# Patient Record
Sex: Female | Born: 1975 | Race: Black or African American | Hispanic: No | Marital: Married | Smoking: Former smoker
Health system: Southern US, Community
[De-identification: ages and names within clinical notes are randomized; demographics above are authoritative.]

## PROBLEM LIST (undated history)

## (undated) DIAGNOSIS — K219 Gastro-esophageal reflux disease without esophagitis: Secondary | ICD-10-CM

## (undated) DIAGNOSIS — F419 Anxiety disorder, unspecified: Secondary | ICD-10-CM

## (undated) DIAGNOSIS — I10 Essential (primary) hypertension: Secondary | ICD-10-CM

## (undated) HISTORY — DX: Anxiety disorder, unspecified: F41.9

## (undated) HISTORY — DX: Gastro-esophageal reflux disease without esophagitis: K21.9

---

## 2005-11-11 LAB — HM PAP SMEAR: HM PAP: NORMAL

## 2007-06-22 ENCOUNTER — Emergency Department: Payer: Self-pay | Admitting: Emergency Medicine

## 2012-03-22 ENCOUNTER — Emergency Department: Payer: Self-pay | Admitting: Emergency Medicine

## 2012-03-22 LAB — BASIC METABOLIC PANEL
Anion Gap: 6 — ABNORMAL LOW (ref 7–16)
BUN: 16 mg/dL (ref 7–18)
Co2: 28 mmol/L (ref 21–32)
EGFR (African American): >60
Osmolality: 285 (ref 275–301)

## 2012-03-22 LAB — URINALYSIS, COMPLETE
Bacteria: NONE SEEN
Blood: NEGATIVE
Glucose,UR: NEGATIVE mg/dL (ref 0–75)
Hyaline Cast: 4
Ketone: NEGATIVE
Leukocyte Esterase: NEGATIVE
Ph: 5 (ref 4.5–8.0)
Protein: 30
RBC,UR: 1 /HPF (ref 0–5)
Specific Gravity: 1.036 (ref 1.003–1.030)
Squamous Epithelial: 3
WBC UR: 2 /HPF (ref 0–5)

## 2012-03-22 LAB — CBC WITH DIFFERENTIAL/PLATELET
Basophil %: 0.7 %
Eosinophil #: 0.8 10*3/uL — ABNORMAL HIGH (ref 0.0–0.7)
HGB: 10.7 g/dL — ABNORMAL LOW (ref 12.0–16.0)
Lymphocyte %: 39.3 %
MCH: 28.9 pg (ref 26.0–34.0)
MCHC: 32.7 g/dL (ref 32.0–36.0)
MCV: 88 fL (ref 80–100)
Monocyte %: 4.6 %
Neutrophil #: 4.1 10*3/uL (ref 1.4–6.5)
Neutrophil %: 46.6 %
Platelet: 326 10*3/uL (ref 150–440)
RBC: 3.71 10*6/uL — ABNORMAL LOW (ref 3.80–5.20)
RDW: 14.1 % (ref 11.5–14.5)
WBC: 8.7 10*3/uL (ref 3.6–11.0)

## 2012-03-22 LAB — PREGNANCY, URINE: Pregnancy Test, Urine: NEGATIVE m[IU]/mL

## 2012-03-23 LAB — DRUG SCREEN, URINE
Barbiturates, Ur Screen: NEGATIVE (ref ?–200)
Benzodiazepine, Ur Scrn: POSITIVE (ref ?–200)
Cocaine Metabolite,Ur ~~LOC~~: NEGATIVE (ref ?–300)
MDMA (Ecstasy)Ur Screen: NEGATIVE (ref ?–500)
Methadone, Ur Screen: NEGATIVE (ref ?–300)
Opiate, Ur Screen: POSITIVE (ref ?–300)
Phencyclidine (PCP) Ur S: NEGATIVE (ref ?–25)
Tricyclic, Ur Screen: NEGATIVE (ref ?–1000)

## 2012-03-23 LAB — HEPATIC FUNCTION PANEL A (ARMC)
Alkaline Phosphatase: 103 U/L (ref 50–136)
Bilirubin, Direct: <0.1 mg/dL (ref 0.00–0.20)
SGOT(AST): 14 U/L — ABNORMAL LOW (ref 15–37)
Total Protein: 7 g/dL (ref 6.4–8.2)

## 2012-03-23 LAB — SEDIMENTATION RATE: Erythrocyte Sed Rate: 6 mm/hr (ref 0–20)

## 2012-04-23 ENCOUNTER — Emergency Department: Payer: Self-pay | Admitting: Emergency Medicine

## 2012-04-23 LAB — CBC
HCT: 30.8 % — ABNORMAL LOW (ref 35.0–47.0)
HGB: 10.4 g/dL — ABNORMAL LOW (ref 12.0–16.0)
MCV: 86 fL (ref 80–100)
RBC: 3.59 10*6/uL — ABNORMAL LOW (ref 3.80–5.20)
WBC: 8.5 10*3/uL (ref 3.6–11.0)

## 2012-04-23 LAB — COMPREHENSIVE METABOLIC PANEL
Albumin: 3.3 g/dL — ABNORMAL LOW (ref 3.4–5.0)
BUN: 12 mg/dL (ref 7–18)
Calcium, Total: 8.8 mg/dL (ref 8.5–10.1)
Creatinine: 0.92 mg/dL (ref 0.60–1.30)
Potassium: 3.6 mmol/L (ref 3.5–5.1)
Sodium: 138 mmol/L (ref 136–145)

## 2012-04-23 LAB — TROPONIN I: Troponin-I: <0.02 ng/mL

## 2012-05-11 ENCOUNTER — Emergency Department: Payer: Self-pay | Admitting: Emergency Medicine

## 2012-05-11 LAB — CBC WITH DIFFERENTIAL/PLATELET
Eosinophil #: 0.6 10*3/uL (ref 0.0–0.7)
Eosinophil %: 8 %
HGB: 11.2 g/dL — ABNORMAL LOW (ref 12.0–16.0)
Lymphocyte #: 2.8 10*3/uL (ref 1.0–3.6)
Lymphocyte %: 37 %
MCH: 28.4 pg (ref 26.0–34.0)
MCHC: 32.5 g/dL (ref 32.0–36.0)
MCV: 88 fL (ref 80–100)
Monocyte %: 5 %
Neutrophil #: 3.7 10*3/uL (ref 1.4–6.5)
RBC: 3.94 10*6/uL (ref 3.80–5.20)
WBC: 7.4 10*3/uL (ref 3.6–11.0)

## 2012-05-11 LAB — DRUG SCREEN, URINE
Amphetamines, Ur Screen: NEGATIVE (ref ?–1000)
Barbiturates, Ur Screen: NEGATIVE (ref ?–200)
Benzodiazepine, Ur Scrn: NEGATIVE (ref ?–200)
Cannabinoid 50 Ng, Ur ~~LOC~~: NEGATIVE (ref ?–50)
Cocaine Metabolite,Ur ~~LOC~~: NEGATIVE (ref ?–300)
MDMA (Ecstasy)Ur Screen: NEGATIVE (ref ?–500)
Methadone, Ur Screen: NEGATIVE (ref ?–300)
Tricyclic, Ur Screen: NEGATIVE (ref ?–1000)

## 2012-05-11 LAB — URINALYSIS, COMPLETE
Bilirubin,UR: NEGATIVE
Glucose,UR: NEGATIVE mg/dL (ref 0–75)
Hyaline Cast: 28
Ketone: NEGATIVE
Ph: 8 (ref 4.5–8.0)
Squamous Epithelial: 31

## 2012-05-11 LAB — COMPREHENSIVE METABOLIC PANEL
BUN: 11 mg/dL (ref 7–18)
Bilirubin,Total: 0.3 mg/dL (ref 0.2–1.0)
Calcium, Total: 9.2 mg/dL (ref 8.5–10.1)
Chloride: 105 mmol/L (ref 98–107)
Co2: 23 mmol/L (ref 21–32)
Creatinine: 1.16 mg/dL (ref 0.60–1.30)
EGFR (African American): >60
EGFR (Non-African Amer.): 60 — ABNORMAL LOW
Osmolality: 278 (ref 275–301)

## 2012-05-11 LAB — PREGNANCY, URINE: Pregnancy Test, Urine: NEGATIVE m[IU]/mL

## 2015-09-23 ENCOUNTER — Other Ambulatory Visit: Payer: Self-pay | Admitting: Family Medicine

## 2015-09-29 ENCOUNTER — Other Ambulatory Visit: Payer: Self-pay | Admitting: Family Medicine

## 2015-09-30 ENCOUNTER — Other Ambulatory Visit: Payer: Self-pay | Admitting: Family Medicine

## 2016-11-14 DIAGNOSIS — I639 Cerebral infarction, unspecified: Secondary | ICD-10-CM

## 2016-11-14 HISTORY — DX: Cerebral infarction, unspecified: I63.9

## 2017-05-09 ENCOUNTER — Inpatient Hospital Stay (HOSPITAL_COMMUNITY)
Admission: AD | Admit: 2017-05-09 | Discharge: 2017-05-12 | DRG: 065 | Disposition: A | Discharge status: Home / Self Care | Payer: Medicaid Other | Source: Other Acute Inpatient Hospital | Attending: Neurology | Admitting: Neurology

## 2017-05-09 ENCOUNTER — Emergency Department: Payer: Medicaid Other

## 2017-05-09 ENCOUNTER — Encounter (HOSPITAL_COMMUNITY): Payer: Self-pay

## 2017-05-09 ENCOUNTER — Encounter: Payer: Self-pay | Admitting: Emergency Medicine

## 2017-05-09 ENCOUNTER — Emergency Department
Admission: EM | Admit: 2017-05-09 | Discharge: 2017-05-09 | Disposition: A | Discharge status: Other Acute Inpatient Hospital | Payer: Medicaid Other | Attending: Emergency Medicine | Admitting: Emergency Medicine

## 2017-05-09 DIAGNOSIS — I161 Hypertensive emergency: Secondary | ICD-10-CM | POA: Diagnosis not present

## 2017-05-09 DIAGNOSIS — F17210 Nicotine dependence, cigarettes, uncomplicated: Secondary | ICD-10-CM | POA: Diagnosis present

## 2017-05-09 DIAGNOSIS — I610 Nontraumatic intracerebral hemorrhage in hemisphere, subcortical: Secondary | ICD-10-CM

## 2017-05-09 DIAGNOSIS — I619 Nontraumatic intracerebral hemorrhage, unspecified: Secondary | ICD-10-CM | POA: Insufficient documentation

## 2017-05-09 DIAGNOSIS — E785 Hyperlipidemia, unspecified: Secondary | ICD-10-CM | POA: Diagnosis not present

## 2017-05-09 DIAGNOSIS — Z8249 Family history of ischemic heart disease and other diseases of the circulatory system: Secondary | ICD-10-CM | POA: Diagnosis not present

## 2017-05-09 DIAGNOSIS — I10 Essential (primary) hypertension: Secondary | ICD-10-CM | POA: Diagnosis not present

## 2017-05-09 DIAGNOSIS — R4781 Slurred speech: Secondary | ICD-10-CM | POA: Diagnosis not present

## 2017-05-09 DIAGNOSIS — T448X6A Underdosing of centrally-acting and adrenergic-neuron-blocking agents, initial encounter: Secondary | ICD-10-CM | POA: Diagnosis present

## 2017-05-09 DIAGNOSIS — Y92009 Unspecified place in unspecified non-institutional (private) residence as the place of occurrence of the external cause: Secondary | ICD-10-CM | POA: Diagnosis not present

## 2017-05-09 DIAGNOSIS — E119 Type 2 diabetes mellitus without complications: Secondary | ICD-10-CM | POA: Diagnosis present

## 2017-05-09 DIAGNOSIS — G8194 Hemiplegia, unspecified affecting left nondominant side: Secondary | ICD-10-CM | POA: Diagnosis present

## 2017-05-09 DIAGNOSIS — I639 Cerebral infarction, unspecified: Secondary | ICD-10-CM

## 2017-05-09 DIAGNOSIS — R29704 NIHSS score 4: Secondary | ICD-10-CM | POA: Diagnosis not present

## 2017-05-09 DIAGNOSIS — Z6841 Body Mass Index (BMI) 40.0 and over, adult: Secondary | ICD-10-CM | POA: Diagnosis not present

## 2017-05-09 DIAGNOSIS — R29810 Facial weakness: Secondary | ICD-10-CM | POA: Diagnosis present

## 2017-05-09 HISTORY — DX: Essential (primary) hypertension: I10

## 2017-05-09 LAB — COMPREHENSIVE METABOLIC PANEL
ALT: 9 U/L — ABNORMAL LOW (ref 14–54)
AST: 18 U/L (ref 15–41)
Albumin: 4 g/dL (ref 3.5–5.0)
Alkaline Phosphatase: 90 U/L (ref 38–126)
Anion gap: 8 (ref 5–15)
BILIRUBIN TOTAL: 0.4 mg/dL (ref 0.3–1.2)
BUN: 11 mg/dL (ref 6–20)
CO2: 24 mmol/L (ref 22–32)
Calcium: 9.1 mg/dL (ref 8.9–10.3)
Chloride: 105 mmol/L (ref 101–111)
Creatinine, Ser: 0.87 mg/dL (ref 0.44–1.00)
GFR calc Af Amer: >60 mL/min (ref >60)
Glucose, Bld: 102 mg/dL — ABNORMAL HIGH (ref 65–99)
POTASSIUM: 2.8 mmol/L — AB (ref 3.5–5.1)
Sodium: 137 mmol/L (ref 135–145)
TOTAL PROTEIN: 8 g/dL (ref 6.5–8.1)

## 2017-05-09 LAB — CBC
HEMATOCRIT: 31.7 % — AB (ref 35.0–47.0)
HEMOGLOBIN: 10.9 g/dL — AB (ref 12.0–16.0)
MCH: 29.6 pg (ref 26.0–34.0)
MCHC: 34.5 g/dL (ref 32.0–36.0)
MCV: 86 fL (ref 80.0–100.0)
Platelets: 302 10*3/uL (ref 150–440)
RBC: 3.69 MIL/uL — ABNORMAL LOW (ref 3.80–5.20)
RDW: 15.3 % — ABNORMAL HIGH (ref 11.5–14.5)
WBC: 6.5 10*3/uL (ref 3.6–11.0)

## 2017-05-09 LAB — DIFFERENTIAL
BASOS ABS: 0 10*3/uL (ref 0–0.1)
Basophils Relative: 1 %
EOS PCT: 10 %
Eosinophils Absolute: 0.6 10*3/uL (ref 0–0.7)
LYMPHS ABS: 1.6 10*3/uL (ref 1.0–3.6)
Lymphocytes Relative: 24 %
MONOS PCT: 7 %
Monocytes Absolute: 0.4 10*3/uL (ref 0.2–0.9)
Neutro Abs: 3.8 10*3/uL (ref 1.4–6.5)
Neutrophils Relative %: 58 %

## 2017-05-09 LAB — TROPONIN I

## 2017-05-09 LAB — GLUCOSE, CAPILLARY: Glucose-Capillary: 110 mg/dL — ABNORMAL HIGH (ref 65–99)

## 2017-05-09 LAB — APTT: aPTT: 25 seconds (ref 24–36)

## 2017-05-09 LAB — PROTIME-INR
INR: 1.01
Prothrombin Time: 13.3 seconds (ref 11.4–15.2)

## 2017-05-09 LAB — MRSA PCR SCREENING: MRSA by PCR: NEGATIVE

## 2017-05-09 MED ORDER — NICARDIPINE HCL IN NACL 20-0.86 MG/200ML-% IV SOLN: 3.0000 mg/h | Freq: Once | INTRAVENOUS | Status: DC

## 2017-05-09 MED ORDER — STROKE: EARLY STAGES OF RECOVERY BOOK
Freq: Once | Status: AC
  Administered 2017-05-09: 17:00:00
  Filled 2017-05-09: qty 1

## 2017-05-09 MED ORDER — NICARDIPINE HCL IN NACL 40-0.83 MG/200ML-% IV SOLN
3.0000 mg/h | INTRAVENOUS | Status: DC
Start: 2017-05-09 — End: 2017-05-11
  Administered 2017-05-09 – 2017-05-10 (×4): 15 mg/h via INTRAVENOUS
  Administered 2017-05-10: 9 mg/h via INTRAVENOUS
  Filled 2017-05-09 (×6): qty 200

## 2017-05-09 MED ORDER — LABETALOL HCL 5 MG/ML IV SOLN
INTRAVENOUS | Status: AC
  Filled 2017-05-09: qty 4

## 2017-05-09 MED ORDER — SODIUM CHLORIDE 0.9 % IV SOLN: INTRAVENOUS | Status: DC

## 2017-05-09 MED ORDER — LABETALOL HCL 5 MG/ML IV SOLN
10.0000 mg | INTRAVENOUS | Status: DC | PRN
  Administered 2017-05-09 – 2017-05-12 (×7): 10 mg via INTRAVENOUS
  Filled 2017-05-09 (×6): qty 4

## 2017-05-09 MED ORDER — SENNOSIDES-DOCUSATE SODIUM 8.6-50 MG PO TABS
1.0000 | ORAL_TABLET | Freq: Two times a day (BID) | ORAL | Status: DC
  Administered 2017-05-09 – 2017-05-12 (×6): 1 via ORAL
  Filled 2017-05-09 (×7): qty 1

## 2017-05-09 MED ORDER — CLEVIDIPINE BUTYRATE 0.5 MG/ML IV EMUL: 0.0000 mg/h | INTRAVENOUS | Status: DC

## 2017-05-09 MED ORDER — ACETAMINOPHEN 650 MG RE SUPP: 650.0000 mg | RECTAL | Status: DC | PRN

## 2017-05-09 MED ORDER — CLONIDINE HCL 0.1 MG PO TABS: 0.1000 mg | ORAL_TABLET | Freq: Two times a day (BID) | ORAL | Status: DC

## 2017-05-09 MED ORDER — LABETALOL HCL 5 MG/ML IV SOLN
20.0000 mg | Freq: Once | INTRAVENOUS | Status: AC
  Administered 2017-05-09: 20 mg via INTRAVENOUS

## 2017-05-09 MED ORDER — ACETAMINOPHEN 160 MG/5ML PO SOLN: 650.0000 mg | ORAL | Status: DC | PRN

## 2017-05-09 MED ORDER — ACETAMINOPHEN 325 MG PO TABS: 650.0000 mg | ORAL_TABLET | ORAL | Status: DC | PRN

## 2017-05-09 MED ORDER — NICARDIPINE HCL IN NACL 20-0.86 MG/200ML-% IV SOLN
INTRAVENOUS | Status: AC
  Filled 2017-05-09: qty 200

## 2017-05-09 MED ORDER — PANTOPRAZOLE SODIUM 40 MG IV SOLR: 40.0000 mg | Freq: Every day | INTRAVENOUS | Status: DC

## 2017-05-09 MED ORDER — NICARDIPINE HCL IN NACL 20-0.86 MG/200ML-% IV SOLN
3.0000 mg/h | INTRAVENOUS | Status: DC
  Administered 2017-05-09: 5 mg/h via INTRAVENOUS
  Administered 2017-05-09 (×2): 15 mg/h via INTRAVENOUS
  Filled 2017-05-09: qty 200

## 2017-05-09 MED ORDER — NICARDIPINE HCL IN NACL 20-0.86 MG/200ML-% IV SOLN
3.0000 mg/h | INTRAVENOUS | Status: DC
Start: 2017-05-09 — End: 2017-05-09
  Administered 2017-05-09: 15 mg/h via INTRAVENOUS

## 2017-05-09 MED ORDER — CLONIDINE HCL 0.1 MG PO TABS
0.1000 mg | ORAL_TABLET | Freq: Two times a day (BID) | ORAL | Status: DC
  Administered 2017-05-09: 0.1 mg via ORAL
  Filled 2017-05-09: qty 1

## 2017-05-09 MED ORDER — NICARDIPINE HCL IN NACL 20-0.86 MG/200ML-% IV SOLN
INTRAVENOUS | Status: AC
  Administered 2017-05-09: 5 mg/h via INTRAVENOUS
  Filled 2017-05-09: qty 200

## 2017-05-09 MED ORDER — PANTOPRAZOLE SODIUM 40 MG PO TBEC
40.0000 mg | DELAYED_RELEASE_TABLET | Freq: Every day | ORAL | Status: DC
  Administered 2017-05-09 – 2017-05-12 (×4): 40 mg via ORAL
  Filled 2017-05-09 (×4): qty 1

## 2017-05-09 MED ORDER — LABETALOL HCL 5 MG/ML IV SOLN
10.0000 mg | Freq: Once | INTRAVENOUS | Status: AC
  Administered 2017-05-09: 10 mg via INTRAVENOUS
  Filled 2017-05-09: qty 4

## 2017-05-09 MED ORDER — NICARDIPINE HCL IN NACL 20-0.86 MG/200ML-% IV SOLN
INTRAVENOUS | Status: AC
  Administered 2017-05-09: 20 mg
  Filled 2017-05-09: qty 200

## 2017-05-09 MED ORDER — LORAZEPAM 2 MG/ML IJ SOLN
1.0000 mg | Freq: Once | INTRAMUSCULAR | Status: AC
  Administered 2017-05-09: 1 mg via INTRAVENOUS
  Filled 2017-05-09: qty 1
  Filled 2017-05-09 (×2): qty 0.5

## 2017-05-09 NOTE — ED Notes (Signed)
Dr Thad Rangerreynolds notified of bp 193/99 on 15 ml/hr of cardene for 15 min. Orders for 10 mg labetalol given

## 2017-05-09 NOTE — Progress Notes (Signed)
Dr. Cherylynn RidgesShikhman called back and said it is ok not to treat the blood pressure in the 140's.

## 2017-05-09 NOTE — Progress Notes (Signed)
Patient admitted from [facility]. Patient alert and oriented x 4. Patient oriented to room and made comfortable. MD page to notify that patient is on the unit.

## 2017-05-09 NOTE — ED Notes (Signed)
From CT to room. Dr Doy Mince met pt at [place]

## 2017-05-09 NOTE — ED Notes (Signed)
Report given to [facility]. 

## 2017-05-09 NOTE — Progress Notes (Signed)
[facility] Physician-Brief Progress Note Patient Name: Diane Keller DOB: 08/20/1976 MRN: [id number]   Date of Service  05/09/2017  HPI/Events of Note  41 yo woma with poorly controlled HTN, admitted with ICU. Currently stable on camera, BP 136/88. No new interventions for now  eICU Interventions       Intervention Category Evaluation Type: New Patient Evaluation  [name] S. 05/09/2017, 3:51 PM

## 2017-05-09 NOTE — Consult Note (Signed)
Neurosurgery-New Consultation Evaluation 05/09/2017 Diane Keller [id number]  Identifying Statement: Diane Keller is a 41 y.o. female from [place] [address] with intracranial hemorrhage  Physician Requesting Consultation: Daryel November, MD - Emergency Department  History of Present Illness: Diane Keller presented to ED today after having slurred speech this morning. She states she noticed this only this morning and then noted she had some facial weakness. She has no history of similar symptoms. She also noted some weakness on the left side of her body but denies numbness. She denies any headache  She has no history of anticoagulant or antiplatelet use. She has no history of liver disease or bleeding disorder. She was found to be hypertensive on arrival to the ED. She does smoke occasionally but denies alcohol or other drug use.  CT imaging of the head revealed a small hemorrhage on the right in the subcortical/basal ganglia area.   Past Medical History:  Past Medical History:  Diagnosis Date  . Hypertension     Social History: Social History   Social History  . Marital status: Single    Spouse name: N/A  . Number of children: N/A  . Years of education: N/A   Occupational History  . Not on file.   Social History Main Topics  . Smoking status: Current Every Day Smoker  . Smokeless tobacco: Never Used  . Alcohol use Yes  . Drug use: No  . Sexual activity: Not on file   Other Topics Concern  . Not on file   Social History Narrative  . No narrative on file   Living arrangements (living alone, with partner): Lives in [place]  Family History: History reviewed. No pertinent family history.  Review of Systems:  Review of Systems - General ROS: Negative Psychological ROS: Negative Ophthalmic ROS: Negative ENT ROS: Negative Hematological and Lymphatic ROS: Negative  Endocrine ROS: Negative Respiratory ROS: Negative Cardiovascular ROS:  Negative Gastrointestinal ROS: Negative Genito-Urinary ROS: Negative Musculoskeletal ROS: Negative Neurological ROS: Negative for headache, Positive for weakness. Negative for numbness Dermatological ROS: Negative  Physical Exam: BP (!) 147/84   Pulse 90   Temp 98.4 F (36.9 C) (Oral)   Resp (!) 28   Ht 5\' 8"  (1.727 m)   Wt 113.4 kg (250 lb)   LMP 05/04/2017   SpO2 94%   BMI 38.01 kg/m  Body mass index is 38.01 kg/m. Body surface area is 2.33 meters squared. General appearance: Alert, cooperative, lying supine, appears anxious Head: Normocephalic, atraumatic Eyes: Normal, EOM intact Oropharynx: Moist without lesions Ext: No edema in LE bilaterally  Neurologic exam:  Mental status: alertness: alert, orientation: person, place, time, affect: normal Speech: fluent but mild dysarthria, naming intact Cranial nerves:  II: Visual fields are full by confrontation III/IV/VI: extra-ocular motions intact bilaterally V/VII: left lower face droop and facial sensation intact VIII: hearing normal XII: tongue deviation to the left  Motor: Strength 4-4+/6 in left upper extremity, 4+ in lower extremity. Right side is full strength Sensory: intact to light touch in all extremities Gait: not tested  Laboratory: Results for orders placed or performed during the hospital encounter of 05/09/17  Protime-INR  Result Value Ref Range   Prothrombin Time 13.3 11.4 - 15.2 seconds   INR 1.01   APTT  Result Value Ref Range   aPTT 25 24 - 36 seconds  CBC  Result Value Ref Range   WBC 6.5 3.6 - 11.0 K/uL   RBC 3.69 (L) 3.80 - 5.20 MIL/uL   Hemoglobin  10.9 (L) 12.0 - 16.0 g/dL   HCT 82.931.7 (L) 56.235.0 - 13.047.0 %   MCV 86.0 80.0 - 100.0 fL   MCH 29.6 26.0 - 34.0 pg   MCHC 34.5 32.0 - 36.0 g/dL   RDW 86.515.3 (H) 78.411.5 - 69.614.5 %   Platelets 302 150 - 440 K/uL  Differential  Result Value Ref Range   Neutrophils Relative % 58 %   Neutro Abs 3.8 1.4 - 6.5 K/uL   Lymphocytes Relative 24 %   Lymphs Abs 1.6  1.0 - 3.6 K/uL   Monocytes Relative 7 %   Monocytes Absolute 0.4 0.2 - 0.9 K/uL   Eosinophils Relative 10 %   Eosinophils Absolute 0.6 0 - 0.7 K/uL   Basophils Relative 1 %   Basophils Absolute 0.0 0 - 0.1 K/uL  Comprehensive metabolic panel  Result Value Ref Range   Sodium 137 135 - 145 mmol/L   Potassium 2.8 (L) 3.5 - 5.1 mmol/L   Chloride 105 101 - 111 mmol/L   CO2 24 22 - 32 mmol/L   Glucose, Bld 102 (H) 65 - 99 mg/dL   BUN 11 6 - 20 mg/dL   Creatinine, Ser 2.950.87 0.44 - 1.00 mg/dL   Calcium 9.1 8.9 - 28.410.3 mg/dL   Total Protein 8.0 6.5 - 8.1 g/dL   Albumin 4.0 3.5 - 5.0 g/dL   AST 18 15 - 41 U/L   ALT 9 (L) 14 - 54 U/L   Alkaline Phosphatase 90 38 - 126 U/L   Total Bilirubin 0.4 0.3 - 1.2 mg/dL   GFR calc non Af Amer >60 >60 mL/min   GFR calc Af Amer >60 >60 mL/min   Anion gap 8 5 - 15  Troponin I  Result Value Ref Range   Troponin I <0.03 <0.03 ng/mL  Glucose, capillary  Result Value Ref Range   Glucose-Capillary 110 (H) 65 - 99 mg/dL   I personally reviewed labs  Imaging:  CT Head: Small hyperdensity in right subcortical area with some mild edema consistent with a cute to subacute hemorrhage. There is no mass effect and no hydrocephalus.    Impression/Plan:  Diane Keller is here with a likely hemorrhagic stroke secondary to hypertensive crisis. The ED is currently lowering her blood pressure with medications and labs show no concern for coagulopathy. Her weakness is explained by the hemorrhage location. There is no emergent neurosurgical intervention needed but do recommend observation in ICU setting for blood pressure control and neurological checks. She will need a repeat CT scan in 6-8 hours to look for progression and transfer to higher level facility for neurological stroke care is reasonable. .    1.  Diagnosis: Hemorrhagic stroke  2.  Plan - No emergent neurosurgical need - Recommend BP management and repeat scan in 6 hours.

## 2017-05-09 NOTE — ED Notes (Signed)
Pt  Went  To  [facility]  ALL  PAPERWORK GIVEN  TO  [facility]  STAFF

## 2017-05-09 NOTE — Consult Note (Addendum)
Referring Physician: Mayford KnifeWilliams    Chief Complaint: Left facial droop, slurred speech, ataxia  HPI: Diane Keller is an 41 y.o. female with a history of HTN, noncompliant with medications, who reports awakening this morning and feeling off balance.  Dropped many things from her left hand.  Went to the kitchen where she was having difficulty as well and called her husband.  While on the phone noted that her speech was slurred and that the left side of her mouth was not moving normally.  Patient presented at that time.  Initial NIHSS of 4.    Date last known well: Date: 05/09/2017 Time last known well: Time: 02:00 tPA Given: No: ICH  ICH Score: 0       Past Medical History:  Diagnosis Date  . Hypertension     Past Surgical History:  Procedure Laterality Date  . CESAREAN SECTION      Family history: Parents with hypertension.  Father deceased from MI.  Mother still living.    Social History:  reports that she has been smoking.  She has never used smokeless tobacco. She reports that she drinks alcohol. She reports that she does not use drugs.  Allergies: No Known Allergies  Medications: I have reviewed the patient's current medications. Prior to Admission:  Prior to Admission medications   Medication Sig Start Date End Date Taking? Authorizing Provider  Buprenorphine HCl-Naloxone HCl (SUBOXONE) 8-2 MG FILM Place 2 Film under the tongue daily.   Yes [provider]  cloNIDine (CATAPRES) 0.1 MG tablet TAKE 1 TABLET TWICE A DAY 10/01/15  Yes Anola Gurneyhauvin, Robert, PA  omeprazole (PRILOSEC) 20 MG capsule TAKE 1 CAPSULE EVERY DAY Patient not taking: Reported on 05/09/2017 09/29/15   Anola Gurneyhauvin, Robert, PA   ROS: History obtained from the patient  General ROS: negative for - chills, fatigue, fever, night sweats, weight gain or weight loss Psychological ROS: negative for - behavioral disorder, hallucinations, memory difficulties, mood swings or suicidal ideation Ophthalmic ROS:  negative for - blurry vision, double vision, eye pain or loss of vision ENT ROS: negative for - epistaxis, nasal discharge, oral lesions, sore throat, tinnitus or vertigo Allergy and Immunology ROS: negative for - hives or itchy/watery eyes Hematological and Lymphatic ROS: negative for - bleeding problems, bruising or swollen lymph nodes Endocrine ROS: negative for - galactorrhea, hair pattern changes, polydipsia/polyuria or temperature intolerance Respiratory ROS: negative for - cough, hemoptysis, shortness of breath or wheezing Cardiovascular ROS: negative for - chest pain, dyspnea on exertion, edema or irregular heartbeat Gastrointestinal ROS: negative for - abdominal pain, diarrhea, hematemesis, nausea/vomiting or stool incontinence Genito-Urinary ROS: negative for - dysuria, hematuria, incontinence or urinary frequency/urgency Musculoskeletal ROS: negative for - joint swelling or muscular weakness Neurological ROS: as noted in HPI Dermatological ROS: negative for rash and skin lesion changes  Physical Examination: Blood pressure (!) 186/112, pulse (!) 114, temperature 98.4 F (36.9 C), temperature source Oral, resp. rate 20, height 5\' 8"  (1.727 m), weight 113.4 kg (250 lb), last menstrual period 05/04/2017, SpO2 99 %.  HEENT-  Normocephalic, no lesions, without obvious abnormality.  Normal external eye and conjunctiva.  Normal TM's bilaterally.  Normal auditory canals and external ears. Normal external nose, mucus membranes and septum.  Normal pharynx. Cardiovascular- S1, S2 normal, pulses palpable throughout   Lungs- chest clear, no wheezing, rales, normal symmetric air entry Abdomen- soft, non-tender; bowel sounds normal; no masses,  no organomegaly Extremities- no edema Lymph-no adenopathy palpable Musculoskeletal-no joint tenderness, deformity or swelling Skin-warm and  dry, no hyperpigmentation, vitiligo, or suspicious lesions  Neurological Examination   Mental Status: Alert,  oriented, thought content appropriate.  Speech fluent without evidence of aphasia.  Dysarthria.  Able to follow 3 step commands without difficulty. Cranial Nerves: II: Discs flat bilaterally; Visual fields grossly normal, pupils equal, round, reactive to light and accommodation III,IV, VI: ptosis not present, extra-ocular motions intact bilaterally V,VII: left facial droop, facial light touch sensation normal bilaterally VIII: hearing normal bilaterally IX,X: gag reflex present XI: bilateral shoulder shrug XII: left tongue deviation Motor: Right : Upper extremity   5/5    Left:     Upper extremity   5/5  Lower extremity   5-/5     Lower extremity   5-/5 Tone and bulk:normal tone throughout; no atrophy noted Sensory: Pinprick and light touch intact throughout, bilaterally Deep Tendon Reflexes: 2+ and symmetric throughout Plantars: Right: downgoing   Left: downgoing Cerebellar: Mild dysmetria with left finger-to-nose testing and normal heel-to-shin testing bilaterally Gait: not tested due to safety concerns    Laboratory Studies:  Basic Metabolic Panel: No results for input(s): NA, K, CL, CO2, GLUCOSE, BUN, CREATININE, CALCIUM, MG, PHOS in the last 168 hours.  Liver Function Tests: No results for input(s): AST, ALT, ALKPHOS, BILITOT, PROT, ALBUMIN in the last 168 hours. No results for input(s): LIPASE, AMYLASE in the last 168 hours. No results for input(s): AMMONIA in the last 168 hours.  CBC:  Recent Labs Lab 05/09/17 1027  WBC 6.5  NEUTROABS 3.8  HGB 10.9*  HCT 31.7*  MCV 86.0  PLT 302    Cardiac Enzymes: No results for input(s): CKTOTAL, CKMB, CKMBINDEX, TROPONINI in the last 168 hours.  BNP: Invalid input(s): POCBNP  CBG:  Recent Labs Lab 05/09/17 1043  GLUCAP 110*    Microbiology: No results found for this or any previous visit.  Coagulation Studies:  Recent Labs  05/09/17 1027  LABPROT 13.3  INR 1.01    Urinalysis: No results for input(s):  COLORURINE, LABSPEC, PHURINE, GLUCOSEU, HGBUR, BILIRUBINUR, KETONESUR, PROTEINUR, UROBILINOGEN, NITRITE, LEUKOCYTESUR in the last 168 hours.  Invalid input(s): APPERANCEUR  Lipid Panel: No results found for: CHOL, TRIG, HDL, CHOLHDL, VLDL, LDLCALC  HgbA1C: No results found for: HGBA1C  Urine Drug Screen:     Component Value Date/Time   LABOPIA POSITIVE 05/11/2012 1513   COCAINSCRNUR NEGATIVE 05/11/2012 1513   LABBENZ NEGATIVE 05/11/2012 1513   AMPHETMU NEGATIVE 05/11/2012 1513   THCU NEGATIVE 05/11/2012 1513   LABBARB NEGATIVE 05/11/2012 1513    Alcohol Level: No results for input(s): ETH in the last 168 hours.  Other results: EKG: sinus tachycardia at 103 bpm.  Imaging: Ct Head Code Stroke W/o Cm  Result Date: 05/09/2017 CLINICAL DATA:  Code stroke. Sudden onset of left-sided facial droop and slurred speech 1 hour ago. EXAM: CT HEAD WITHOUT CONTRAST TECHNIQUE: Contiguous axial images were obtained from the base of the skull through the vertex without intravenous contrast. COMPARISON:  05/11/2012 FINDINGS: Brain: There is an acute/ early subacute hemorrhage in the right basal ganglia region measuring approximately 1.9 x 2 x 2.5 cm (volume = 5 cm^3). There is a thin rim of surrounding edema suggesting that this may be 57-9 days old. No mass effect or shift. This is most likely a hypertensive hemorrhage or hemorrhagic infarction. Elsewhere, the brain parenchyma appears normal. No evidence of other stroke. No evidence of neoplastic mass lesion, hydrocephalus or extra-axial collection. Small calcification along the age of the fourth ventricle probably represents a benign subependymal  calcification. Small dural calcification in the right occipital region is benign and insignificant. Vascular: No abnormal vascular finding. Skull: Normal Sinuses/Orbits: Mucosal inflammatory disease affecting the paranasal sinuses. Orbits negative. Other: None IMPRESSION: 1. 1.9 x 2 x 2.5 cm hemorrhage in the right  basal ganglia/ external capsule region with a thin rim of surrounding edema, suggesting that this may be 70-20 days old. No mass-effect or shift. Most typical of either a hypertensive hemorrhage or hemorrhagic infarction. The remainder the brain is negative. 2. No evidence of any large vessel occlusion findings. These results were called by telephone at the time of interpretation on 05/09/2017 at 10:45 am to Dr. Roxan Hockey , who verbally acknowledged these results. Electronically Signed   By: Paulina Fusi M.D.   On: 05/09/2017 10:52    Assessment: 41 y.o. female presenting after awakening with left facial droop, slurred speech and difficulty with gait.  Head CT reviewed and shows a right BG hemorrhage.  Patient therefore not a tPA candidate.  Has a history of hypertension which is the likely etiology of her hemorrhage.  Patient is noncompliant.  BP elevated on presentation.    Stroke Risk Factors - hypertension and smoking  Plan: 1. HgbA1c, fasting lipid panel 2. MRI, MRA  of the brain without contrast 3. Patient accepted in transfer to [facility] 4. NPO until RN stroke swallow screen 5. Telemetry monitoring 6. Frequent neuro checks 7. BP control with target SBP<160  This patient is critically ill and at significant risk of neurological worsening, death and care requires constant monitoring of vital signs, hemodynamics,respiratory and cardiac monitoring, neurological assessment, discussion with family, other specialists and medical decision making of high complexity. I spent 40 minutes of neurocritical care time  in the care of  this patient.   Thana Farr, MD Neurology [phone] 05/09/2017, 11:00 AM

## 2017-05-09 NOTE — H&P (Signed)
History and physical      History obtained from:   Patient     HPI:                                                                                                                                         Diane Keller is an 41 y.o. female  with a history of HTN, noncompliant with medications, who reports awakening this morning and feeling off balance.  Dropped many things from her left hand.  Went to the kitchen where she was having difficulty as well and called her husband.  While on the phone noted that her speech was slurred and that the left side of her mouth was not moving normally.  Patient presented at that time.  Initial NIHSS of 4.    Date last known well: Date: 05/09/2017 Time last known well: Time: 02:00 tPA Given: No: ICH  ICH Score: 0     Past Medical History:  Diagnosis Date  . Hypertension     Past Surgical History:  Procedure Laterality Date  . CESAREAN SECTION     Family history: Parents with hypertension.  Father deceased from MI.  Mother still living.    Social History:  reports that she has been smoking.  She has never used smokeless tobacco. She reports that she drinks alcohol. She reports that she does not use drugs.  Allergies: No Known Allergies   Medications:                                                                                                                          reviewed  ROS:  History obtained from the patient  General ROS: negative for - chills, fatigue, fever, night sweats, weight gain or weight loss Psychological ROS: negative for - behavioral disorder, hallucinations, memory difficulties, mood swings or suicidal ideation Ophthalmic ROS: negative for - blurry vision, double vision, eye pain or loss of vision ENT ROS: negative for - epistaxis, nasal discharge, oral lesions, sore throat,  tinnitus or vertigo Allergy and Immunology ROS: negative for - hives or itchy/watery eyes Hematological and Lymphatic ROS: negative for - bleeding problems, bruising or swollen lymph nodes Endocrine ROS: negative for - galactorrhea, hair pattern changes, polydipsia/polyuria or temperature intolerance Respiratory ROS: negative for - cough, hemoptysis, shortness of breath or wheezing Cardiovascular ROS: negative for - chest pain, dyspnea on exertion, edema or irregular heartbeat Gastrointestinal ROS: negative for - abdominal pain, diarrhea, hematemesis, nausea/vomiting or stool incontinence Genito-Urinary ROS: negative for - dysuria, hematuria, incontinence or urinary frequency/urgency Musculoskeletal ROS: negative for - joint swelling or muscular weakness Neurological ROS: as noted in HPI Dermatological ROS: negative for rash and skin lesion changes  Neurologic Examination:                                                                                                      Blood pressure 140/82, pulse (!) 104, temperature 98.6 F (37 C), temperature source Oral, resp. rate (!) 23, height 5\' 6"  (1.676 m), weight 128.9 kg (284 lb 2.8 oz), last menstrual period 05/04/2017, SpO2 96 %.  HEENT-  Normocephalic, no lesions, without obvious abnormality.  Normal external eye and conjunctiva.  Normal TM's bilaterally.  Normal auditory canals and external ears. Normal external nose, mucus membranes and septum.  Normal pharynx. Cardiovascular- S1, S2 normal, pulses palpable throughout   Lungs- chest clear, no wheezing, rales, normal symmetric air entry Abdomen- normal findings: bowel sounds normal Extremities- no joint deformities, effusion, or inflammation Lymph-no adenopathy palpable Musculoskeletal-no joint tenderness, deformity or swelling Skin-warm and dry, no hyperpigmentation, vitiligo, or suspicious lesions  Neurological Examination   Mental Status: Alert, oriented, thought content appropriate.   Speech fluent without evidence of aphasia.  Dysarthria.  Able to follow 3 step commands without difficulty. Cranial Nerves: II:  Visual fields grossly normal, pupils equal, round, reactive to light and accommodation III,IV, VI: ptosis not present, extra-ocular motions intact bilaterally V,VII: left facial droop, facial light touch sensation normal bilaterally VIII: hearing normal bilaterally IX,X: gag reflex present XI: bilateral shoulder shrug XII: left tongue deviation Motor: Right :  Upper extremity   5/5                                      Left:     Upper extremity   5/5             Lower extremity   5-/5  Lower extremity   5-/5 Tone and bulk:normal tone throughout; no atrophy noted Sensory: Pinprick and light touch intact throughout, bilaterally Deep Tendon Reflexes: 2+ and symmetric throughout Plantars: Right: downgoing                                Left: downgoing Cerebellar: Mild dysmetria with left finger-to-nose testing and normal heel-to-shin testing bilaterally Gait: not tested due to safety concerns       Lab Results: Basic Metabolic Panel:  Recent Labs Lab 05/09/17 1027  NA 137  K 2.8*  CL 105  CO2 24  GLUCOSE 102*  BUN 11  CREATININE 0.87  CALCIUM 9.1    Liver Function Tests:  Recent Labs Lab 05/09/17 1027  AST 18  ALT 9*  ALKPHOS 90  BILITOT 0.4  PROT 8.0  ALBUMIN 4.0   No results for input(s): LIPASE, AMYLASE in the last 168 hours. No results for input(s): AMMONIA in the last 168 hours.  CBC:  Recent Labs Lab 05/09/17 1027  WBC 6.5  NEUTROABS 3.8  HGB 10.9*  HCT 31.7*  MCV 86.0  PLT 302    Cardiac Enzymes:  Recent Labs Lab 05/09/17 1027  TROPONINI <0.03    Lipid Panel: No results for input(s): CHOL, TRIG, HDL, CHOLHDL, VLDL, LDLCALC in the last 168 hours.  CBG:  Recent Labs Lab 05/09/17 1043  GLUCAP 110*    Microbiology: No results found for this or any previous  visit.  Coagulation Studies:  Recent Labs  05/09/17 1027  LABPROT 13.3  INR 1.01    Imaging: Ct Head Code Stroke W/o Cm  Result Date: 05/09/2017 CLINICAL DATA:  Code stroke. Sudden onset of left-sided facial droop and slurred speech 1 hour ago. EXAM: CT HEAD WITHOUT CONTRAST TECHNIQUE: Contiguous axial images were obtained from the base of the skull through the vertex without intravenous contrast. COMPARISON:  05/11/2012 FINDINGS: Brain: There is an acute/ early subacute hemorrhage in the right basal ganglia region measuring approximately 1.9 x 2 x 2.5 cm (volume = 5 cm^3). There is a thin rim of surrounding edema suggesting that this may be 281-303 days old. No mass effect or shift. This is most likely a hypertensive hemorrhage or hemorrhagic infarction. Elsewhere, the brain parenchyma appears normal. No evidence of other stroke. No evidence of neoplastic mass lesion, hydrocephalus or extra-axial collection. Small calcification along the age of the fourth ventricle probably represents a benign subependymal calcification. Small dural calcification in the right occipital region is benign and insignificant. Vascular: No abnormal vascular finding. Skull: Normal Sinuses/Orbits: Mucosal inflammatory disease affecting the paranasal sinuses. Orbits negative. Other: None IMPRESSION: 1. 1.9 x 2 x 2.5 cm hemorrhage in the right basal ganglia/ external capsule region with a thin rim of surrounding edema, suggesting that this may be 61-313 days old. No mass-effect or shift. Most typical of either a hypertensive hemorrhage or hemorrhagic infarction. The remainder the brain is negative. 2. No evidence of any large vessel occlusion findings. These results were called by telephone at the time of interpretation on 05/09/2017 at 10:45 am to Dr. Roxan Hockeyobinson , who verbally acknowledged these results. Electronically Signed   By: Paulina FusiMark  Shogry M.D.   On: 05/09/2017 10:52       Assessment and plan discussed with with attending  physician and they are in agreement.    Felicie MornDavid Smith PA-C [facility] [phone]  05/09/2017, 3:30 PM  Assessment: 41 y.o. female presenting after awakening  with left facial droop, slurred speech and difficulty with gait.  Head CT reviewed and shows a right BG hemorrhage.  Patient therefore not a tPA candidate.  Has a history of hypertension which is the likely etiology of her hemorrhage.  Patient is noncompliant.  BP elevated on presentation.    Stroke Risk Factors - hypertension and smoking  ICH most likely from hypertension  ICH score 0  Plan: 1. HgbA1c, fasting lipid panel 2. CTA head and neck 3. Remain in ICU with telemetry 4. NPO until RN stroke swallow screen 5. Telemetry monitoring 6. Frequent neuro checks 7. BP control with target SBP<140    Stroke Risk Factors - hypertension

## 2017-05-09 NOTE — ED Triage Notes (Signed)
1029-to ct scan with RN

## 2017-05-09 NOTE — ED Triage Notes (Signed)
Pt reports started with facial droop this morning at 0930.  Thought she was fine when woke up but then noticed trouble speaking.  Left facial droop noted.  Denies pain or headache. Hx HTN but has not taken medicine for a while.

## 2017-05-09 NOTE — ED Notes (Signed)
[facility]  CALLED  FOR  TRANSFER  XRAY  POWER SHARE  TO  [facility]

## 2017-05-09 NOTE — ED Provider Notes (Signed)
[facility] Emergency Department Provider Note       Time seen: ----------------------------------------- 10:35 AM on 05/09/2017 -----------------------------------------     I have reviewed the triage vital signs and the nursing notes.   HISTORY   Chief Complaint Code Stroke    HPI Diane Keller is a 41 y.o. female who presents to the ED for facial droop, speech disturbance and lack of coordination in the right arm that she noticed this morning. Husband states she noticed when she looked in the near her face was drooping and she had difficulty holding her coffee cup. This never happened to her before, symptom onset was probably an hour ago. Nothing makes her symptoms better or worse.   No past medical history on file.  There are no active problems to display for this patient.   No past surgical history on file.  Allergies Patient has no allergy information on record.  Social History Social History  Substance Use Topics  . Smoking status: Not on file  . Smokeless tobacco: Not on file  . Alcohol use Not on file    Review of Systems Constitutional: Negative for fever. Eyes: Negative for vision changes ENT:  Negative for congestion, sore throat Cardiovascular: Negative for chest pain. Respiratory: Negative for shortness of breath. Gastrointestinal: Negative for abdominal pain, vomiting and diarrhea. Genitourinary: Negative for dysuria. Musculoskeletal: Negative for back pain. Skin: Negative for rash. Neurological:Positive for right arm weakness, speech disturbance, facial droop  All systems negative/normal/unremarkable except as stated in the HPI  ____________________________________________   PHYSICAL EXAM:  VITAL SIGNS: ED Triage Vitals [05/09/17 1028]  Enc Vitals Group     BP (!) 186/112     Pulse Rate (!) 114     Resp 20     Temp 98.4 F (36.9 C)     Temp Source Oral     SpO2 99 %     Weight      Height      Head  Circumference      Peak Flow      Pain Score      Pain Loc      Pain Edu?      Excl. in GC?     Constitutional: Alert and oriented. Mild distress, anxious Eyes: Conjunctivae are injected bilaterally. Normal extraocular movements. ENT   Head: Normocephalic and atraumatic.   Nose: No congestion/rhinnorhea.   Mouth/Throat: Mucous membranes are moist.   Neck: No stridor. Cardiovascular: Normal rate, regular rhythm. No murmurs, rubs, or gallops. Respiratory: Normal respiratory effort without tachypnea nor retractions. Breath sounds are clear and equal bilaterally. No wheezes/rales/rhonchi. Gastrointestinal: Soft and nontender. Normal bowel sounds Musculoskeletal: Nontender with normal range of motion in extremities. No lower extremity tenderness nor edema. Neurologic: Left-sided facial droop with normal sensation, strength is normal in the upper and lower extremities Skin:  Skin is warm, dry and intact. No rash noted. Psychiatric: Anxious mood and affect ____________________________________________  EKG: Interpreted by me. Sinus tachycardia with rate, normal QRS size. Possible old anterior infarct age and determinate.  ____________________________________________  ED COURSE:  Pertinent labs & imaging results that were available during my care of the patient were reviewed by me and considered in my medical decision making (see chart for details). Patient presents for possible acute stroke, we will assess with labs and imaging as indicated.   Procedures ____________________________________________   LABS (pertinent positives/negatives)  Labs Reviewed  CBC - Abnormal; Notable for the following:       Result  Value   RBC 3.69 (*)    Hemoglobin 10.9 (*)    HCT 31.7 (*)    RDW 15.3 (*)    All other components within normal limits  COMPREHENSIVE METABOLIC PANEL - Abnormal; Notable for the following:    Potassium 2.8 (*)    Glucose, Bld 102 (*)    ALT 9 (*)    All  other components within normal limits  GLUCOSE, CAPILLARY - Abnormal; Notable for the following:    Glucose-Capillary 110 (*)    All other components within normal limits  PROTIME-INR  APTT  DIFFERENTIAL  TROPONIN I  CBG MONITORING, ED   CRITICAL CARE Performed by: Emily Filbert   Total critical care time: 30 minutes  Critical care time was exclusive of separately billable procedures and treating other patients.  Critical care was necessary to treat or prevent imminent or life-threatening deterioration.  Critical care was time spent personally by me on the following activities: development of treatment plan with patient and/or surrogate as well as nursing, discussions with consultants, evaluation of patient's response to treatment, examination of patient, obtaining history from patient or surrogate, ordering and performing treatments and interventions, ordering and review of laboratory studies, ordering and review of radiographic studies, pulse oximetry and re-evaluation of patient's condition.   RADIOLOGY Images were viewed by me  CT head IMPRESSION: 1. 1.9 x 2 x 2.5 cm hemorrhage in the right basal ganglia/ external capsule region with a thin rim of surrounding edema, suggesting that this may be 22-61 days old. No mass-effect or shift. Most typical of either a hypertensive hemorrhage or hemorrhagic infarction. The remainder the brain is negative. 2. No evidence of any large vessel occlusion findings.  These results were called by telephone at the time of interpretation on 05/09/2017 at 10:45 am to Dr. Roxan Hockey , who verbally acknowledged these results.  ____________________________________________  FINAL ASSESSMENT AND PLAN  Right basal ganglia hemorrhage, hypertensive emergency  Plan: Patient's labs and imaging were dictated above. Patient had presented for acute neurologic symptoms that began this morning with facial droop, difficulty speaking and clumsiness.  Symptoms seem to be coming from a right basal ganglia hemorrhage secondary to hypertension. We have started her on a nicardipine drip for blood pressure control. Dr. Thad Ranger from neurology has been involved and Dr. Adriana Simas from neurosurgery was consult to evaluate the patient in the ER. She'll be transferred to [facility] for inpatient evaluation. Currently blood pressure is in the 140s over 80s systolic on nicardipine. She did require a dose of labetalol.   Emily Filbert, MD   Note: This note was generated in part or whole with voice recognition software. Voice recognition is usually quite accurate but there are transcription errors that can and very often do occur. I apologize for any typographical errors that were not detected and corrected.     Emily Filbert, MD 05/09/17 [phone]

## 2017-05-10 ENCOUNTER — Encounter (HOSPITAL_COMMUNITY): Payer: Self-pay | Admitting: Neurology

## 2017-05-10 ENCOUNTER — Inpatient Hospital Stay (HOSPITAL_COMMUNITY): Payer: Medicaid Other

## 2017-05-10 LAB — HIV ANTIBODY (ROUTINE TESTING W REFLEX): HIV Screen 4th Generation wRfx: NONREACTIVE

## 2017-05-10 MED ORDER — LABETALOL HCL 100 MG PO TABS
150.0000 mg | ORAL_TABLET | Freq: Two times a day (BID) | ORAL | Status: DC
  Administered 2017-05-10 – 2017-05-12 (×5): 150 mg via ORAL
  Filled 2017-05-10 (×5): qty 2

## 2017-05-10 NOTE — Progress Notes (Signed)
OT Cancellation Note  Patient Details Name: Diane Keller MRN: [id number] DOB: 01/14/1976   Cancelled Treatment:    Reason Eval/Treat Not Completed: Patient not medically ready. Pt on bedrest. Please update activity orders when appropriate for OT. Thanks  [facility]  Diane Keller, OT/L  [phone] 05/10/2017 05/10/2017, 7:04 AM

## 2017-05-10 NOTE — Progress Notes (Signed)
STROKE TEAM PROGRESS NOTE   HISTORY OF PRESENT ILLNESS (per record) Diane Keller is an 41 y.o. female with a history of HTN, noncompliant with antihypertensives for the past 8 months, who presented with left-sided weakness, left facial droop, and slurred speech on 05/09/2017.  She reports awakening 05/09/2017 with dysequilibrium and with left-sided weakness.  She reports dropping many things from her left hand.  She went to the kitchen where she was having difficulty as well and called her husband. While on the phone noted that her speech was slurred and that the left side of her mouth was not moving normally.  Initial NIHSS of 4.  Initial CT head showed no stroke.  Repeat CT head on showed small acute right basal ganglia hemorrhage.  MRI head pending.  Patient was not administered IV t-PA secondary to ICH. She was admitted to the neuro ICU for further evaluation and treatment.   SUBJECTIVE (INTERVAL HISTORY) Her husband and child are at the bedside.  The patient is alert, oriented, and follows all commands appropriately.Her blood pressure is adequately controlled. Repeat CT scan of the head this morning shows stable appearance of the right basal ganglia hemorrhage without significant mass effect, midline shift or intraventricular extension   OBJECTIVE Temp:  [98.4 F (36.9 C)-99 F (37.2 C)] 99 F (37.2 C) (06/27 0400) Pulse Rate:  [81-114] 82 (06/27 0715) Cardiac Rhythm: Normal sinus rhythm (06/27 0400) Resp:  [14-31] 24 (06/27 0715) BP: (118-208)/(59-114) 124/69 (06/27 0715) SpO2:  [89 %-100 %] 93 % (06/27 0715) Weight:  [113.4 kg (250 lb)-128.9 kg (284 lb 2.8 oz)] 128.9 kg (284 lb 2.8 oz) (06/26 1513)  CBC:  Recent Labs Lab 05/09/17 1027  WBC 6.5  NEUTROABS 3.8  HGB 10.9*  HCT 31.7*  MCV 86.0  PLT 302    Basic Metabolic Panel:  Recent Labs Lab 05/09/17 1027  NA 137  K 2.8*  CL 105  CO2 24  GLUCOSE 102*  BUN 11  CREATININE 0.87  CALCIUM 9.1    Lipid Panel: No  results found for: CHOL, TRIG, HDL, CHOLHDL, VLDL, LDLCALC HgbA1c: No results found for: HGBA1C Urine Drug Screen:    Component Value Date/Time   LABOPIA POSITIVE 05/11/2012 1513   COCAINSCRNUR NEGATIVE 05/11/2012 1513   LABBENZ NEGATIVE 05/11/2012 1513   AMPHETMU NEGATIVE 05/11/2012 1513   THCU NEGATIVE 05/11/2012 1513   LABBARB NEGATIVE 05/11/2012 1513    Alcohol Level No results found for: ETH  IMAGING  Ct Head Wo Contrast 05/10/2017 IMPRESSION: 1. Stable acute hemorrhage within the right basal ganglia, surrounding edema, and mild local mass effect. No new acute intracranial hemorrhage identified. No herniation. 2. Diffuse paranasal sinus disease with fluid levels compatible with acute sinusitis in the appropriate clinical setting.  Ct Head Code Stroke W/o Cm 05/09/2017 IMPRESSION: 1. 1.9 x 2 x 2.5 cm hemorrhage in the right basal ganglia/ external capsule region with a thin rim of surrounding edema, suggesting that this may be 65-41 days old. No mass-effect or shift. Most typical of either a hypertensive hemorrhage or hemorrhagic infarction. The remainder the brain is negative. 2. No evidence of any large vessel occlusion findings.  MRI Head  pending     PHYSICAL EXAM Obese middle aged african american lady not in distress. . Afebrile. Head is nontraumatic. Neck is supple without bruit.    Cardiac exam no murmur or gallop. Lungs are clear to auscultation. Distal pulses are well felt. Neurological Exam :  Awake alert oriented x 3 normal speech  and language. Mild left lower face asymmetry. Tongue midline. No drift. Mild diminished fine finger movements on left. Orbits right over left upper extremity. Mild left grip weak.. Normal sensation . Normal coordination. Gait not tested ASSESSMENT/PLAN Ms. Diane Keller is a 41 y.o. female with history of  HTN, noncompliant with antihypertensives for the past 8 months presenting with eft-sided weakness, left facial droop, and slurred  speech. She did not receive IV t-PA due to ICH.   Stroke: Small right basal ganglia hemorrhage secondary to uncontrolled chronic hypertension  Resultant  Mild left face and hand weakness  CT head: no stroke  MRI head  pending   LDL  pending  HgbA1c pending  SCDs for VTE prophylaxis  Diet heart healthy/carb modified Room service appropriate? Yes; Fluid consistency: Thin  No antithrombotic prior to admission, now on No antithrombotic  Ongoing aggressive stroke risk factor management  Therapy recommendations:  pending  Disposition:   pending  Hypertension  Stable  Permissive hypertension (OK if < 220/120) but gradually normalize in 5-7 days  Long-term BP goal normotensive  Patient restarted on home dose of labetalol 150 mg PO BID  Hyperlipidemia  Home meds:  none  LDL  pending, goal < 70  Add atorvastatin 40mg  daily if LDL not at goal and continue statin at discharge  Diabetes  HgbA1c pending, goal < 7.0  Controlled  Other Stroke Risk Factors  Cigarette smoker, advised to stop smoking  Morbid obesity, Body mass index is 45.87 kg/m., recommend weight loss, diet and exercise as appropriate   Other Active Problems   Medication non-compliance  Hospital day # 1  I have personally examined this patient, reviewed notes, independently viewed imaging studies, participated in medical decision making and plan of care.ROS completed by me personally and pertinent positives fully documented  I have made any additions or clarifications directly to the above note. She presented with small right basal ganglia hemorrhage etiology likely hypertensive. Maintain strict blood pressure control and close neurological monitoring. Check MRI scan. Patient counseled to be compliant with her blood pressure medications. Long discussion of the bedside with the patient and husband and answered questions. This patient is critically ill and at significant risk of neurological worsening,  death and care requires constant monitoring of vital signs, hemodynamics,respiratory and cardiac monitoring, extensive review of multiple databases, frequent neurological assessment, discussion with family, other specialists and medical decision making of high complexity.I have made any additions or clarifications directly to the above note.This critical care time does not reflect procedure time, or teaching time or supervisory time of PA/NP/Med Resident etc but could involve care discussion time.  I spent 30 minutes of neurocritical care time  in the care of  this patient.     Delia HeadyPramod Sethi, MD Medical Director [facility] Pager: [phone] 05/10/2017 4:50 PM   To contact Stroke Continuity provider, please refer to [email or web address]. After hours, contact General Neurology

## 2017-05-10 NOTE — Progress Notes (Signed)
PT Cancellation Note  Patient Details Name: Diane HoopsChristie L Hutchins MRN: [id number] DOB: 01/11/1976   Cancelled Treatment:    Reason Eval/Treat Not Completed: Patient not medically ready. Pt remains on strict bedrest. PT to return as able once activity orders updated.   Yeraldine Forney M Lizette Pazos 05/10/2017, 7:40 AM   Lewis ShockAshly Lyndsi Altic, PT, DPT Pager #: [phone] Office #: [phone]

## 2017-05-10 NOTE — Evaluation (Signed)
Physical Therapy Evaluation Patient Details Name: Diane Keller MRN: [id number] DOB: 12-09-1975 Today's Date: 05/10/2017   History of Present Illness  SHAVAUGHN SEIDL an 41 y.o.femalewith a history of HTN, noncompliant with antihypertensives for the past 8 months, who presented with left-sided weakness, left facial droop, and slurred speech on 05/09/2017. She reports awakening 05/09/2017 with dysequilibrium and with left-sided weakness. Repeat CT head on showed small acute right basal ganglia hemorrhage.  Clinical Impression  Pt admitted with above. Pt presenting with mild L hemiplegia and R facial drop but functioning at min guard level. Acute PT to con't to follow to address higher level balance as patient has a 43 yo and stays home with him all day.     Follow Up Recommendations Outpatient PT;Supervision - Intermittent    Equipment Recommendations  None recommended by PT    Recommendations for Other Services       Precautions / Restrictions Precautions Precautions: Fall Precaution Comments: R facial drop, mild L hemi Restrictions Weight Bearing Restrictions: No      Mobility  Bed Mobility Overal bed mobility: Needs Assistance Bed Mobility: Supine to Sit     Supine to sit: Min assist     General bed mobility comments: HOB elevated, use of bedrail, Pt pulled up on PT  Transfers Overall transfer level: Needs assistance Equipment used: None Transfers: Sit to/from Stand Sit to Stand: Min guard         General transfer comment: pt stood up without instability  Ambulation/Gait Ambulation/Gait assistance: Min guard Ambulation Distance (Feet): 160 Feet Assistive device: None Gait Pattern/deviations: Step-through pattern;Wide base of support Gait velocity: wfl Gait velocity interpretation: at or above normal speed for age/gender General Gait Details: pt with initial decreased step length on the L, after v/c's given pt with improved fluidity of gait  pattern  Stairs            Wheelchair Mobility    Modified Rankin (Stroke Patients Only)       Balance Overall balance assessment: No apparent balance deficits (not formally assessed)                                           Pertinent Vitals/Pain Pain Assessment: No/denies pain    Home Living Family/patient expects to be discharged to:: Private residence Living Arrangements: Spouse/significant other Available Help at Discharge: Family;Available PRN/intermittently (spouse works during the day) Type of Home: House Home Access: Level entry     Home Layout: One level Home Equipment: None      Prior Function Level of Independence: Independent         Comments: pt stays home with 5 yo son     Hand Dominance   Dominant Hand: Right    Extremity/Trunk Assessment   Upper Extremity Assessment Upper Extremity Assessment: LUE deficits/detail LUE Deficits / Details: grossly 4-/5 LUE Sensation: decreased light touch LUE Coordination: decreased fine motor (mild deficit)    Lower Extremity Assessment Lower Extremity Assessment: LLE deficits/detail LLE Deficits / Details: grossly 4/5    Cervical / Trunk Assessment Cervical / Trunk Assessment: Normal  Communication   Communication:  (slurred speech)  Cognition Arousal/Alertness: Awake/alert Behavior During Therapy: WFL for tasks assessed/performed Overall Cognitive Status: Within Functional Limits for tasks assessed  General Comments      Exercises     Assessment/Plan    PT Assessment Patient needs continued PT services  PT Problem List Decreased strength;Decreased activity tolerance;Decreased balance;Decreased mobility;Decreased coordination;Decreased cognition       PT Treatment Interventions DME instruction;Gait training;Functional mobility training;Therapeutic activities;Therapeutic exercise;Neuromuscular  re-education;Balance training    PT Goals (Current goals can be found in the Care Plan section)  Acute Rehab PT Goals Patient Stated Goal: home PT Goal Formulation: With patient Time For Goal Achievement: 05/17/17 Potential to Achieve Goals: Good Additional Goals Additional Goal #1: Pt to score >19 on DGI to indicate minimal falls risk.    Frequency Min 4X/week   Barriers to discharge        Co-evaluation               AM-PAC PT "6 Clicks" Daily Activity  Outcome Measure Difficulty turning over in bed (including adjusting bedclothes, sheets and blankets)?: A Little Difficulty moving from lying on back to sitting on the side of the bed? : A Little Difficulty sitting down on and standing up from a chair with arms (e.g., wheelchair, bedside commode, etc,.)?: A Little Help needed moving to and from a bed to chair (including a wheelchair)?: A Little Help needed walking in hospital room?: A Little Help needed climbing 3-5 steps with a railing? : A Little 6 Click Score: 18    End of Session Equipment Utilized During Treatment: Gait belt Activity Tolerance: Patient tolerated treatment well Patient left: in chair;with call bell/phone within reach;with family/visitor present Nurse Communication: Mobility status PT Visit Diagnosis: Hemiplegia and hemiparesis;Muscle weakness (generalized) (M62.81) Hemiplegia - Right/Left: Left Hemiplegia - dominant/non-dominant: Non-dominant Hemiplegia - caused by: Nontraumatic intracerebral hemorrhage    Time: [id number] PT Time Calculation (min) (ACUTE ONLY): 24 min   Charges:   PT Evaluation $PT Eval Moderate Complexity: 1 Procedure PT Treatments $Gait Training: 8-22 mins   PT G Codes:        Lewis ShockAshly Dorean Hiebert, PT, DPT Pager #: [phone] Office #: [phone]   Damita Eppard M Marshel Golubski 05/10/2017, 5:23 PM

## 2017-05-10 NOTE — Plan of Care (Signed)
Problem: Nutrition: Goal: Dietary intake will improve Outcome: Completed/Met Date Met: 05/10/17 Cardiac diet

## 2017-05-11 ENCOUNTER — Inpatient Hospital Stay (HOSPITAL_COMMUNITY): Payer: Medicaid Other

## 2017-05-11 LAB — ECHOCARDIOGRAM COMPLETE
AVLVOTPG: 6 mmHg
CHL CUP DOP CALC LVOT VTI: 23 cm
CHL CUP TV REG PEAK VELOCITY: 243 cm/s
E/e' ratio: 8.12
EWDT: 192 ms
FS: 41 % (ref 28–44)
HEIGHTINCHES: 66 in
IV/PV OW: 0.84
LA diam index: 1.35 cm/m2
LA vol A4C: 43.3 ml
LASIZE: 34 mm
LAVOL: 51.5 mL
LAVOLIN: 20.4 mL/m2
LDCA: 3.8 cm2
LEFT ATRIUM END SYS DIAM: 34 mm
LV PW d: 9.95 mm — AB (ref 0.6–1.1)
LV TDI E'MEDIAL: 7.18
LV e' LATERAL: 10.3 cm/s
LVEEAVG: 8.12
LVEEMED: 8.12
LVOT SV: 87 mL
LVOT diameter: 22 mm
LVOTPV: 118 cm/s
Lateral S' vel: 19.6 cm/s
MV Dec: 192
MV Peak grad: 3 mmHg
MV pk E vel: 83.6 m/s
MVPKAVEL: 109 m/s
TAPSE: 29 mm
TDI e' lateral: 10.3
TR max vel: 243 cm/s
WEIGHTICAEL: 4546.77 [oz_av]

## 2017-05-11 LAB — LIPID PANEL
CHOL/HDL RATIO: 3.7 ratio
Cholesterol: 150 mg/dL (ref 0–200)
HDL: 41 mg/dL (ref >40)
LDL CALC: 94 mg/dL (ref 0–99)
Triglycerides: 77 mg/dL (ref <150)
VLDL: 15 mg/dL (ref 0–40)

## 2017-05-11 MED ORDER — NICARDIPINE HCL IN NACL 20-0.86 MG/200ML-% IV SOLN
3.0000 mg/h | INTRAVENOUS | Status: DC
Start: 2017-05-11 — End: 2017-05-12
  Administered 2017-05-11: 5 mg/h via INTRAVENOUS
  Administered 2017-05-11: 10 mg/h via INTRAVENOUS
  Administered 2017-05-12 (×2): 5 mg/h via INTRAVENOUS
  Filled 2017-05-11 (×4): qty 200

## 2017-05-11 MED ORDER — LISINOPRIL 20 MG PO TABS
20.0000 mg | ORAL_TABLET | Freq: Every day | ORAL | Status: DC
  Administered 2017-05-11 – 2017-05-12 (×2): 20 mg via ORAL
  Filled 2017-05-11 (×2): qty 1

## 2017-05-11 NOTE — Progress Notes (Signed)
  Echocardiogram 2D Echocardiogram has been performed.  Diane Keller, Diane Keller 05/11/2017, 6:09 PM

## 2017-05-11 NOTE — Progress Notes (Signed)
Physical Therapy Treatment and Discharge Patient Details Name: Diane Keller MRN: [id number] DOB: [birth year] Today's Date: 05/11/2017    History of Present Illness Diane Keller an 40 y.o.femalewith a history of HTN, noncompliant with antihypertensives for the past 8 months, who presented with left-sided weakness, left facial droop, and slurred speech on 05/09/2017. She reports awakening 05/09/2017 with dysequilibrium and with left-sided weakness. Repeat CT head on showed small acute right basal ganglia hemorrhage.    PT Comments    Pt progressing well with functional mobility. Pt reports feeling back to baseline except for residual speech impairment. This session pt functioning at a gross mod I level and did not require assist from therapist for balance or safety. BP monitored throughout session and was elevated at end of session. RN notified. Patient is appropriate for continued mobility in hall with nursing staff as able. Will sign off at this time. If needs change, please reconsult.  BP Readings: 152/85 supine at rest 168/114 sitting EOB after ambulation 171/111 supine after ~5 minutes back in bed end of session   Follow Up Recommendations  Outpatient PT;Supervision - Intermittent     Equipment Recommendations  None recommended by PT    Recommendations for Other Services       Precautions / Restrictions Precautions Precautions: Fall Precaution Comments: R facial drop, mild L hemi Restrictions Weight Bearing Restrictions: No    Mobility  Bed Mobility Overal bed mobility: Modified Independent Bed Mobility: Supine to Sit;Sit to Supine           General bed mobility comments: HOB flat and rails lowered to simulate home environment. Pt was able to complete without assistance.   Transfers Overall transfer level: Modified independent Equipment used: None Transfers: Sit to/from Stand           General transfer comment: Therapist handled lines however pt  was able to transition to/from standing without assistance and without any noted unsteadiness.   Ambulation/Gait Ambulation/Gait assistance: Modified independent (Device/Increase time) Ambulation Distance (Feet): 300 Feet Assistive device: None Gait Pattern/deviations: Step-through pattern;Wide base of support Gait velocity: wfl Gait velocity interpretation: at or above normal speed for age/gender General Gait Details: Pt with slower gait speed initially but able to improve with cues. No unsteadiness or LOB noted. Pt with VC's for pursed-lip breathing to control respiratory rate (monitor alarming, however pt did not appear dyspneic or with rapid RR).    Stairs Stairs: Yes   Stair Management: One rail Right;Alternating pattern;Step to pattern;Forwards Number of Stairs: 10 General stair comments: alternating step pattern ascending, step-to pattern descending. Pt does not have stairs at home, but performed as part of the DGI.  Wheelchair Mobility    Modified Rankin (Stroke Patients Only) Modified Rankin (Stroke Patients Only) Pre-Morbid Rankin Score: No symptoms Modified Rankin: No significant disability     Balance Overall balance assessment: No apparent balance deficits (not formally assessed)                               Standardized Balance Assessment Standardized Balance Assessment : Dynamic Gait Index   Dynamic Gait Index Level Surface: Normal Change in Gait Speed: Normal Gait with Horizontal Head Turns: Normal Gait with Vertical Head Turns: Normal Gait and Pivot Turn: Normal Step Over Obstacle: Mild Impairment Step Around Obstacles: Normal Steps: Mild Impairment Total Score: 22      Cognition Arousal/Alertness: Awake/alert Behavior During Therapy: WFL for tasks assessed/performed Overall Cognitive Status: Within  Functional Limits for tasks assessed                                        Exercises      General Comments         Pertinent Vitals/Pain Pain Assessment: No/denies pain    Home Living Family/patient expects to be discharged to:: Private residence Living Arrangements: Spouse/significant other Available Help at Discharge: Family;Available PRN/intermittently (spouse works during the day) Type of Home: House Home Access: Level entry   Home Layout: One level Home Equipment: None      Prior Function Level of Independence: Independent      Comments: pt stays home with 285 yo son   PT Goals (current goals can now be found in the care plan section) Acute Rehab PT Goals Patient Stated Goal: home PT Goal Formulation: With patient Time For Goal Achievement: 05/17/17 Potential to Achieve Goals: Good Progress towards PT goals: Progressing toward goals    Frequency    Min 4X/week      PT Plan Current plan remains appropriate    Co-evaluation              AM-PAC PT "6 Clicks" Daily Activity  Outcome Measure  Difficulty turning over in bed (including adjusting bedclothes, sheets and blankets)?: A Little Difficulty moving from lying on back to sitting on the side of the bed? : A Little Difficulty sitting down on and standing up from a chair with arms (e.g., wheelchair, bedside commode, etc,.)?: A Little Help needed moving to and from a bed to chair (including a wheelchair)?: A Little Help needed walking in hospital room?: A Little Help needed climbing 3-5 steps with a railing? : A Little 6 Click Score: 18    End of Session Equipment Utilized During Treatment: Gait belt Activity Tolerance: Patient tolerated treatment well Patient left: in chair;with call bell/phone within reach;with family/visitor present Nurse Communication: Mobility status PT Visit Diagnosis: Hemiplegia and hemiparesis;Muscle weakness (generalized) (M62.81) Hemiplegia - Right/Left: Left Hemiplegia - dominant/non-dominant: Non-dominant Hemiplegia - caused by: Nontraumatic intracerebral hemorrhage     Time:  [id number] PT Time Calculation (min) (ACUTE ONLY): 26 min  Charges:  $Gait Training: 8-22 mins $Physical Performance Test: 8-22 mins                    G Codes:       Diane Keller, PT, DPT Acute Rehabilitation Services Pager: [phone]    Marylynn PearsonLaura D Dow Blahnik 05/11/2017, 3:23 PM

## 2017-05-11 NOTE — Progress Notes (Signed)
STROKE TEAM PROGRESS NOTE   HISTORY OF PRESENT ILLNESS (per record) Diane Keller is an 41 y.o. female with a history of HTN, noncompliant with antihypertensives for the past 8 months, who presented with left-sided weakness, left facial droop, and slurred speech on 05/09/2017.  She reports awakening 05/09/2017 with dysequilibrium and with left-sided weakness.  She reports dropping many things from her left hand.  She went to the kitchen where she was having difficulty as well and called her husband. While on the phone noted that her speech was slurred and that the left side of her mouth was not moving normally.  Initial NIHSS of 4.  Initial CT head showed no stroke.  Repeat CT head on showed small acute right basal ganglia hemorrhage.  MRI head pending.  Patient was not administered IV t-PA secondary to ICH. She was admitted to the neuro ICU for further evaluation and treatment.   SUBJECTIVE (INTERVAL HISTORY) Her husband and child are at the bedside.  The patient is alert, oriented, and follows all commands appropriately.  Her blood pressure is adequately controlled. MRI scan of the brain shows stable appearance of the right basal ganglia hemorrhage with a 1 cm area of diffusion abnormality medial to it raising possibility of hemorrhagic infarct but MRA of the brain is normal   OBJECTIVE Temp:  [98 F (36.7 C)-99.4 F (37.4 C)] 99.4 F (37.4 C) (06/28 1200) Pulse Rate:  [79-93] 80 (06/28 1100) Cardiac Rhythm: Normal sinus rhythm (06/28 0800) Resp:  [14-28] 18 (06/28 1100) BP: (100-162)/(47-124) 150/90 (06/28 1100) SpO2:  [91 %-98 %] 91 % (06/28 1100)  CBC:   Recent Labs Lab 05/09/17 1027  WBC 6.5  NEUTROABS 3.8  HGB 10.9*  HCT 31.7*  MCV 86.0  PLT 302    Basic Metabolic Panel:   Recent Labs Lab 05/09/17 1027  NA 137  K 2.8*  CL 105  CO2 24  GLUCOSE 102*  BUN 11  CREATININE 0.87  CALCIUM 9.1    Lipid Panel:     Component Value Date/Time   CHOL 150 05/11/2017  0535   TRIG 77 05/11/2017 0535   HDL 41 05/11/2017 0535   CHOLHDL 3.7 05/11/2017 0535   VLDL 15 05/11/2017 0535   LDLCALC 94 05/11/2017 0535   HgbA1c: No results found for: HGBA1C Urine Drug Screen:     Component Value Date/Time   LABOPIA POSITIVE 05/11/2012 1513   COCAINSCRNUR NEGATIVE 05/11/2012 1513   LABBENZ NEGATIVE 05/11/2012 1513   AMPHETMU NEGATIVE 05/11/2012 1513   THCU NEGATIVE 05/11/2012 1513   LABBARB NEGATIVE 05/11/2012 1513    Alcohol Level No results found for: ETH  IMAGING  Ct Head Wo Contrast 05/10/2017 IMPRESSION: 1. Stable acute hemorrhage within the right basal ganglia, surrounding edema, and mild local mass effect. No new acute intracranial hemorrhage identified. No herniation. 2. Diffuse paranasal sinus disease with fluid levels compatible with acute sinusitis in the appropriate clinical setting.  Ct Head Code Stroke W/o Cm 05/09/2017 IMPRESSION: 1. 1.9 x 2 x 2.5 cm hemorrhage in the right basal ganglia/ external capsule region with a thin rim of surrounding edema, suggesting that this may be 1051-83 days old. No mass-effect or shift. Most typical of either a hypertensive hemorrhage or hemorrhagic infarction. The remainder the brain is negative. 2. No evidence of any large vessel occlusion findings.  MRI Brain MRA  05/11/2017 1. Stable hemorrhage within right basal ganglia given differences in technique. 2. 1 cm focus of acute/ early subacute infarction medial to  the hemorrhage within posterior limb of internal capsule. 3. T2 FLAIR hyperintense foci in white matter are nonspecific, but probably represent chronic microvascular ischemic changes given history of hypertension. 4. Severe paranasal sinus disease with fluid levels which may represent acute sinusitis in the appropriate clinical setting. 5. Normal MRA of the head.      PHYSICAL EXAM Obese middle aged african american lady not in distress. . Afebrile. Head is nontraumatic. Neck is supple without  bruit.    Cardiac exam no murmur or gallop. Lungs are clear to auscultation. Distal pulses are well felt. Neurological Exam :  Awake alert oriented x 3 normal speech and language. Mild left lower face asymmetry. Tongue midline. No drift. Mild diminished fine finger movements on left. Orbits right over left upper extremity. Mild left grip weak.. Normal sensation . Normal coordination. Gait not tested ASSESSMENT/PLAN Ms. Diane Keller is a 41 y.o. female with history of  HTN, noncompliant with antihypertensives for the past 8 months presenting with eft-sided weakness, left facial droop, and slurred speech. She did not receive IV t-PA due to ICH.   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  1 cm focus of acute/ early subacute infarction medial to the hemorrhage within posterior limb of internal capsule.  MRA: Normal MRA of the head.   LDL 94  HgbA1c pending  SCDs for VTE prophylaxis Diet heart healthy/carb modified Room service appropriate? Yes; Fluid consistency: Thin  No antithrombotic prior to admission, now on No antithrombotic  Ongoing aggressive stroke risk factor management  Therapy recommendations:  pending  Disposition:   pending  Hypertension  Stable  Goal SBP < 140 mmHg  Long-term BP goal normotensive  Patient restarted on home dose of labetalol 150 mg PO BID  Hyperlipidemia  Home meds:  none  LDL  pending, goal < 70  Add atorvastatin 40mg  daily if LDL not at goal and continue statin at discharge  Diabetes  HgbA1c pending, goal < 7.0  Controlled  Other Stroke Risk Factors  Cigarette smoker, advised to stop smoking  Morbid obesity, Body mass index is 45.87 kg/m., recommend weight loss, diet and exercise as appropriate   Other Active Problems    Medication non-compliance  Hospital day # 2   Long discussion at the bedside with the patient and husband and answered questions.  Also to neurology floor bed today and mobilize out of bed and therapy consults. Check echocardiogram. Likely discharge home tomorrow if stable. Greater than 50% time during this 35 minute visit was spent on counseling and coordination of care about her intracerebral hemorrhage, stroke risk prevention and treatment discussion.    Delia Heady, MD Medical Director [facility] Pager: [phone] 05/11/2017 1:37 PM   To contact Stroke Continuity provider, please refer to [email or web address]. After hours, contact General Neurology

## 2017-05-11 NOTE — Progress Notes (Signed)
Upon initial assessment, pt's BP was climbing as high as 200's systolic. Per AM shift RN, neurology stated that pt could have permissive HTN up to 180 systolic - no note or order placed for that. Pt was given PRN labetalol without significant decrease in pressure. Therefore, on-call neurology MD notified of pt situation and given orders that SBP should be <140 still at this time. Given orders to restart cardene gtt and titrate per the orders, and new orders for lisinopril PO.   Will follow through with orders as given and monitor closely.  Francia GreavesSavannah R Brighton Delio, RN

## 2017-05-12 DIAGNOSIS — I161 Hypertensive emergency: Secondary | ICD-10-CM

## 2017-05-12 LAB — HEMOGLOBIN A1C
Hgb A1c MFr Bld: 5.4 % (ref 4.8–5.6)
Mean Plasma Glucose: 108 mg/dL

## 2017-05-12 MED ORDER — LABETALOL HCL 300 MG PO TABS: 150.0000 mg | ORAL_TABLET | Freq: Two times a day (BID) | ORAL | 0 refills | Status: DC

## 2017-05-12 MED ORDER — LISINOPRIL 20 MG PO TABS: 20.0000 mg | ORAL_TABLET | Freq: Every day | ORAL | 0 refills | Status: DC

## 2017-05-12 NOTE — Care Management Note (Signed)
Case Management Note  Patient Details  Name: Diane Keller MRN: [id number] Date of Birth: 11/17/1975  Subjective/Objective:  Pt admitted on 05/09/17 with small acute Rt basal ganglia hemorrhage.  PTA, pt independent, lives with spouse.  Supportive family at bedside.                   Action/Plan: PT recommending OP services.  [facility]  Pt agreeable to OP follow up and [facility]  Pt to call for appointment of Monday.    Expected Discharge Date:  05/12/17               Expected Discharge Plan:  OP Rehab  In-House Referral:     Discharge planning Services  CM Consult  Post Acute Care Choice:    Choice offered to:     DME Arranged:    DME Agency:     HH Arranged:    HH Agency:     Status of Service:  Completed, signed off  If discussed at [employer] Length of Stay Meetings, dates discussed:    Additional Comments:  Glennon Macmerson, Elisa Sorlie M, RN 05/12/2017, 4:25 PM

## 2017-05-12 NOTE — Discharge Summary (Signed)
Stroke Discharge Summary  Patient ID: Diane Keller   MRN: [id number]      DOB: 10-10-1976  Date of Admission: 05/09/2017 Date of Discharge: 05/12/2017  Attending Physician:  Micki Riley, MD, Stroke MD Consultant(s):    Neurosurgery Patient's PCP:  Patient, No Pcp Per  DISCHARGE DIAGNOSIS: Principal Problem:   - right basal ganglia hemorrhage Active Problems:   Hypertensive emergency   Morbid obesity [facility])   Past Medical History:  Diagnosis Date  . Hypertension    Past Surgical History:  Procedure Laterality Date  . CESAREAN SECTION      Allergies as of 05/12/2017   No Known Allergies     Medication List    STOP taking these medications   cloNIDine 0.1 MG tablet Commonly known as:  CATAPRES     TAKE these medications   labetalol 300 MG tablet Commonly known as:  NORMODYNE Take 0.5 tablets (150 mg total) by mouth 2 (two) times daily.   lisinopril 20 MG tablet Commonly known as:  PRINIVIL,ZESTRIL Take 1 tablet (20 mg total) by mouth daily. Start taking on:  05/13/2017   omeprazole 20 MG capsule Commonly known as:  PRILOSEC TAKE 1 CAPSULE EVERY DAY   SUBOXONE 8-2 MG Film Generic drug:  Buprenorphine HCl-Naloxone HCl Place 2 Film under the tongue daily.       LABORATORY STUDIES CBC    Component Value Date/Time   WBC 6.5 05/09/2017 1027   RBC 3.69 (L) 05/09/2017 1027   HGB 10.9 (L) 05/09/2017 1027   HGB 11.2 (L) 05/11/2012 1254   HCT 31.7 (L) 05/09/2017 1027   HCT 34.5 (L) 05/11/2012 1254   PLT 302 05/09/2017 1027   PLT 268 05/11/2012 1254   MCV 86.0 05/09/2017 1027   MCV 88 05/11/2012 1254   MCH 29.6 05/09/2017 1027   MCHC 34.5 05/09/2017 1027   RDW 15.3 (H) 05/09/2017 1027   RDW 16.1 (H) 05/11/2012 1254   LYMPHSABS 1.6 05/09/2017 1027   LYMPHSABS 2.8 05/11/2012 1254   MONOABS 0.4 05/09/2017 1027   MONOABS 0.4 05/11/2012 1254   EOSABS 0.6 05/09/2017 1027   EOSABS 0.6 05/11/2012 1254   BASOSABS 0.0 05/09/2017 1027   BASOSABS 0.1  05/11/2012 1254   CMP    Component Value Date/Time   NA 137 05/09/2017 1027   NA 140 05/11/2012 1254   K 2.8 (L) 05/09/2017 1027   K 3.2 (L) 05/11/2012 1254   CL 105 05/09/2017 1027   CL 105 05/11/2012 1254   CO2 24 05/09/2017 1027   CO2 23 05/11/2012 1254   GLUCOSE 102 (H) 05/09/2017 1027   GLUCOSE 86 05/11/2012 1254   BUN 11 05/09/2017 1027   BUN 11 05/11/2012 1254   CREATININE 0.87 05/09/2017 1027   CREATININE 1.16 05/11/2012 1254   CALCIUM 9.1 05/09/2017 1027   CALCIUM 9.2 05/11/2012 1254   PROT 8.0 05/09/2017 1027   PROT 7.8 05/11/2012 1254   ALBUMIN 4.0 05/09/2017 1027   ALBUMIN 3.7 05/11/2012 1254   AST 18 05/09/2017 1027   AST 17 05/11/2012 1254   ALT 9 (L) 05/09/2017 1027   ALT 14 05/11/2012 1254   ALKPHOS 90 05/09/2017 1027   ALKPHOS 134 05/11/2012 1254   BILITOT 0.4 05/09/2017 1027   BILITOT 0.3 05/11/2012 1254   GFRNONAA >60 05/09/2017 1027   GFRNONAA 60 (L) 05/11/2012 1254   GFRAA >60 05/09/2017 1027   GFRAA >60 05/11/2012 1254   COAGS Lab Results  Component Value  Date   INR 1.01 05/09/2017   Lipid Panel    Component Value Date/Time   CHOL 150 05/11/2017 0535   TRIG 77 05/11/2017 0535   HDL 41 05/11/2017 0535   CHOLHDL 3.7 05/11/2017 0535   VLDL 15 05/11/2017 0535   LDLCALC 94 05/11/2017 0535   HgbA1C  Lab Results  Component Value Date   HGBA1C 5.4 05/11/2017   Urinalysis    Component Value Date/Time   COLORURINE Yellow 05/11/2012 1513   APPEARANCEUR Cloudy 05/11/2012 1513   LABSPEC 1.020 05/11/2012 1513   PHURINE 8.0 05/11/2012 1513   GLUCOSEU Negative 05/11/2012 1513   HGBUR 3+ 05/11/2012 1513   BILIRUBINUR Negative 05/11/2012 1513   KETONESUR Negative 05/11/2012 1513   PROTEINUR 100 mg/dL 16/08/9603 5409   NITRITE Negative 05/11/2012 1513   LEUKOCYTESUR Negative 05/11/2012 1513   Urine Drug Screen     Component Value Date/Time   LABOPIA POSITIVE 05/11/2012 1513   COCAINSCRNUR NEGATIVE 05/11/2012 1513   LABBENZ NEGATIVE  05/11/2012 1513   AMPHETMU NEGATIVE 05/11/2012 1513   THCU NEGATIVE 05/11/2012 1513   LABBARB NEGATIVE 05/11/2012 1513    Alcohol Level No results found for: ETH   SIGNIFICANT DIAGNOSTIC STUDIES Ct Head Wo Contrast 05/10/2017 IMPRESSION: 1. Stable acute hemorrhage within the right basal ganglia, surrounding edema, and mild local mass effect. No new acute intracranial hemorrhage identified. No herniation. 2. Diffuse paranasal sinus disease with fluid levels compatible with acute sinusitis in the appropriate clinical setting.  Ct Head Code Stroke W/o Cm 05/09/2017 IMPRESSION: 1. 1.9 x 2 x 2.5 cm hemorrhage in the right basal ganglia/ external capsule region with a thin rim of surrounding edema, suggesting that this may be 86-38 days old. No mass-effect or shift. Most typical of either a hypertensive hemorrhage or hemorrhagic infarction. The remainder the brain is negative. 2. No evidence of any large vessel occlusion findings.  MRI Brain MRA  05/11/2017 1. Stable hemorrhage within right basal ganglia given differences in technique. 2. 1 cm focus of acute/ early subacute infarction medial to the hemorrhage within posterior limb of internal capsule. 3. T2 FLAIR hyperintense foci in white matter are nonspecific, but probably represent chronic microvascular ischemic changes given history of hypertension. 4. Severe paranasal sinus disease with fluid levels which may represent acute sinusitis in the appropriate clinical setting. 5. Normal MRA of the head.       HISTORY OF PRESENT ILLNESS Diane Granados Brownis an 41 y.o.femalewith a history of HTN, noncompliant with antihypertensives for the past 8 months, who presented with left-sided weakness, left facial droop, and slurred speech on 05/09/2017.  She reports awakening 05/09/2017 with dysequilibrium and with left-sided weakness.  She reports dropping many things from her left hand.  She went to the kitchen where she was having difficulty as well  and called her husband. While on the phone noted that her speech was slurred and that the left side of her mouth was not moving normally.  Initial NIHSS of 4.  Initial CT head showed no stroke.  Repeat CT head on showed small acute right basal ganglia hemorrhage.  MRI head pending.  Patient was not administered IV t-PA secondary to ICH. She was admitted to the neuro ICU for further evaluation and treatment.    HOSPITAL COURSE Diane Keller is a 41 y.o. female with history of  HTN, noncompliant with antihypertensives for the past 8 months presenting with eft-sided weakness, left facial droop, and slurred speech. She did not receive IV t-PA due  to ICH.   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  1 cm focus of acute/ early subacute infarction medial to the hemorrhage within posterior limb of internal capsule.  MRA: Normal MRA of the head.             2DEcho : Left ventricle: The cavity size was normal. There was mild concentric hypertrophy. Systolic function was normal. The estimated ejection fraction was in the range of 60% to 65%. Wall motion was normal; there were no regional wall motion abnormalities  LDL 94  HgbA1c 5.4  No antithrombotic prior to admission, now on No antithrombotic  Ongoing aggressive stroke risk factor management  Therapy recommendations: Outpatient PT;Supervision - Intermittent  Disposition: home  Hypertension  Treated with IV nicardipine in the hospital  Stop home PO clonidine  Start lisinopril 20mg  PO daily and labetalol 150 mg PO BID  Blood pressure goal normotensive   Hyperlipidemia  Home meds: none  LDL 94, goal < 70  Add atorvastatin 40mg  daily if LDL not at goal and continue statin at discharge  Diabetes  HgbA1c  5.4, goal < 7.0  Controlled  Other Stroke Risk Factors  Cigarette smoker, advised to stop smoking  Morbid obesity, Body mass index is 45.87 kg/m., recommend weight loss, diet and exercise as appropriate   Other Active Problems   Medication non-compliance  DISCHARGE EXAM Blood pressure (!) 168/93, pulse 81, temperature 99 F (37.2 C), temperature source Oral, resp. rate (!) 27, height 5\' 6"  (1.676 m), weight 128.9 kg (284 lb 2.8 oz), last menstrual period 05/04/2017, SpO2 95 %. Obese middle aged african american lady not in distress. . Afebrile. Head is nontraumatic. Neck is supple without bruit.    Cardiac exam no murmur or gallop. Lungs are clear to auscultation. Distal pulses are well felt. Neurological Exam :  Awake alert oriented x 3 normal speech and language. Mild left lower face asymmetry. Tongue midline. No drift. Mild diminished fine finger movements on left. Orbits right over left upper extremity. Mild left grip weak.. Normal sensation . Normal coordination. Gait not tested  Discharge Diet   Diet heart healthy/carb modified Room service appropriate? Yes; Fluid consistency: Thin liquids  DISCHARGE PLAN  Disposition:  Home with outpatient PT  Ongoing risk factor control by Primary Care Physician at time of discharge  Follow-up with Primary care physician in 2 weeks.  Follow-up with Delia HeadyPramod Sethi, Stroke Clinic in 6 weeks, office to schedule an appointment.  36 minutes were spent preparing discharge.  Marton RedwoodSamuel A Patteson, NP   I have personally examined this patient, reviewed notes, independently viewed imaging studies, participated in medical decision making and plan of care.ROS completed by me personally and pertinent positives fully documented  I have made any additions or clarifications directly to the above note. Agree with note above.    Delia HeadyPramod Sethi, MD Medical Director [facility] Pager: [phone] 05/12/2017 6:20 PM

## 2017-05-12 NOTE — Progress Notes (Signed)
STROKE TEAM PROGRESS NOTE   HISTORY OF PRESENT ILLNESS (per record) Diane Keller is an 41 y.o. female with a history of HTN, noncompliant with antihypertensives for the past 8 months, who presented with left-sided weakness, left facial droop, and slurred speech on 05/09/2017.  She reports awakening 05/09/2017 with dysequilibrium and with left-sided weakness.  She reports dropping many things from her left hand.  She went to the kitchen where she was having difficulty as well and called her husband. While on the phone noted that her speech was slurred and that the left side of her mouth was not moving normally.  Initial NIHSS of 4.  Initial CT head showed no stroke.  Repeat CT head on showed small acute right basal ganglia hemorrhage.  MRI head pending.  Patient was not administered IV t-PA secondary to ICH. She was admitted to the neuro ICU for further evaluation and treatment.   SUBJECTIVE (INTERVAL HISTORY) Her husband and child are at the bedside.  The patient is alert, oriented, and follows all commands appropriately.  Her blood pressure is adequately controlled. MRI scan of the brain shows stable appearance of the right basal ganglia hemorrhage with a 1 cm area of diffusion abnormality medial to it raising possibility of hemorrhagic infarct but MRA of the brain is normal   OBJECTIVE Temp:  [98.2 F (36.8 C)-99.4 F (37.4 C)] 98.2 F (36.8 C) (06/29 0400) Pulse Rate:  [73-98] 79 (06/29 0800) Cardiac Rhythm: Normal sinus rhythm (06/29 0800) Resp:  [12-32] 24 (06/29 0800) BP: (113-201)/(66-129) 139/82 (06/29 0800) SpO2:  [91 %-98 %] 92 % (06/29 0800)  CBC:   Recent Labs Lab 05/09/17 1027  WBC 6.5  NEUTROABS 3.8  HGB 10.9*  HCT 31.7*  MCV 86.0  PLT 302    Basic Metabolic Panel:   Recent Labs Lab 05/09/17 1027  NA 137  K 2.8*  CL 105  CO2 24  GLUCOSE 102*  BUN 11  CREATININE 0.87  CALCIUM 9.1    Lipid Panel:     Component Value Date/Time   CHOL 150 05/11/2017  0535   TRIG 77 05/11/2017 0535   HDL 41 05/11/2017 0535   CHOLHDL 3.7 05/11/2017 0535   VLDL 15 05/11/2017 0535   LDLCALC 94 05/11/2017 0535   HgbA1c:  Lab Results  Component Value Date   HGBA1C 5.4 05/11/2017   Urine Drug Screen:     Component Value Date/Time   LABOPIA POSITIVE 05/11/2012 1513   COCAINSCRNUR NEGATIVE 05/11/2012 1513   LABBENZ NEGATIVE 05/11/2012 1513   AMPHETMU NEGATIVE 05/11/2012 1513   THCU NEGATIVE 05/11/2012 1513   LABBARB NEGATIVE 05/11/2012 1513    Alcohol Level No results found for: ETH  IMAGING  Ct Head Wo Contrast 05/10/2017 IMPRESSION: 1. Stable acute hemorrhage within the right basal ganglia, surrounding edema, and mild local mass effect. No new acute intracranial hemorrhage identified. No herniation. 2. Diffuse paranasal sinus disease with fluid levels compatible with acute sinusitis in the appropriate clinical setting.  Ct Head Code Stroke W/o Cm 05/09/2017 IMPRESSION: 1. 1.9 x 2 x 2.5 cm hemorrhage in the right basal ganglia/ external capsule region with a thin rim of surrounding edema, suggesting that this may be 55-27 days old. No mass-effect or shift. Most typical of either a hypertensive hemorrhage or hemorrhagic infarction. The remainder the brain is negative. 2. No evidence of any large vessel occlusion findings.  MRI Brain MRA  05/11/2017 1. Stable hemorrhage within right basal ganglia given differences in technique. 2. 1  cm focus of acute/ early subacute infarction medial to the hemorrhage within posterior limb of internal capsule. 3. T2 FLAIR hyperintense foci in white matter are nonspecific, but probably represent chronic microvascular ischemic changes given history of hypertension. 4. Severe paranasal sinus disease with fluid levels which may represent acute sinusitis in the appropriate clinical setting. 5. Normal MRA of the head.      PHYSICAL EXAM Obese middle aged african american lady not in distress. . Afebrile. Head is  nontraumatic. Neck is supple without bruit.    Cardiac exam no murmur or gallop. Lungs are clear to auscultation. Distal pulses are well felt. Neurological Exam :  Awake alert oriented x 3 normal speech and language. Mild left lower face asymmetry. Tongue midline. No drift. Mild diminished fine finger movements on left. Orbits right over left upper extremity. Mild left grip weak.. Normal sensation . Normal coordination. Gait not tested ASSESSMENT/PLAN Diane Keller is a 41 y.o. female with history of  HTN, noncompliant with antihypertensives for the past 8 months presenting with eft-sided weakness, left facial droop, and slurred speech. She did not receive IV t-PA due to ICH.   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  1 cm focus of acute/ early subacute infarction medial to the hemorrhage within posterior limb of internal capsule.  MRA: Normal MRA of the head.  2DEcho : Left ventricle: The cavity size was normal. There was mild   concentric hypertrophy. Systolic function was normal. The   estimated ejection fraction was in the range of 60% to 65%. Wall   motion was normal; there were no regional wall motion   abnormalities  LDL 94  HgbA1c 5.4  SCDs for VTE prophylaxis Diet heart healthy/carb modified Room service appropriate? Yes; Fluid consistency: Thin  No antithrombotic prior to admission, now on No antithrombotic  Ongoing aggressive stroke risk factor management  Therapy recommendations:  pending  Disposition:   pending  Hypertension  Stable  Goal SBP < 140 mmHg  Long-term BP goal normotensive  Patient restarted on home dose of labetalol 150 mg PO BID  Hyperlipidemia  Home meds:  none  LDL  pending, goal < 70  Add atorvastatin 40mg  daily if LDL not at  goal and continue statin at discharge  Diabetes  HgbA1c 5.4, goal < 7.0  Controlled  Other Stroke Risk Factors  Cigarette smoker, advised to stop smoking  Morbid obesity, Body mass index is 45.87 kg/m., recommend weight loss, diet and exercise as appropriate   Other Active Problems   Medication non-compliance  Hospital day # 3   Long discussion at the bedside with the patient and husband and answered questions. Patient is doing well and will be discharge home today and need outpatient physical occupational therapy.. Follow-up as an outpatient in the stroke clinic in 6 weeks. She was advised to be compliant with her blood pressure medications and make lifestyle changes and maintain aggressive risk factor modification.   Delia HeadyPramod Adilee Lemme, MD Medical Director [facility] Pager: [phone] 05/12/2017 8:17 AM   To contact Stroke Continuity provider, please refer to [email or web address]. After hours, contact General Neurology

## 2017-05-12 NOTE — Progress Notes (Signed)
OT Screen Note  Patient Details Name: Diane HoopsChristie L Hurlbut MRN: [id number] DOB: 12/28/1975   Cancelled Treatment:    Reason Eval/Treat Not Completed: OT screened, no needs identified, will sign off Spoke with PT Vernona RiegerLaura and currently adequate level to dc home  Felecia ShellingJones, Martita Brumm B   Monica Codd, Brynn   OTR/L Pager: [phone] Office: [phone] .  05/12/2017, 7:15 AM

## 2017-05-29 ENCOUNTER — Telehealth: Payer: Self-pay | Admitting: General Practice

## 2017-05-29 NOTE — Telephone Encounter (Signed)
Pt wants a nurse to call her back.  She has not been in several years but wants someone to call in her blood pressure medication.  I told her she would have to see someone before we can prescribe her anything  refill anything  Pt states she is out of her meds and was just in the hospital for a stroke a couple weeks ago.  Pt call back is [phone]  Diane Keller

## 2017-05-29 NOTE — Telephone Encounter (Signed)
Please advise 

## 2017-05-30 NOTE — Telephone Encounter (Signed)
She has enough pills for at least another week per records so will need to make an appointment before running out. If insurance is an issue for an office visit may need to consider a [facility].

## 2017-05-30 NOTE — Telephone Encounter (Signed)
appt made for 05/31/17. KW

## 2017-05-31 ENCOUNTER — Ambulatory Visit: Payer: Self-pay | Admitting: Family Medicine

## 2017-06-05 ENCOUNTER — Ambulatory Visit: Payer: Self-pay | Admitting: Family Medicine

## 2017-06-08 ENCOUNTER — Ambulatory Visit (INDEPENDENT_AMBULATORY_CARE_PROVIDER_SITE_OTHER): Payer: Self-pay | Admitting: Family Medicine

## 2017-06-08 ENCOUNTER — Encounter: Payer: Self-pay | Admitting: Family Medicine

## 2017-06-08 VITALS — BP 136/70 | HR 85 | Temp 98.4°F | Resp 16 | Wt 279.4 lb

## 2017-06-08 DIAGNOSIS — I10 Essential (primary) hypertension: Secondary | ICD-10-CM

## 2017-06-08 DIAGNOSIS — R12 Heartburn: Secondary | ICD-10-CM | POA: Insufficient documentation

## 2017-06-08 MED ORDER — LISINOPRIL 20 MG PO TABS: 20.0000 mg | ORAL_TABLET | Freq: Every day | ORAL | 1 refills | Status: DC

## 2017-06-08 MED ORDER — OMEPRAZOLE 20 MG PO CPDR: DELAYED_RELEASE_CAPSULE | ORAL | 2 refills | Status: DC

## 2017-06-08 MED ORDER — LABETALOL HCL 300 MG PO TABS: 300.0000 mg | ORAL_TABLET | Freq: Two times a day (BID) | ORAL | 1 refills | Status: DC

## 2017-06-08 NOTE — Progress Notes (Signed)
Subjective:     Patient ID: Diane Keller, female   DOB: 12/16/1975, 41 y.o.   MRN: [id number]  HPI  Chief Complaint  Patient presents with  . Hypertension    Patient comes in office today to follow up for hypertension, patient reports that she has had good compliance and tolerance on Lisinopril.   Had a hemorrhagic stroke with little residual deficit on 05/09/17. Reports bp's at home up to 160/90. Sees an addiction specialist at [facility]. Also reports occasional heartburn and wishes to refill omeprazole for prn use. She is pending Medicaid.   Review of Systems     Objective:   Physical Exam  Constitutional: She appears well-developed and well-nourished. No distress.  Cardiovascular: Normal rate and regular rhythm.   Pulmonary/Chest: Breath sounds normal.  Musculoskeletal: She exhibits no edema (of lower extremities).  Psychiatric:  restless       Assessment:    1. Essential hypertension: increased medication dosage for improved control - lisinopril (PRINIVIL,ZESTRIL) 20 MG tablet; Take 1 tablet (20 mg total) by mouth daily.  Dispense: 90 tablet; Refill: 1 - labetalol (NORMODYNE) 300 MG tablet; Take 1 tablet (300 mg total) by mouth 2 (two) times daily.  Dispense: 180 tablet; Refill: 1  2. Heartburn - omeprazole (PRILOSEC) 20 MG capsule; One pill daily as needed for heartburn.  Dispense: 30 capsule; Refill: 2    Plan:    Nurse bp check in two weeks. HOB elevation for heartburn/reflux.

## 2017-06-08 NOTE — Patient Instructions (Signed)
Return for nurse bp check in two weeks. Put legs of head of bed on 6 inch blocks to minimize reflux.

## 2017-06-12 ENCOUNTER — Telehealth: Payer: Self-pay | Admitting: Family Medicine

## 2017-06-12 NOTE — Telephone Encounter (Signed)
Pt needs pharmacy to have clarification on her   Labtalol 200 twice a day per visit with you last week.  She said her rx still reads 300mg  from the pharmacy    [facility]  Her call [phone]  Please call patient back and let her know that it has been changed.  thanks Fortune Brandsteri

## 2017-06-13 NOTE — Telephone Encounter (Signed)
Pt reports she had always taken taken 200 mg BID, and her BP was slightly above goal when taking 150 mg BID, so you agreed to keep her on 200 mg BID. Pt requesting call back from you personally if you still have questions or concerns.

## 2017-06-13 NOTE — Telephone Encounter (Signed)
Spoke with pt who reports that she I supposed to be taking labetalol 200 mg BID instead of 300 mg BID. Per pt, you verbally advised her that her med will be increased to 200 mg BID. [facility].

## 2017-06-13 NOTE — Telephone Encounter (Signed)
Discussed-she will continue with 300 mg. Bid.

## 2017-06-13 NOTE — Telephone Encounter (Signed)
She was discharged from the hospital on 300 mg. 1/2 pill twice daily. I increased her to a whole 300 mg pill twice daily. I would stay with that dose.

## 2017-07-27 ENCOUNTER — Telehealth: Payer: Self-pay

## 2017-07-27 ENCOUNTER — Ambulatory Visit: Payer: MEDICAID | Admitting: Neurology

## 2017-07-27 NOTE — Telephone Encounter (Signed)
Patient no show for appt today. 

## 2017-07-31 ENCOUNTER — Encounter: Payer: Self-pay | Admitting: Neurology

## 2017-11-27 ENCOUNTER — Other Ambulatory Visit: Payer: Self-pay | Admitting: Family Medicine

## 2017-11-27 DIAGNOSIS — I10 Essential (primary) hypertension: Secondary | ICD-10-CM

## 2017-11-27 DIAGNOSIS — R12 Heartburn: Secondary | ICD-10-CM

## 2017-12-30 ENCOUNTER — Other Ambulatory Visit: Payer: Self-pay | Admitting: Family Medicine

## 2017-12-30 DIAGNOSIS — I10 Essential (primary) hypertension: Secondary | ICD-10-CM

## 2018-03-28 ENCOUNTER — Other Ambulatory Visit: Payer: Self-pay | Admitting: Family Medicine

## 2018-03-28 DIAGNOSIS — R12 Heartburn: Secondary | ICD-10-CM

## 2018-07-13 ENCOUNTER — Telehealth: Payer: Self-pay | Admitting: General Practice

## 2018-07-13 ENCOUNTER — Other Ambulatory Visit: Payer: Self-pay | Admitting: Family Medicine

## 2018-07-13 DIAGNOSIS — I10 Essential (primary) hypertension: Secondary | ICD-10-CM

## 2018-07-13 MED ORDER — LABETALOL HCL 300 MG PO TABS: 300.0000 mg | ORAL_TABLET | Freq: Two times a day (BID) | ORAL | 0 refills | Status: DC

## 2018-07-13 NOTE — Telephone Encounter (Signed)
Pt needing refill of labetalol 300MG  [facility].  States she has been out for a day and she is leaving to go out of town within the next few hours for family reunion and needs filled before she leaves.    Pt made an appt for 07/17/18

## 2018-07-13 NOTE — Telephone Encounter (Signed)
Refill completed.

## 2018-07-13 NOTE — Telephone Encounter (Signed)
Ok to refill? Please advise. Thanks!  

## 2018-07-17 ENCOUNTER — Ambulatory Visit: Payer: Self-pay | Admitting: Family Medicine

## 2018-10-07 ENCOUNTER — Other Ambulatory Visit: Payer: Self-pay | Admitting: Family Medicine

## 2018-10-07 DIAGNOSIS — I10 Essential (primary) hypertension: Secondary | ICD-10-CM

## 2018-10-10 ENCOUNTER — Telehealth: Payer: Self-pay | Admitting: Family Medicine

## 2018-10-10 ENCOUNTER — Other Ambulatory Visit: Payer: Self-pay | Admitting: Family Medicine

## 2018-10-10 DIAGNOSIS — I10 Essential (primary) hypertension: Secondary | ICD-10-CM

## 2018-10-10 MED ORDER — LISINOPRIL 20 MG PO TABS: 20.0000 mg | ORAL_TABLET | Freq: Every day | ORAL | 0 refills | Status: DC

## 2018-10-10 NOTE — Telephone Encounter (Signed)
Last filled 12/31/17. KW

## 2018-10-10 NOTE — Telephone Encounter (Signed)
done

## 2018-10-10 NOTE — Telephone Encounter (Signed)
Pt contacted office for refill request on the following medications:  lisinopril (PRINIVIL,ZESTRIL) 20 MG tablet  [facility]  Please advise. Thanks TNP

## 2018-10-16 ENCOUNTER — Ambulatory Visit: Payer: Medicaid Other | Admitting: Family Medicine

## 2018-10-19 ENCOUNTER — Ambulatory Visit: Payer: Medicaid Other | Admitting: Family Medicine

## 2018-12-25 ENCOUNTER — Other Ambulatory Visit: Payer: Self-pay | Admitting: Family Medicine

## 2018-12-25 DIAGNOSIS — I10 Essential (primary) hypertension: Secondary | ICD-10-CM

## 2019-01-04 ENCOUNTER — Other Ambulatory Visit: Payer: Self-pay | Admitting: Family Medicine

## 2019-01-04 DIAGNOSIS — I10 Essential (primary) hypertension: Secondary | ICD-10-CM

## 2019-02-27 ENCOUNTER — Telehealth: Payer: Self-pay

## 2019-02-27 ENCOUNTER — Other Ambulatory Visit: Payer: Self-pay

## 2019-02-27 DIAGNOSIS — I10 Essential (primary) hypertension: Secondary | ICD-10-CM

## 2019-02-27 NOTE — Telephone Encounter (Signed)
Patient has been seeing Nadine Counts.  She is out of her blood pressure medicine and will need refill.  She was advised that she will need to be seen by someone else in the office since she has not been in since 05/2017.  Patient wants to know if you will let her re-establish with you and if you can give her Rx until seen  CB# [phone]

## 2019-02-28 NOTE — Telephone Encounter (Signed)
Duplicate

## 2019-03-01 MED ORDER — LABETALOL HCL 300 MG PO TABS: 300.0000 mg | ORAL_TABLET | Freq: Two times a day (BID) | ORAL | 0 refills | Status: DC

## 2019-03-01 NOTE — Telephone Encounter (Signed)
Have sent refill for labetalol. She should still have lisinopril until mid may. Need to make o.v. in 3-4 week.

## 2019-03-01 NOTE — Addendum Note (Signed)
Addended by: Malva Limes on: 03/01/2019 10:43 AM   Modules accepted: Orders

## 2019-03-01 NOTE — Telephone Encounter (Signed)
Pt advised.  Apt made for 03/22/2019 at 10:40AM  Thanks,   -Vernona Rieger

## 2019-03-04 ENCOUNTER — Other Ambulatory Visit: Payer: Self-pay | Admitting: Family Medicine

## 2019-03-04 DIAGNOSIS — R12 Heartburn: Secondary | ICD-10-CM

## 2019-03-04 MED ORDER — OMEPRAZOLE 20 MG PO CPDR: 20.0000 mg | DELAYED_RELEASE_CAPSULE | Freq: Every day | ORAL | 1 refills | Status: DC

## 2019-03-04 NOTE — Telephone Encounter (Signed)
[facility] the following medications:  omeprazole (PRILOSEC) 20 MG capsule  Pt was seeing Nadine Counts and is scheduled to establish care with Dr. Sherrie Mustache May 2020. Please advise. Thanks TNP

## 2019-03-04 NOTE — Telephone Encounter (Signed)
Last office visit was 06/08/2017. Is it ok to refill mediation. Please advise.

## 2019-03-22 ENCOUNTER — Ambulatory Visit: Payer: Medicaid Other | Admitting: Family Medicine

## 2019-03-27 ENCOUNTER — Other Ambulatory Visit: Payer: Self-pay | Admitting: Family Medicine

## 2019-03-27 DIAGNOSIS — I10 Essential (primary) hypertension: Secondary | ICD-10-CM

## 2019-03-29 ENCOUNTER — Other Ambulatory Visit: Payer: Self-pay

## 2019-03-29 ENCOUNTER — Ambulatory Visit (INDEPENDENT_AMBULATORY_CARE_PROVIDER_SITE_OTHER): Payer: Medicaid Other | Admitting: Family Medicine

## 2019-03-29 ENCOUNTER — Encounter: Payer: Self-pay | Admitting: Family Medicine

## 2019-03-29 VITALS — BP 136/87 | HR 79 | Temp 99.7°F | Wt 256.4 lb

## 2019-03-29 DIAGNOSIS — I10 Essential (primary) hypertension: Secondary | ICD-10-CM

## 2019-03-29 NOTE — Progress Notes (Signed)
Patient: Diane Keller Female    DOB: [birth year]   43 y.o.   MRN: [id number] Visit Date: 03/29/2019  Today's Provider: Mila Merry, MD   Chief Complaint  Patient presents with  . Hypertension   Subjective:    HPI  Hypertension, follow-up:  BP Readings from Last 3 Encounters:  03/29/19 136/87  06/08/17 136/70  03/27/15 118/86    She was last seen for hypertension 2 years ago.  BP at that visit was 136/70. Management changes since that visit include none. She reports good compliance with treatment. She is not having side effects.  She is exercising. She is adherent to low salt diet.    She is experiencing none.  Patient denies chest pain, chest pressure/discomfort, exertional chest pressure/discomfort, fatigue, irregular heart beat, lower extremity edema, palpitations and tachypnea.   Cardiovascular risk factors include hypertension.  Use of agents associated with hypertension: none.     Weight trend: fluctuating a lot Wt Readings from Last 3 Encounters:  03/29/19 256 lb 6.4 oz (116.3 kg)  06/08/17 279 lb 6.4 oz (126.7 kg)  03/27/15 270 lb (122.5 kg)    Current diet: well balanced  ------------------------------------------------------------------------  Allergies  Allergen Reactions  . Augmentin [Amoxicillin-Pot Clavulanate] Itching  . Lisinopril Other (See Comments)    Cough     Current Outpatient Medications:  .  Buprenorphine HCl-Naloxone HCl (SUBOXONE) 8-2 MG FILM, Place 2 Film under the tongue daily., Disp: , Rfl:  .  labetalol (NORMODYNE) 300 MG tablet, TAKE 1 TABLET (300 MG TOTAL) BY MOUTH 2 (TWO) TIMES DAILY., Disp: 60 tablet, Rfl: 1 .  lisinopril (PRINIVIL,ZESTRIL) 20 MG tablet, TAKE 1 TABLET BY MOUTH EVERY DAY, Disp: 90 tablet, Rfl: 0 .  omeprazole (PRILOSEC) 20 MG capsule, Take 1 capsule (20 mg total) by mouth daily., Disp: 30 capsule, Rfl: 1  Review of Systems  Constitutional: Negative.   Respiratory: Negative.   Genitourinary:  Negative.   Neurological: Negative.     Social History   Tobacco Use  . Smoking status: Former Smoker    Types: Cigarettes  . Smokeless tobacco: Never Used  Substance Use Topics  . Alcohol use: Yes      Objective:   BP 136/87 (BP Location: Right Arm, Patient Position: Sitting, Cuff Size: Large)   Pulse 79   Temp 99.7 F (37.6 C) (Oral)   Wt 256 lb 6.4 oz (116.3 kg)   LMP 03/15/2019   BMI 41.38 kg/m  Vitals:   03/29/19 1332  BP: 136/87  Pulse: 79  Temp: 99.7 F (37.6 C)  TempSrc: Oral  Weight: 256 lb 6.4 oz (116.3 kg)     Physical Exam  General Appearance:    Alert, cooperative, no distress, obese  Eyes:    PERRL, conjunctiva/corneas clear, EOM's intact       Lungs:     Clear to auscultation bilaterally, respirations unlabored  Heart:    Regular rate and rhythm  Neurologic:   Awake, alert, oriented x 3. No apparent focal neurological           defect.           Assessment & Plan    1. Essential hypertension Well controlled.  Continue current medications.   - Comprehensive metabolic panel - Lipid panel  2. Morbid obesity (HCC) Has lost 24 pounds since last visit in 2018 which she was congratulated on. Encouraged to continue healthy diet and exercising and continue working on weight loss to be BMI  below 30.   She states she had physical pap and pelvic at [facility] a few years ago, but I could not find any records of this. Counseled that she needs to continue the every 3-5 years and to either schedule one here of with a gyn of her choice.    The entirety of the information documented in the History of Present Illness, Review of Systems and Physical Exam were personally obtained by me. Portions of this information were initially documented by [facility] CMA,and reviewed by me for thoroughness and accuracy.  Mila Merryonald , MD  [facility]

## 2019-03-29 NOTE — Patient Instructions (Signed)
.   Please review the attached list of medications and notify my office if there are any errors.   . Please bring all of your medications to every appointment so we can make sure that our medication list is the same as yours.   . Please go to the lab draw station in [address] on the second floor of [facility]. Normal hours are 8:00am to 12:30pm and 1:30pm to 4:00pm Monday through Friday   

## 2019-04-09 ENCOUNTER — Other Ambulatory Visit: Payer: Self-pay | Admitting: Family Medicine

## 2019-04-09 DIAGNOSIS — I10 Essential (primary) hypertension: Secondary | ICD-10-CM

## 2019-04-09 MED ORDER — LISINOPRIL 20 MG PO TABS: 20.0000 mg | ORAL_TABLET | Freq: Every day | ORAL | 1 refills | Status: DC

## 2019-04-09 NOTE — Telephone Encounter (Signed)
[facility] faxed refill request for the following medications:  lisinopril (PRINIVIL,ZESTRIL) 20 MG tablet  90 day supply  LOV: 03/29/2019 with Dr. Sherrie Mustache pt was seeing Nadine Counts. Please advise. Thanks TNP

## 2019-05-06 ENCOUNTER — Other Ambulatory Visit: Payer: Self-pay | Admitting: Family Medicine

## 2019-05-06 DIAGNOSIS — R12 Heartburn: Secondary | ICD-10-CM

## 2019-06-13 ENCOUNTER — Other Ambulatory Visit: Payer: Self-pay | Admitting: Family Medicine

## 2019-06-13 DIAGNOSIS — I10 Essential (primary) hypertension: Secondary | ICD-10-CM

## 2019-11-11 DIAGNOSIS — Z20828 Contact with and (suspected) exposure to other viral communicable diseases: Secondary | ICD-10-CM | POA: Diagnosis not present

## 2019-11-11 DIAGNOSIS — R05 Cough: Secondary | ICD-10-CM | POA: Diagnosis not present

## 2019-11-25 DIAGNOSIS — Z20828 Contact with and (suspected) exposure to other viral communicable diseases: Secondary | ICD-10-CM | POA: Diagnosis not present

## 2019-11-25 DIAGNOSIS — R05 Cough: Secondary | ICD-10-CM | POA: Diagnosis not present

## 2019-12-10 ENCOUNTER — Other Ambulatory Visit: Payer: Self-pay | Admitting: Family Medicine

## 2019-12-10 DIAGNOSIS — R12 Heartburn: Secondary | ICD-10-CM

## 2020-03-26 ENCOUNTER — Inpatient Hospital Stay
Admission: EM | Admit: 2020-03-26 | Discharge: 2020-04-16 | DRG: 871 | Disposition: A | Discharge status: Home / Self Care | Payer: Medicaid Other | Attending: Internal Medicine | Admitting: Internal Medicine

## 2020-03-26 ENCOUNTER — Other Ambulatory Visit: Payer: Self-pay

## 2020-03-26 ENCOUNTER — Emergency Department: Payer: Medicaid Other

## 2020-03-26 DIAGNOSIS — B9620 Unspecified Escherichia coli [E. coli] as the cause of diseases classified elsewhere: Secondary | ICD-10-CM | POA: Diagnosis present

## 2020-03-26 DIAGNOSIS — J1282 Pneumonia due to coronavirus disease 2019: Secondary | ICD-10-CM

## 2020-03-26 DIAGNOSIS — R4182 Altered mental status, unspecified: Secondary | ICD-10-CM | POA: Diagnosis not present

## 2020-03-26 DIAGNOSIS — I272 Pulmonary hypertension, unspecified: Secondary | ICD-10-CM | POA: Diagnosis present

## 2020-03-26 DIAGNOSIS — Z888 Allergy status to other drugs, medicaments and biological substances status: Secondary | ICD-10-CM

## 2020-03-26 DIAGNOSIS — A419 Sepsis, unspecified organism: Secondary | ICD-10-CM | POA: Diagnosis not present

## 2020-03-26 DIAGNOSIS — R571 Hypovolemic shock: Secondary | ICD-10-CM | POA: Diagnosis not present

## 2020-03-26 DIAGNOSIS — T502X5A Adverse effect of carbonic-anhydrase inhibitors, benzothiadiazides and other diuretics, initial encounter: Secondary | ICD-10-CM | POA: Diagnosis not present

## 2020-03-26 DIAGNOSIS — E872 Acidosis: Secondary | ICD-10-CM | POA: Diagnosis not present

## 2020-03-26 DIAGNOSIS — I10 Essential (primary) hypertension: Secondary | ICD-10-CM | POA: Diagnosis not present

## 2020-03-26 DIAGNOSIS — J9600 Acute respiratory failure, unspecified whether with hypoxia or hypercapnia: Secondary | ICD-10-CM | POA: Diagnosis not present

## 2020-03-26 DIAGNOSIS — D594 Other nonautoimmune hemolytic anemias: Secondary | ICD-10-CM | POA: Diagnosis not present

## 2020-03-26 DIAGNOSIS — D509 Iron deficiency anemia, unspecified: Secondary | ICD-10-CM | POA: Diagnosis not present

## 2020-03-26 DIAGNOSIS — N390 Urinary tract infection, site not specified: Secondary | ICD-10-CM | POA: Diagnosis not present

## 2020-03-26 DIAGNOSIS — R404 Transient alteration of awareness: Secondary | ICD-10-CM | POA: Diagnosis not present

## 2020-03-26 DIAGNOSIS — J189 Pneumonia, unspecified organism: Secondary | ICD-10-CM

## 2020-03-26 DIAGNOSIS — E874 Mixed disorder of acid-base balance: Secondary | ICD-10-CM | POA: Diagnosis not present

## 2020-03-26 DIAGNOSIS — R6521 Severe sepsis with septic shock: Secondary | ICD-10-CM | POA: Diagnosis not present

## 2020-03-26 DIAGNOSIS — F419 Anxiety disorder, unspecified: Secondary | ICD-10-CM | POA: Diagnosis present

## 2020-03-26 DIAGNOSIS — Z823 Family history of stroke: Secondary | ICD-10-CM

## 2020-03-26 DIAGNOSIS — J9601 Acute respiratory failure with hypoxia: Secondary | ICD-10-CM

## 2020-03-26 DIAGNOSIS — I5032 Chronic diastolic (congestive) heart failure: Secondary | ICD-10-CM | POA: Diagnosis present

## 2020-03-26 DIAGNOSIS — K219 Gastro-esophageal reflux disease without esophagitis: Secondary | ICD-10-CM | POA: Diagnosis present

## 2020-03-26 DIAGNOSIS — D500 Iron deficiency anemia secondary to blood loss (chronic): Secondary | ICD-10-CM | POA: Diagnosis present

## 2020-03-26 DIAGNOSIS — R0689 Other abnormalities of breathing: Secondary | ICD-10-CM | POA: Diagnosis not present

## 2020-03-26 DIAGNOSIS — J159 Unspecified bacterial pneumonia: Secondary | ICD-10-CM | POA: Diagnosis not present

## 2020-03-26 DIAGNOSIS — Z6841 Body Mass Index (BMI) 40.0 and over, adult: Secondary | ICD-10-CM

## 2020-03-26 DIAGNOSIS — J069 Acute upper respiratory infection, unspecified: Secondary | ICD-10-CM | POA: Diagnosis not present

## 2020-03-26 DIAGNOSIS — Z87891 Personal history of nicotine dependence: Secondary | ICD-10-CM

## 2020-03-26 DIAGNOSIS — R9389 Abnormal findings on diagnostic imaging of other specified body structures: Secondary | ICD-10-CM

## 2020-03-26 DIAGNOSIS — A4189 Other specified sepsis: Secondary | ICD-10-CM | POA: Diagnosis not present

## 2020-03-26 DIAGNOSIS — E876 Hypokalemia: Secondary | ICD-10-CM | POA: Diagnosis not present

## 2020-03-26 DIAGNOSIS — I340 Nonrheumatic mitral (valve) insufficiency: Secondary | ICD-10-CM | POA: Diagnosis not present

## 2020-03-26 DIAGNOSIS — R0602 Shortness of breath: Secondary | ICD-10-CM | POA: Diagnosis not present

## 2020-03-26 DIAGNOSIS — Z8249 Family history of ischemic heart disease and other diseases of the circulatory system: Secondary | ICD-10-CM

## 2020-03-26 DIAGNOSIS — E870 Hyperosmolality and hypernatremia: Secondary | ICD-10-CM | POA: Diagnosis not present

## 2020-03-26 DIAGNOSIS — I361 Nonrheumatic tricuspid (valve) insufficiency: Secondary | ICD-10-CM | POA: Diagnosis not present

## 2020-03-26 DIAGNOSIS — N19 Unspecified kidney failure: Secondary | ICD-10-CM

## 2020-03-26 DIAGNOSIS — G9349 Other encephalopathy: Secondary | ICD-10-CM | POA: Diagnosis not present

## 2020-03-26 DIAGNOSIS — B9729 Other coronavirus as the cause of diseases classified elsewhere: Secondary | ICD-10-CM | POA: Diagnosis not present

## 2020-03-26 DIAGNOSIS — R6520 Severe sepsis without septic shock: Secondary | ICD-10-CM

## 2020-03-26 DIAGNOSIS — N170 Acute kidney failure with tubular necrosis: Secondary | ICD-10-CM | POA: Diagnosis not present

## 2020-03-26 DIAGNOSIS — U071 COVID-19: Secondary | ICD-10-CM

## 2020-03-26 DIAGNOSIS — I69354 Hemiplegia and hemiparesis following cerebral infarction affecting left non-dominant side: Secondary | ICD-10-CM

## 2020-03-26 DIAGNOSIS — J80 Acute respiratory distress syndrome: Secondary | ICD-10-CM | POA: Diagnosis not present

## 2020-03-26 DIAGNOSIS — E860 Dehydration: Secondary | ICD-10-CM | POA: Diagnosis not present

## 2020-03-26 DIAGNOSIS — I959 Hypotension, unspecified: Secondary | ICD-10-CM | POA: Diagnosis not present

## 2020-03-26 DIAGNOSIS — R509 Fever, unspecified: Secondary | ICD-10-CM | POA: Diagnosis not present

## 2020-03-26 DIAGNOSIS — R0902 Hypoxemia: Secondary | ICD-10-CM | POA: Diagnosis not present

## 2020-03-26 DIAGNOSIS — R1900 Intra-abdominal and pelvic swelling, mass and lump, unspecified site: Secondary | ICD-10-CM | POA: Diagnosis not present

## 2020-03-26 DIAGNOSIS — Z880 Allergy status to penicillin: Secondary | ICD-10-CM

## 2020-03-26 DIAGNOSIS — R918 Other nonspecific abnormal finding of lung field: Secondary | ICD-10-CM | POA: Diagnosis not present

## 2020-03-26 DIAGNOSIS — D649 Anemia, unspecified: Secondary | ICD-10-CM | POA: Diagnosis not present

## 2020-03-26 DIAGNOSIS — I110 Hypertensive heart disease with heart failure: Secondary | ICD-10-CM | POA: Diagnosis present

## 2020-03-26 DIAGNOSIS — R410 Disorientation, unspecified: Secondary | ICD-10-CM | POA: Diagnosis not present

## 2020-03-26 DIAGNOSIS — Z79899 Other long term (current) drug therapy: Secondary | ICD-10-CM

## 2020-03-26 DIAGNOSIS — N179 Acute kidney failure, unspecified: Secondary | ICD-10-CM | POA: Diagnosis not present

## 2020-03-26 DIAGNOSIS — N920 Excessive and frequent menstruation with regular cycle: Secondary | ICD-10-CM | POA: Diagnosis present

## 2020-03-26 DIAGNOSIS — D631 Anemia in chronic kidney disease: Secondary | ICD-10-CM | POA: Diagnosis not present

## 2020-03-26 DIAGNOSIS — J1289 Other viral pneumonia: Secondary | ICD-10-CM | POA: Diagnosis not present

## 2020-03-26 HISTORY — DX: Unspecified kidney failure: N19

## 2020-03-26 LAB — URINE DRUG SCREEN, QUALITATIVE (ARMC ONLY)
Amphetamines, Ur Screen: NOT DETECTED
Barbiturates, Ur Screen: NOT DETECTED
Benzodiazepine, Ur Scrn: NOT DETECTED
Cannabinoid 50 Ng, Ur ~~LOC~~: NOT DETECTED
Cocaine Metabolite,Ur ~~LOC~~: NOT DETECTED
MDMA (Ecstasy)Ur Screen: NOT DETECTED
Methadone Scn, Ur: NOT DETECTED
Opiate, Ur Screen: NOT DETECTED
Phencyclidine (PCP) Ur S: NOT DETECTED
Tricyclic, Ur Screen: POSITIVE — AB

## 2020-03-26 LAB — PROCALCITONIN: Procalcitonin: 7.6 ng/mL

## 2020-03-26 LAB — IRON AND TIBC
Iron: 8 ug/dL — ABNORMAL LOW (ref 28–170)
Saturation Ratios: 2 % — ABNORMAL LOW (ref 10.4–31.8)
TIBC: 343 ug/dL (ref 250–450)
UIBC: 335 ug/dL

## 2020-03-26 LAB — URINALYSIS, COMPLETE (UACMP) WITH MICROSCOPIC
Bilirubin Urine: NEGATIVE
Glucose, UA: NEGATIVE mg/dL
Ketones, ur: NEGATIVE mg/dL
Nitrite: NEGATIVE
Protein, ur: >=300 mg/dL — AB
RBC / HPF: >50 RBC/hpf — ABNORMAL HIGH (ref 0–5)
Specific Gravity, Urine: 1.026 (ref 1.005–1.030)
WBC, UA: >50 WBC/hpf — ABNORMAL HIGH (ref 0–5)
pH: 5 (ref 5.0–8.0)

## 2020-03-26 LAB — COMPREHENSIVE METABOLIC PANEL
ALT: 27 U/L (ref 0–44)
ALT: 28 U/L (ref 0–44)
AST: 84 U/L — ABNORMAL HIGH (ref 15–41)
AST: 86 U/L — ABNORMAL HIGH (ref 15–41)
Albumin: 3.1 g/dL — ABNORMAL LOW (ref 3.5–5.0)
Albumin: 2.8 g/dL — ABNORMAL LOW (ref 3.5–5.0)
Alkaline Phosphatase: 57 U/L (ref 38–126)
Alkaline Phosphatase: 56 U/L (ref 38–126)
Anion gap: 14 (ref 5–15)
Anion gap: 13 (ref 5–15)
BUN: 71 mg/dL — ABNORMAL HIGH (ref 6–20)
BUN: 69 mg/dL — ABNORMAL HIGH (ref 6–20)
CO2: 16 mmol/L — ABNORMAL LOW (ref 22–32)
CO2: 14 mmol/L — ABNORMAL LOW (ref 22–32)
Calcium: 8.2 mg/dL — ABNORMAL LOW (ref 8.9–10.3)
Calcium: 7.6 mg/dL — ABNORMAL LOW (ref 8.9–10.3)
Chloride: 110 mmol/L (ref 98–111)
Chloride: 113 mmol/L — ABNORMAL HIGH (ref 98–111)
Creatinine, Ser: 7.3 mg/dL — ABNORMAL HIGH (ref 0.44–1.00)
Creatinine, Ser: 7.1 mg/dL — ABNORMAL HIGH (ref 0.44–1.00)
GFR calc Af Amer: 7 mL/min — ABNORMAL LOW (ref >60)
GFR calc Af Amer: 7 mL/min — ABNORMAL LOW (ref >60)
GFR calc non Af Amer: 6 mL/min — ABNORMAL LOW (ref >60)
GFR calc non Af Amer: 6 mL/min — ABNORMAL LOW (ref >60)
Glucose, Bld: 112 mg/dL — ABNORMAL HIGH (ref 70–99)
Glucose, Bld: 105 mg/dL — ABNORMAL HIGH (ref 70–99)
Potassium: 3.7 mmol/L (ref 3.5–5.1)
Potassium: 3.8 mmol/L (ref 3.5–5.1)
Sodium: 140 mmol/L (ref 135–145)
Sodium: 140 mmol/L (ref 135–145)
Total Bilirubin: 0.4 mg/dL (ref 0.3–1.2)
Total Bilirubin: 0.5 mg/dL (ref 0.3–1.2)
Total Protein: 7.4 g/dL (ref 6.5–8.1)
Total Protein: 6.8 g/dL (ref 6.5–8.1)

## 2020-03-26 LAB — CBC WITH DIFFERENTIAL/PLATELET
Abs Immature Granulocytes: 0.03 10*3/uL (ref 0.00–0.07)
Basophils Absolute: 0 10*3/uL (ref 0.0–0.1)
Basophils Relative: 0 %
Eosinophils Absolute: 0 10*3/uL (ref 0.0–0.5)
Eosinophils Relative: 0 %
HCT: 23 % — ABNORMAL LOW (ref 36.0–46.0)
Hemoglobin: 6.9 g/dL — ABNORMAL LOW (ref 12.0–15.0)
Immature Granulocytes: 1 %
Lymphocytes Relative: 18 %
Lymphs Abs: 1.1 10*3/uL (ref 0.7–4.0)
MCH: 19.5 pg — ABNORMAL LOW (ref 26.0–34.0)
MCHC: 30 g/dL (ref 30.0–36.0)
MCV: 65 fL — ABNORMAL LOW (ref 80.0–100.0)
Monocytes Absolute: 0.4 10*3/uL (ref 0.1–1.0)
Monocytes Relative: 7 %
Neutro Abs: 4.6 10*3/uL (ref 1.7–7.7)
Neutrophils Relative %: 74 %
Platelets: 473 10*3/uL — ABNORMAL HIGH (ref 150–400)
RBC: 3.54 MIL/uL — ABNORMAL LOW (ref 3.87–5.11)
RDW: 21.1 % — ABNORMAL HIGH (ref 11.5–15.5)
Smear Review: NORMAL
WBC: 6.2 10*3/uL (ref 4.0–10.5)
nRBC: 0 % (ref 0.0–0.2)

## 2020-03-26 LAB — LACTATE DEHYDROGENASE: LDH: 490 U/L — ABNORMAL HIGH (ref 98–192)

## 2020-03-26 LAB — BLOOD GAS, ARTERIAL
Acid-base deficit: 11.6 mmol/L — ABNORMAL HIGH (ref 0.0–2.0)
Bicarbonate: 13.5 mmol/L — ABNORMAL LOW (ref 20.0–28.0)
FIO2: 0.32
O2 Saturation: 91.8 %
Patient temperature: 37
pCO2 arterial: 28 mmHg — ABNORMAL LOW (ref 32.0–48.0)
pH, Arterial: 7.29 — ABNORMAL LOW (ref 7.350–7.450)
pO2, Arterial: 71 mmHg — ABNORMAL LOW (ref 83.0–108.0)

## 2020-03-26 LAB — SARS CORONAVIRUS 2 BY RT PCR (HOSPITAL ORDER, PERFORMED IN ~~LOC~~ HOSPITAL LAB): SARS Coronavirus 2: POSITIVE — AB

## 2020-03-26 LAB — GLUCOSE, CAPILLARY: Glucose-Capillary: 94 mg/dL (ref 70–99)

## 2020-03-26 LAB — PROTIME-INR
INR: 1.2 (ref 0.8–1.2)
Prothrombin Time: 14.5 seconds (ref 11.4–15.2)

## 2020-03-26 LAB — PATHOLOGIST SMEAR REVIEW

## 2020-03-26 LAB — FIBRINOGEN: Fibrinogen: 575 mg/dL — ABNORMAL HIGH (ref 210–475)

## 2020-03-26 LAB — RETICULOCYTES
Immature Retic Fract: 20.3 % — ABNORMAL HIGH (ref 2.3–15.9)
RBC.: 3.61 MIL/uL — ABNORMAL LOW (ref 3.87–5.11)
Retic Count, Absolute: 27.4 10*3/uL (ref 19.0–186.0)
Retic Ct Pct: 0.8 % (ref 0.4–3.1)

## 2020-03-26 LAB — LACTIC ACID, PLASMA
Lactic Acid, Venous: 1.1 mmol/L (ref 0.5–1.9)
Lactic Acid, Venous: 0.7 mmol/L (ref 0.5–1.9)

## 2020-03-26 LAB — SAMPLE TO BLOOD BANK

## 2020-03-26 LAB — FOLATE: Folate: 13.5 ng/mL (ref >5.9)

## 2020-03-26 LAB — VITAMIN B12: Vitamin B-12: 804 pg/mL (ref 180–914)

## 2020-03-26 LAB — MRSA PCR SCREENING: MRSA by PCR: NEGATIVE

## 2020-03-26 LAB — PREGNANCY, URINE: Preg Test, Ur: NEGATIVE

## 2020-03-26 LAB — BILIRUBIN, DIRECT: Bilirubin, Direct: <0.1 mg/dL (ref 0.0–0.2)

## 2020-03-26 LAB — FIBRIN DERIVATIVES D-DIMER (ARMC ONLY): Fibrin derivatives D-dimer (ARMC): 1839.18 ng/mL (FEU) — ABNORMAL HIGH (ref 0.00–499.00)

## 2020-03-26 LAB — FERRITIN: Ferritin: 48 ng/mL (ref 11–307)

## 2020-03-26 LAB — C-REACTIVE PROTEIN: CRP: 9.1 mg/dL — ABNORMAL HIGH (ref <1.0)

## 2020-03-26 LAB — POC SARS CORONAVIRUS 2 AG: SARS Coronavirus 2 Ag: NEGATIVE

## 2020-03-26 LAB — ABO/RH: ABO/RH(D): B POS

## 2020-03-26 MED ORDER — MELATONIN 5 MG PO TABS
10.0000 mg | ORAL_TABLET | Freq: Every day | ORAL | Status: DC
  Administered 2020-03-27 – 2020-03-29 (×3): 10 mg via ORAL
  Filled 2020-03-26 (×3): qty 2

## 2020-03-26 MED ORDER — SODIUM CHLORIDE 0.9 % IV SOLN
200.0000 mg | Freq: Once | INTRAVENOUS | Status: DC
  Filled 2020-03-26: qty 40

## 2020-03-26 MED ORDER — DEXAMETHASONE SODIUM PHOSPHATE 10 MG/ML IJ SOLN
6.0000 mg | Freq: Every day | INTRAMUSCULAR | Status: DC
  Administered 2020-03-26 – 2020-04-01 (×7): 6 mg via INTRAVENOUS
  Filled 2020-03-26 (×7): qty 0.6

## 2020-03-26 MED ORDER — ALBUTEROL SULFATE HFA 108 (90 BASE) MCG/ACT IN AERS
1.0000 | INHALATION_SPRAY | RESPIRATORY_TRACT | Status: DC
  Administered 2020-03-26 – 2020-04-16 (×115): 1 via RESPIRATORY_TRACT
  Filled 2020-03-26: qty 6.7

## 2020-03-26 MED ORDER — FAMOTIDINE IN NACL 20-0.9 MG/50ML-% IV SOLN: 20.0000 mg | Freq: Two times a day (BID) | INTRAVENOUS | Status: DC

## 2020-03-26 MED ORDER — SODIUM CHLORIDE 0.9 % IV BOLUS (SEPSIS)
1000.0000 mL | Freq: Once | INTRAVENOUS | Status: AC
  Administered 2020-03-26: 1000 mL via INTRAVENOUS

## 2020-03-26 MED ORDER — PANTOPRAZOLE SODIUM 40 MG IV SOLR: 40.0000 mg | Freq: Every day | INTRAVENOUS | Status: DC

## 2020-03-26 MED ORDER — ACETAMINOPHEN 500 MG PO TABS
1000.0000 mg | ORAL_TABLET | Freq: Once | ORAL | Status: AC
  Administered 2020-03-26: 1000 mg via ORAL
  Filled 2020-03-26: qty 2

## 2020-03-26 MED ORDER — POLYETHYLENE GLYCOL 3350 17 G PO PACK
17.0000 g | PACK | Freq: Every day | ORAL | Status: DC | PRN
  Filled 2020-03-26: qty 1

## 2020-03-26 MED ORDER — PANTOPRAZOLE SODIUM 40 MG IV SOLR
40.0000 mg | Freq: Every day | INTRAVENOUS | Status: DC
  Administered 2020-03-26 – 2020-03-28 (×3): 40 mg via INTRAVENOUS
  Filled 2020-03-26 (×3): qty 40

## 2020-03-26 MED ORDER — SODIUM CHLORIDE 0.9 % IV SOLN
2.0000 g | INTRAVENOUS | Status: AC
  Administered 2020-03-27 – 2020-03-29 (×3): 2 g via INTRAVENOUS
  Filled 2020-03-26 (×3): qty 2

## 2020-03-26 MED ORDER — DEXAMETHASONE 4 MG PO TABS
6.0000 mg | ORAL_TABLET | ORAL | Status: DC
  Filled 2020-03-26: qty 1.5

## 2020-03-26 MED ORDER — BUPRENORPHINE HCL-NALOXONE HCL 8-2 MG SL SUBL
1.0000 | SUBLINGUAL_TABLET | Freq: Every day | SUBLINGUAL | Status: DC
  Administered 2020-03-28 – 2020-04-16 (×20): 1 via SUBLINGUAL
  Filled 2020-03-26 (×20): qty 1

## 2020-03-26 MED ORDER — IPRATROPIUM BROMIDE HFA 17 MCG/ACT IN AERS
2.0000 | INHALATION_SPRAY | RESPIRATORY_TRACT | Status: DC
  Administered 2020-03-27 – 2020-04-16 (×114): 2 via RESPIRATORY_TRACT
  Filled 2020-03-26 (×2): qty 12.9

## 2020-03-26 MED ORDER — STERILE WATER FOR INJECTION IV SOLN
INTRAVENOUS | Status: DC
  Filled 2020-03-26 (×13): qty 850

## 2020-03-26 MED ORDER — DOCUSATE SODIUM 100 MG PO CAPS
100.0000 mg | ORAL_CAPSULE | Freq: Two times a day (BID) | ORAL | Status: DC | PRN
  Administered 2020-04-05: 100 mg via ORAL
  Filled 2020-03-26 (×2): qty 1

## 2020-03-26 MED ORDER — SODIUM CHLORIDE 0.9 % IV SOLN
500.0000 mg | INTRAVENOUS | Status: DC
  Administered 2020-03-26: 500 mg via INTRAVENOUS
  Filled 2020-03-26 (×2): qty 500

## 2020-03-26 MED ORDER — ONDANSETRON HCL 4 MG/2ML IJ SOLN
4.0000 mg | Freq: Four times a day (QID) | INTRAMUSCULAR | Status: DC | PRN
  Administered 2020-04-01 – 2020-04-02 (×2): 4 mg via INTRAVENOUS
  Filled 2020-03-26 (×2): qty 2

## 2020-03-26 MED ORDER — AEROCHAMBER PLUS FLO-VU MISC
1.0000 | Freq: Once | Status: DC
  Filled 2020-03-26: qty 1

## 2020-03-26 MED ORDER — SODIUM CHLORIDE 0.9 % IV SOLN
500.0000 mg | INTRAVENOUS | Status: AC
  Administered 2020-03-27 – 2020-03-29 (×3): 500 mg via INTRAVENOUS
  Filled 2020-03-26 (×3): qty 500

## 2020-03-26 MED ORDER — SODIUM CHLORIDE 0.9% FLUSH: 3.0000 mL | INTRAVENOUS | Status: DC | PRN

## 2020-03-26 MED ORDER — ACETAMINOPHEN 325 MG PO TABS
650.0000 mg | ORAL_TABLET | ORAL | Status: DC | PRN
  Administered 2020-03-28 – 2020-04-10 (×4): 650 mg via ORAL
  Filled 2020-03-26 (×4): qty 2

## 2020-03-26 MED ORDER — SODIUM CHLORIDE 0.9 % IV SOLN: 250.0000 mL | INTRAVENOUS | Status: DC | PRN

## 2020-03-26 MED ORDER — ZINC SULFATE 220 (50 ZN) MG PO CAPS
220.0000 mg | ORAL_CAPSULE | Freq: Every day | ORAL | Status: DC
  Administered 2020-03-28 – 2020-03-31 (×4): 220 mg via ORAL
  Filled 2020-03-26 (×4): qty 1

## 2020-03-26 MED ORDER — SODIUM CHLORIDE 0.9 % IV SOLN
200.0000 mg | Freq: Once | INTRAVENOUS | Status: AC
  Administered 2020-03-26: 200 mg via INTRAVENOUS
  Filled 2020-03-26: qty 40

## 2020-03-26 MED ORDER — IPRATROPIUM-ALBUTEROL 20-100 MCG/ACT IN AERS
1.0000 | INHALATION_SPRAY | Freq: Four times a day (QID) | RESPIRATORY_TRACT | Status: DC
  Administered 2020-03-26: 1 via RESPIRATORY_TRACT
  Filled 2020-03-26: qty 4

## 2020-03-26 MED ORDER — HEPARIN SODIUM (PORCINE) 5000 UNIT/ML IJ SOLN
5000.0000 [IU] | Freq: Three times a day (TID) | INTRAMUSCULAR | Status: DC
  Administered 2020-03-26 – 2020-03-29 (×9): 5000 [IU] via SUBCUTANEOUS
  Filled 2020-03-26 (×9): qty 1

## 2020-03-26 MED ORDER — ASCORBIC ACID 500 MG PO TABS
500.0000 mg | ORAL_TABLET | Freq: Every day | ORAL | Status: DC
  Administered 2020-03-28 – 2020-03-30 (×3): 500 mg via ORAL
  Filled 2020-03-26 (×3): qty 1

## 2020-03-26 MED ORDER — SODIUM CHLORIDE 0.9% FLUSH
3.0000 mL | Freq: Two times a day (BID) | INTRAVENOUS | Status: DC
  Administered 2020-03-26 – 2020-04-16 (×33): 3 mL via INTRAVENOUS

## 2020-03-26 MED ORDER — SODIUM CHLORIDE 0.9 % IV SOLN
2.0000 g | INTRAVENOUS | Status: DC
  Administered 2020-03-26: 2 g via INTRAVENOUS
  Filled 2020-03-26 (×2): qty 20

## 2020-03-26 MED ORDER — SODIUM CHLORIDE 0.9 % IV SOLN
100.0000 mg | Freq: Every day | INTRAVENOUS | Status: AC
  Administered 2020-03-27 – 2020-03-30 (×4): 100 mg via INTRAVENOUS
  Filled 2020-03-26: qty 100
  Filled 2020-03-26 (×2): qty 20
  Filled 2020-03-26: qty 100

## 2020-03-26 MED ORDER — CICLESONIDE 160 MCG/ACT IN AERS
4.0000 | INHALATION_SPRAY | Freq: Two times a day (BID) | RESPIRATORY_TRACT | Status: DC
  Administered 2020-03-27 – 2020-04-11 (×31): 4 via RESPIRATORY_TRACT
  Filled 2020-03-26 (×4): qty 6.1

## 2020-03-26 MED ORDER — SODIUM CHLORIDE 0.9 % IV BOLUS (SEPSIS)
800.0000 mL | Freq: Once | INTRAVENOUS | Status: AC
  Administered 2020-03-26: 800 mL via INTRAVENOUS

## 2020-03-26 MED ORDER — SODIUM CHLORIDE 0.9 % IV SOLN
100.0000 mg | Freq: Every day | INTRAVENOUS | Status: DC
  Filled 2020-03-26: qty 20

## 2020-03-26 NOTE — Consult Note (Signed)
Diane Keller MRN: [id number] DOB/AGE: 46-Jul-1977 46 y.o. Primary Care Physician:Fisher, Kirstie Peri, MD Admit date: 03/26/2020 Chief Complaint:  Chief Complaint  Patient presents with  . Weakness  . Fever   HPI: Patient is a 44 year old female with a past medical history of GERD, CVA who came to the ER with chief complaint of fever and generalized weakness.  History of present illness dates back to 7 days ago when patient started having fever. Patient started feeling weak it was generalized. Patient also complained of malaise EMS was called and patient was brought to the ER.  Upon evaluation in the ER patient was found to be hypotensive and anemic and acute kidney injury and was Covid positive.Patient was admitted to ICU. Patient seen in the ICU Patient offers no complaint of chest pain No complaint of nausea vomiting or diarrhea No complaint of hematuria No history of over-the-counter NSAID use No complaint of change in speech or change in vision     Past Medical History:  Diagnosis Date  . Anxiety   . GERD (gastroesophageal reflux disease)   . Stroke [facility]) 2018        Family History  Problem Relation Age of Onset  . Hypertension Father   . Heart attack Father   . Cerebrovascular Accident Father     Social History:  reports that she has quit smoking. Her smoking use included cigarettes. She has never used smokeless tobacco. She reports current alcohol use. She reports that she does not use drugs.   Allergies:  Allergies  Allergen Reactions  . Augmentin [Amoxicillin-Pot Clavulanate] Itching  . Lisinopril Other (See Comments)    Cough    Medications Prior to Admission  Medication Sig Dispense Refill  . Buprenorphine HCl-Naloxone HCl (SUBOXONE) 8-2 MG FILM Place 2 Film under the tongue daily.    Marland Kitchen labetalol (NORMODYNE) 300 MG tablet TAKE 1 TABLET (300 MG TOTAL) BY MOUTH 2 (TWO) TIMES DAILY. 60 tablet 11  . omeprazole (PRILOSEC) 20 MG capsule TAKE 1 CAPSULE BY  MOUTH EVERY DAY (Patient taking differently: Take 20 mg by mouth daily. ) 30 capsule 6       VQQ:VZDGL from the symptoms mentioned above,there are no other symptoms referable to all systems reviewed.  . vitamin C  500 mg Oral Daily  . dexamethasone  6 mg Oral Q24H  . heparin  5,000 Units Subcutaneous Q8H  . Ipratropium-Albuterol  1 puff Inhalation Q6H  . pantoprazole (PROTONIX) IV  40 mg Intravenous Daily  . sodium chloride flush  3 mL Intravenous Q12H  . zinc sulfate  220 mg Oral Daily         OVF:IEPPI from the symptoms mentioned above,there are no other symptoms referable to all systems reviewed.  Physical Exam: Vital signs in last 24 hours: Temp:  [99.5 F (37.5 C)-101.4 F (38.6 C)] 99.5 F (37.5 C) (05/13 1645) Pulse Rate:  [76-85] 85 (05/13 1615) Resp:  [24-37] 24 (05/13 1615) BP: (102-125)/(61-91) 104/74 (05/13 1645) SpO2:  [36 %-100 %] 96 % (05/13 1645) Weight:  [116.3 kg] 116.3 kg (05/13 1244) Weight change:     Intake/Output from previous day: No intake/output data recorded. Total I/O In: 19 [Other:10; IV Piggyback:250] Out: 75 [Urine:75]   Physical Exam: General- pt is awake,alert, oriented to time place and person HEENT mucosa is dry Resp- No acute REsp distress, CTA B/L NO Rhonchi CVS- S1S2 regular ij rate and rhythm GIT- BS+, soft, NT, ND EXT- NO LE Edema, no cyanosis CNS- CN 2-12  grossly intact. Moving all 4 extremities Psych- normal mood and affect   Lab Results: CBC Recent Labs    03/26/20 1247  WBC 6.2  HGB 6.9*  HCT 23.0*  PLT 473*    BMET Recent Labs    03/26/20 1247 03/26/20 1659  NA 140 140  K 3.7 3.8  CL 110 113*  CO2 16* 14*  GLUCOSE 112* 105*  BUN 71* 69*  CREATININE 7.30* 7.10*  CALCIUM 8.2* 7.6*   Anion gap 140-127=13  Albumin 3.1 Delta anion gap 13-9=4   Delta bicarb 24-10=10   MICRO Recent Results (from the past 240 hour(s))  SARS Coronavirus 2 by RT PCR (hospital order, performed in [facility] hospital lab) Nasopharyngeal Nasopharyngeal Swab     Status: Abnormal   Collection Time: 03/26/20  2:47 PM   Specimen: Nasopharyngeal Swab  Result Value Ref Range Status   SARS Coronavirus 2 POSITIVE (A) NEGATIVE Final    Comment: RESULT CALLED TO, READ BACK BY AND VERIFIED WITH: CADY,R AT 1608 ON 03/26/2020 BY MOSLEY,J (NOTE) SARS-CoV-2 target nucleic acids are DETECTED SARS-CoV-2 RNA is generally detectable in upper respiratory specimens  during the acute phase of infection.  Positive results are indicative  of the presence of the identified virus, but do not rule out bacterial infection or co-infection with other pathogens not detected by the test.  Clinical correlation with patient history and  other diagnostic information is necessary to determine patient infection status.  The expected result is negative. Fact Sheet for Patients:   BoilerBrush.com.cy  Fact Sheet for Healthcare Providers:   https://pope.com/   This test is not yet approved or cleared by the [place] FDA and  has been authorized for detection and/or diagnosis of SARS-CoV-2 by FDA under an Emergency Use Authorization (EUA).  This EUA will remain in effect (meaning this test  can be used) for the duration of  the COVID-19 declaration under Section 564(b)(1) of the Act, 21 U.S.C. section 360-bbb-3(b)(1), unless the authorization is terminated or revoked sooner. Performed at [facility], [address] [place], [place] [address]       Lab Results  Component Value Date   CALCIUM 7.6 (L) 03/26/2020      Impression: 1)Renal  AKI secondary to ATN AKI secondary to Covid Covid associated GN?    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 Patient may need patient PRBC as hemoglobin is less than 7   4) Covid positive Patient is on Covid protocol   5)  electrolytes Normokalemic NOrmonatremic   6)Acid base Co2 not at goal  Patient has both anion gap and non-anion gap acidosis We will check patient's ABG today   Plan:   We will ask for complements We will ask for FENA We will ask for renal ultrasound We will ask for ABG Depending upon the ABG-pH we will decide if the patient needs CRRT tonight  Addendum Patient ABG showed pH was 7.3 PCO2 was 28 Patient is in respiratory alkalosis, metabolic non-anion gap acidosis We will start patient on IV bicarb   Raven Furnas s [facility] 03/26/2020, 5:37 PM

## 2020-03-26 NOTE — Progress Notes (Signed)
CODE SEPSIS - PHARMACY COMMUNICATION  **Broad Spectrum Antibiotics should be administered within 1 hour of Sepsis diagnosis**  Time Code Sepsis Called/Page Received: 1258  Antibiotics Ordered: CTX + azithromycin  Time of 1st antibiotic administration: 1332  Additional action taken by pharmacy: none needed  Tressie Ellis Pharmacy Resident 03/26/2020  1:15 PM

## 2020-03-26 NOTE — ED Notes (Signed)
Attempted to call report to ICU, was told unable to take report at this time, will call back in approx 20 mins as advised

## 2020-03-26 NOTE — Progress Notes (Signed)
Assisted tele visit to patient with husband.  Colinda Barth Samson, RN  

## 2020-03-26 NOTE — ED Triage Notes (Signed)
Pt arrived via [facility] from home with fever and weakness. Pt having dizziness, nausea, and hypotension as well. Hx of stroke 3 years ago with left side deficit.

## 2020-03-26 NOTE — Progress Notes (Signed)
Pharmacy Electrolyte Monitoring Consult:  Pharmacy consulted to assist in monitoring and replacing electrolytes in this 44 y.o. female admitted on 03/26/2020 with COVID-19 infection, pneumonia/pneumonitis.   Labs:  Sodium (mmol/L)  Date Value  03/26/2020 140  05/11/2012 140   Potassium (mmol/L)  Date Value  03/26/2020 3.8  05/11/2012 3.2 (L)   Calcium (mg/dL)  Date Value  71/29/2909 7.6 (L)   Calcium, Total (mg/dL)  Date Value  01/13/4995 9.2   Albumin (g/dL)  Date Value  92/49/3241 2.8 (L)  05/11/2012 3.7    Assessment/Plan: 1. Electrolytes: No replacement necessary. Will obtain follow labs in am.   2. Glucose: patient on dexamethasone 6mg  IV daily. Continue to monitor with daily BMP.   Pharmacy will continue to monitor and adjust per consult.    Diane Keller L 03/26/2020 7:33 PM

## 2020-03-26 NOTE — ED Provider Notes (Addendum)
[facility] Emergency Department Provider Note  ____________________________________________  Time seen: Approximately 2:51 PM  I have reviewed the triage vital signs and the nursing notes.   HISTORY  Chief Complaint Weakness and Fever    Level 5 Caveat: Portions of the History and Physical including HPI and review of systems are unable to be completely obtained due to acute critical illness  HPI Diane Keller is a 44 y.o. female with a history of GERD and prior stroke with residual left-sided motor deficit who is brought to the ED due to fever and generalized weakness.   EMS described the patient is having hypotension prior to arrival, but lowest blood pressure they observed was 95/70.  Patient is having lightheadedness nausea and malaise as well as shortness of breath and cough.     Past Medical History:  Diagnosis Date  . Anxiety   . GERD (gastroesophageal reflux disease)   . Stroke [facility]) 2018     Patient Active Problem List   Diagnosis Date Noted  . Renal failure 03/26/2020  . Essential hypertension 06/08/2017  . Heartburn 06/08/2017  . Hypertensive emergency 05/12/2017  . Morbid obesity (HCC) 05/12/2017  . 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 CESAREAN SECTION       Prior to Admission medications   Medication Sig Start Date End Date Taking? Authorizing Provider  Buprenorphine HCl-Naloxone HCl (SUBOXONE) 8-2 MG FILM Place 2 Film under the tongue daily.    [provider]  labetalol (NORMODYNE) 300 MG tablet TAKE 1 TABLET (300 MG TOTAL) BY MOUTH 2 (TWO) TIMES DAILY. 06/13/19   Malva Limes, MD  lisinopril (ZESTRIL) 20 MG tablet Take 1 tablet (20 mg total) by mouth daily. 04/09/19   Malva Limes, MD  omeprazole (PRILOSEC) 20 MG capsule TAKE 1 CAPSULE BY MOUTH EVERY DAY 12/10/19   Malva Limes, MD     Allergies Augmentin [amoxicillin-pot clavulanate]  and Lisinopril   Family History  Problem Relation Age of Onset  . Hypertension Father   . Heart attack Father   . Cerebrovascular Accident Father     Social History Social History   Tobacco Use  . Smoking status: Former Smoker    Types: Cigarettes  . Smokeless tobacco: Never Used  Substance Use Topics  . Alcohol use: Yes  . Drug use: No    Review of Systems Level 5 Caveat: Portions of the History and Physical including HPI and review of systems are unable to be completely obtained due to patient being a poor historian   Constitutional:   No known fever.  ENT:   No rhinorrhea. Cardiovascular:   No chest pain or syncope. Respiratory: Positive shortness of breath and cough. Gastrointestinal:   Negative for abdominal pain, vomiting and diarrhea.  Musculoskeletal:   Negative for focal pain or swelling ____________________________________________   PHYSICAL EXAM:  VITAL SIGNS: ED Triage Vitals  Enc Vitals Group     BP 03/26/20 1247 112/66     Pulse Rate 03/26/20 1247 79     Resp 03/26/20 1247 (!) 37     Temp 03/26/20 1249 (!) 101.2 F (38.4 C)     Temp Source 03/26/20 1249 Oral     SpO2 03/26/20 1247 100 %     Weight 03/26/20 1244 256 lb 6.3 oz (116.3 kg)     Height 03/26/20 1244 5\' 6"  (1.676 m)     Head Circumference --  Peak Flow --      Pain Score 03/26/20 1244 0     Pain Loc --      Pain Edu? --      Excl. in GC? --     Vital signs reviewed, nursing assessments reviewed.   Constitutional:   Alert and oriented.  Ill-appearing Eyes:   Conjunctivae are normal. EOMI. PERRL. ENT      Head:   Normocephalic and atraumatic.      Nose:   No congestion/rhinnorhea.       Mouth/Throat:   Dry mucous membranes, no pharyngeal erythema. No peritonsillar mass.       Neck:   No meningismus. Full ROM. Hematological/Lymphatic/Immunilogical:   No cervical lymphadenopathy. Cardiovascular:   RRR. Symmetric bilateral radial and DP pulses.  No murmurs. Cap refill less than  2 seconds. Respiratory: Tachypnea.  Bilateral crackles, right greater than left Gastrointestinal:   Soft and nontender. Non distended.  There is a palpable suprapubic mass, possibly scar tissue buildup related to prior low abdominal midline incision.  No rebound, rigidity, or guarding. Musculoskeletal:   Normal range of motion in all extremities. No joint effusions.  No lower extremity tenderness.  No edema. Neurologic:   Normal speech and language.  Motor grossly intact. No acute focal neurologic deficits are appreciated.  Skin:    Skin is warm, dry and intact. No rash noted.  No petechiae, purpura, or bullae.  ____________________________________________    LABS (pertinent positives/negatives) (all labs ordered are listed, but only abnormal results are displayed) Labs Reviewed  COMPREHENSIVE METABOLIC PANEL - Abnormal; Notable for the following components:      Result Value   CO2 16 (*)    Glucose, Bld 112 (*)    BUN 71 (*)    Creatinine, Ser 7.30 (*)    Calcium 8.2 (*)    Albumin 3.1 (*)    AST 84 (*)    GFR calc non Af Amer 6 (*)    GFR calc Af Amer 7 (*)    All other components within normal limits  CBC WITH DIFFERENTIAL/PLATELET - Abnormal; Notable for the following components:   RBC 3.54 (*)    Hemoglobin 6.9 (*)    HCT 23.0 (*)    MCV 65.0 (*)    MCH 19.5 (*)    RDW 21.1 (*)    Platelets 473 (*)    All other components within normal limits  URINALYSIS, COMPLETE (UACMP) WITH MICROSCOPIC - Abnormal; Notable for the following components:   Color, Urine AMBER (*)    APPearance TURBID (*)    Hgb urine dipstick MODERATE (*)    Protein, ur >=300 (*)    Leukocytes,Ua LARGE (*)    RBC / HPF >50 (*)    WBC, UA >50 (*)    Bacteria, UA MANY (*)    All other components within normal limits  RETICULOCYTES - Abnormal; Notable for the following components:   RBC. 3.61 (*)    Immature Retic Fract 20.3 (*)    All other components within normal limits  CULTURE, BLOOD (ROUTINE  X 2)  CULTURE, BLOOD (ROUTINE X 2)  SARS CORONAVIRUS 2 BY RT PCR (HOSPITAL ORDER, PERFORMED IN [facility])  LACTIC ACID, PLASMA  PROTIME-INR  PROCALCITONIN  LACTIC ACID, PLASMA  PATHOLOGIST SMEAR REVIEW  VITAMIN B12  FOLATE  IRON AND TIBC  FERRITIN  HAPTOGLOBIN  LACTATE DEHYDROGENASE  BILIRUBIN, DIRECT  FIBRINOGEN  FIBRIN DERIVATIVES D-DIMER ([facility] ONLY)  HIV ANTIBODY (ROUTINE TESTING W  REFLEX)  CBC  CREATININE, SERUM  POC URINE PREG, ED  POC SARS CORONAVIRUS 2 AG -  ED   ____________________________________________   EKG  Interpreted by me Sinus rhythm rate of 79, normal axis and intervals.  Normal QRS ST segments and T waves.  ____________________________________________    RADIOLOGY  DG Abdomen 1 View  Result Date: 03/26/2020 CLINICAL DATA:  Shortness of breath.  Abdominal mass. EXAM: ABDOMEN - 1 VIEW COMPARISON:  None FINDINGS: Normal amount of gas within the intestine. No definite soft tissue mass by radiography. Some potential for increased density projected over the left pelvis, not a reliable finding. No abnormal calcifications or significant bone findings. IMPRESSION: Gas pattern within normal limits. Possible increased density of the left pelvis that could represent a mass. Electronically Signed   By: Nelson Chimes M.D.   On: 03/26/2020 13:44   DG Chest Portable 1 View  Result Date: 03/26/2020 CLINICAL DATA:  Shortness of breath and hypoxia. Abdominal mass. Fever and weakness. EXAM: PORTABLE CHEST 1 VIEW COMPARISON:  04/23/2012 FINDINGS: Cardiomegaly. Mediastinal shadows are normal. Areas of patchy bilateral pulmonary density that could represent patchy bronchopneumonia. An element of edema is possible. No visible effusion. No significant bone finding. IMPRESSION: Patchy bilateral pulmonary densities most consistent with pneumonia. Possible that there could be an element of edema. Electronically Signed   By: Nelson Chimes M.D.   On: 03/26/2020 13:43     ____________________________________________   PROCEDURES .Critical Care Performed by: Carrie Mew, MD Authorized by: Carrie Mew, MD   Critical care provider statement:    Critical care time (minutes):  35   Critical care time was exclusive of:  Separately billable procedures and treating other patients   Critical care was necessary to treat or prevent imminent or life-threatening deterioration of the following conditions:  Sepsis and respiratory failure   Critical care was time spent personally by me on the following activities:  Development of treatment plan with patient or surrogate, discussions with consultants, evaluation of patient's response to treatment, examination of patient, obtaining history from patient or surrogate, ordering and performing treatments and interventions, ordering and review of laboratory studies, ordering and review of radiographic studies, pulse oximetry, re-evaluation of patient's condition and review of old charts    ____________________________________________  DIFFERENTIAL DIAGNOSIS   Pneumonia, sepsis, Covid, UTI, dehydration, electrolyte abnormality  CLINICAL IMPRESSION / ASSESSMENT AND PLAN / ED COURSE  Medications ordered in the ED: Medications  cefTRIAXone (ROCEPHIN) 2 g in sodium chloride 0.9 % 100 mL IVPB (0 g Intravenous Stopped 03/26/20 1407)  azithromycin (ZITHROMAX) 500 mg in sodium chloride 0.9 % 250 mL IVPB (500 mg Intravenous New Bag/Given 03/26/20 1408)  sodium chloride flush (NS) 0.9 % injection 3 mL (has no administration in time range)  sodium chloride flush (NS) 0.9 % injection 3 mL (has no administration in time range)  0.9 %  sodium chloride infusion (has no administration in time range)  acetaminophen (TYLENOL) tablet 650 mg (has no administration in time range)  docusate sodium (COLACE) capsule 100 mg (has no administration in time range)  polyethylene glycol (MIRALAX / GLYCOLAX) packet 17 g (has no  administration in time range)  ondansetron (ZOFRAN) injection 4 mg (has no administration in time range)  famotidine (PEPCID) IVPB 20 mg premix (has no administration in time range)  heparin injection 5,000 Units (has no administration in time range)  sodium chloride 0.9 % bolus 1,000 mL (0 mLs Intravenous Stopped 03/26/20 1356)    And  sodium chloride  0.9 % bolus 800 mL (0 mLs Intravenous Stopped 03/26/20 1356)  acetaminophen (TYLENOL) tablet 1,000 mg (1,000 mg Oral Given 03/26/20 1437)    Pertinent labs & imaging results that were available during my care of the patient were reviewed by me and considered in my medical decision making (see chart for details).   Diane Keller was evaluated in Emergency Department on 03/26/2020 for the symptoms described in the history of present illness. She was evaluated in the context of the global COVID-19 pandemic, which necessitated consideration that the patient might be at risk for infection with the SARS-CoV-2 virus that causes COVID-19. Institutional protocols and algorithms that pertain to the evaluation of patients at risk for COVID-19 are in a state of rapid change based on information released by regulatory bodies including the CDC and federal and state organizations. These policies and algorithms were followed during the patient's care in the ED.     Clinical Course as of Mar 26 1454  Thu Mar 26, 2020  1254 Patient presents with fever tachypnea hypoxia, exam concerning for pneumonia and sepsis.  Continue nonrebreather at 10 L for now, wean as tolerated to maintain sat above 90 to 94%.  Code sepsis initiated, will start ceftriaxone and azithromycin.  EMS report lowest blood pressure was 95/70.  No hypotension, clinically no distributive shock.  Will follow up lactate.  Patient is morbidly obese with a BMI of 41.  Will use ideal body weight for fluid bolus calculation for sepsis resulting in a total fluid bolus volume of 1800 mL for 30ml/kg IBW.    [PS]  1334 Labs show acute renal failure with a creatinine of 7.  Lactate is normal.  Patient has anemia with high RDW and schistocytes on smear concerning for hemolysis.   [PS]    Clinical Course User Index [PS] Sharman Cheek, MD    ----------------------------------------- 2:55 PM on 03/26/2020 -----------------------------------------  Discussed with ICU, Dr. Belia Heman will plan to admit.  Urinalysis also consistent with UTI.  Initial urine output with Foley catheter insertion less than 100 mL.   ____________________________________________   FINAL CLINICAL IMPRESSION(S) / ED DIAGNOSES    Final diagnoses:  Acute respiratory failure with hypoxia (HCC)  Severe sepsis (HCC)  Acute anemia  Acute renal failure, unspecified acute renal failure type [facility])     ED Discharge Orders    None      Portions of this note were generated with dragon dictation software. Dictation errors may occur despite best attempts at proofreading.     Sharman Cheek, MD 03/26/20 [id number]

## 2020-03-26 NOTE — Consult Note (Signed)
[facility]  Date of admission:  03/26/2020  Inpatient day:  03/26/2020  Consulting physician:  Dr Erin Fulling.  Reason for Consultation:  Anemia and schistocytes.  Chief Complaint: Diane Keller is a 44 y.o. female with COVID-19 who was admitted through the emergency room with sepsis, acute renal failure, bilateral pneumonia and a urinary tract infection.  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  She was in her usual state of health until 03/20/2020.  She describes feeling fatigued and not feeling well. She has had a decreased appetite and upset stomach.  She denied any diarrhea.  She denied any fever, chills, sweats or weight loss.  She denied any bruising or bleeding.  She recently completed a heavy menstrual period.  She notes very little oral intake and voiding only 1-2 times a day.  She feels dehydrated.   She denied any new medications or herbal products.  She denies any drug use.  She presented to the ER via EMS with fever (101.2) and hypotension (95/70).  She was initially on a non-rebreather at 10 L then weaned down to oxygen via [place].  A code sepsis was called.  She received IVF and antibiotics.  Foley catheter placed with < 100 cc.  Labs revealed + COVID-19.  CBC revealed a hematocrit of 23.0, hemoglobin 6.9, MCV 65.0, platelets 473,000, WBC 6200 with an ANC of 4600.  PT was 14.5 with an INR of 1.2.  Fibrinogen was 575 (210-475).  D-dimers were 1839.18 (0-499).  Ferritin was 48 and iron saturation 2% and TIBC 343.  Retic was 0.8% (low).  B12 was 804 and folate 13.5.  LDH was 490 (98-192).  Comprehensive metabolic panel revealed a bicarb 14, BUN 69, creatinine 7.10, calcium 8.2 (corrected 8.965), protein 7.4., albumen 2.8, AST 86, and bilirubin 0.5.  Procalcitonin was 7.60.  Lactic acid was 1.1.  Urinalysis revealed moderate hemoglobin, > 300 mg/dL protein, bilirubin negative, large leukocytes, >  50 RBC, > 50 WBC, and many bacteria.  Urine drug screen was + tricyclics.  Pregnancy test was negative.  Pending labs include haptoglobin and HIV testing.  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  There were no nucleated RBCs.  WBCs noted early reactive changes.  There was thrombocytosis with variation in platelet size.  Labs from 05/09/2017 revealed a hematocrit 31.7, hemoglobin 10.9, and MCV 86.0.  Creatinine was 0.87.  CXR noted patchy bilateral densities c/w pneumonia.  KUB revealed a normal gas pattern with possible increased density in the left pelvis that could represent a mass.   Past Medical History:  Diagnosis Date  . Anxiety   . GERD (gastroesophageal reflux disease)   . Stroke [facility]) 2018    Past Surgical History:  Procedure Laterality Date  . CESAREAN SECTION      Family History  Problem Relation Age of Onset  . Hypertension Father   . Heart attack Father   . Cerebrovascular Accident Father     Social History:  reports that she has quit smoking. Her smoking use included cigarettes. She has never used smokeless tobacco. She reports current alcohol use. She reports that she does not use drugs.  The patient denies any exposure to radiation or toxins.  The patient lives in [place].  She is alone today.  Allergies:  Allergies  Allergen Reactions  . Augmentin [Amoxicillin-Pot Clavulanate] Itching  . Lisinopril Other (See Comments)    Cough  Medications Prior to Admission  Medication Sig Dispense Refill  . Buprenorphine HCl-Naloxone HCl (SUBOXONE) 8-2 MG FILM Place 2 Film under the tongue daily.    Marland Kitchen labetalol (NORMODYNE) 300 MG tablet TAKE 1 TABLET (300 MG TOTAL) BY MOUTH 2 (TWO) TIMES DAILY. 60 tablet 11  . omeprazole (PRILOSEC) 20 MG capsule TAKE 1 CAPSULE BY MOUTH EVERY DAY (Patient taking differently: Take 20 mg by mouth daily. ) 30 capsule 6    Review of Systems: GENERAL:  Fatigue.  Fever on presentation to the ER.  No sweats or  weight loss. PERFORMANCE STATUS (ECOG):  2 HEENT:  No visual changes, runny nose, sore throat, mouth sores or tenderness. Lungs: No shortness of breath or cough.  No hemoptysis. Cardiac:  No chest pain, palpitations, orthopnea, or PND. GI:  Decreased oral intake.  Nausea.  No diarrhea, constipation, melena or hematochezia. GU:  Decreased fluid intake and decreased urine output.  No urgency, frequency, dysuria, or hematuria. Musculoskeletal:  No back pain.  No joint pain.  No muscle tenderness. Extremities:  No pain or swelling. Skin:  No rashes or skin changes. Neuro:  Left sided weakness (old).  No headache, numbness or new weakness, balance or coordination issues. Endocrine:  No diabetes, thyroid issues, hot flashes or night sweats. Psych:  No mood changes, depression or anxiety. Pain:  No focal pain. Review of systems:  All other systems reviewed and found to be negative.  Physical Exam:  Blood pressure 104/74, pulse 85, temperature 99.5 F (37.5 C), resp. rate (!) 24, height 5\' 6"  (1.676 m), weight 256 lb 6.3 oz (116.3 kg), SpO2 96 %.  GENERAL:  Fatigued appearing woman lying comfortably in the medical ICU in no acute distress. MENTAL STATUS:  Alert and oriented to person and place. HEAD:  [place] hair.  Normocephalic, atraumatic, face symmetric, no Cushingoid features. EYES:  Huertas eyes.  Pupils equal round and reactive to light and accomodation.  No conjunctivitis or scleral icterus. ENT:  [place] in place.  Oropharynx clear without lesion.  Tongue normal. Mucous membranes moist.  RESPIRATORY:  Bilateral coarse rhonchi.  No wheezes. CARDIOVASCULAR:  Regular rate and rhythm without murmur, rub or gallop. ABDOMEN:  Soft, non-tender, with active bowel sounds, and no appreciable hepatosplenomegaly.  No masses. SKIN:  No rashes, ulcers or lesions. EXTREMITIES: No edema, no skin discoloration or tenderness.  No palpable cords. LYMPH NODES: No palpable cervical, supraclavicular, axillary or  inguinal adenopathy  NEUROLOGICAL: Unremarkable. Interactive. PSYCH:  Appropriate.   Results for orders placed or performed during the hospital encounter of 03/26/20 (from the past 48 hour(s))  Procalcitonin     Status: None   Collection Time: 03/26/20 12:40 PM  Result Value Ref Range   Procalcitonin 7.60 ng/mL    Comment:        Interpretation: PCT > 2 ng/mL: Systemic infection (sepsis) is likely, unless other causes are known. (NOTE)       Sepsis PCT Algorithm           Lower Respiratory Tract                                      Infection PCT Algorithm    ----------------------------     ----------------------------         PCT < 0.25 ng/mL                PCT < 0.10 ng/mL  Strongly encourage             Strongly discourage   discontinuation of antibiotics    initiation of antibiotics    ----------------------------     -----------------------------       PCT 0.25 - 0.50 ng/mL            PCT 0.10 - 0.25 ng/mL               OR       >80% decrease in PCT            Discourage initiation of                                            antibiotics      Encourage discontinuation           of antibiotics    ----------------------------     -----------------------------         PCT >= 0.50 ng/mL              PCT 0.26 - 0.50 ng/mL               AND       <80% decrease in PCT              Encourage initiation of                                             antibiotics       Encourage continuation           of antibiotics    ----------------------------     -----------------------------        PCT >= 0.50 ng/mL                  PCT > 0.50 ng/mL               AND         increase in PCT                  Strongly encourage                                      initiation of antibiotics    Strongly encourage escalation           of antibiotics                                     -----------------------------                                           PCT <= 0.25 ng/mL                                                  OR                                        >  80% decrease in PCT                                     Discontinue / Do not initiate                                             antibiotics Performed at [facility], [address] [place], [place] [address]   Comprehensive metabolic panel     Status: Abnormal   Collection Time: 03/26/20 12:47 PM  Result Value Ref Range   Sodium 140 135 - 145 mmol/L   Potassium 3.7 3.5 - 5.1 mmol/L   Chloride 110 98 - 111 mmol/L   CO2 16 (L) 22 - 32 mmol/L   Glucose, Bld 112 (H) 70 - 99 mg/dL    Comment: Glucose reference range applies only to samples taken after fasting for at least 8 hours.   BUN 71 (H) 6 - 20 mg/dL   Creatinine, Ser 6.04 (H) 0.44 - 1.00 mg/dL   Calcium 8.2 (L) 8.9 - 10.3 mg/dL   Total Protein 7.4 6.5 - 8.1 g/dL   Albumin 3.1 (L) 3.5 - 5.0 g/dL   AST 84 (H) 15 - 41 U/L   ALT 27 0 - 44 U/L   Alkaline Phosphatase 57 38 - 126 U/L   Total Bilirubin 0.4 0.3 - 1.2 mg/dL   GFR calc non Af Amer 6 (L) >60 mL/min   GFR calc Af Amer 7 (L) >60 mL/min   Anion gap 14 5 - 15    Comment: Performed at [facility], [address] [place], [place] [address]  Lactic acid, plasma     Status: None   Collection Time: 03/26/20 12:47 PM  Result Value Ref Range   Lactic Acid, Venous 1.1 0.5 - 1.9 mmol/L    Comment: Performed at [facility], [address] [place], [place] [address]  CBC with Differential     Status: Abnormal   Collection Time: 03/26/20 12:47 PM  Result Value Ref Range   WBC 6.2 4.0 - 10.5 K/uL   RBC 3.54 (L) 3.87 - 5.11 MIL/uL   Hemoglobin 6.9 (L) 12.0 - 15.0 g/dL    Comment: Reticulocyte Hemoglobin testing may be clinically indicated, consider ordering this additional test [id number]    HCT 23.0 (L) 36.0 - 46.0 %   MCV 65.0 (L) 80.0 - 100.0 fL   MCH 19.5 (L) 26.0 - 34.0 pg   MCHC 30.0 30.0 - 36.0 g/dL   RDW 30.8 (H) 65.7 - 84.6 %   Platelets 473 (H) 150 - 400  K/uL   nRBC 0.0 0.0 - 0.2 %   Neutrophils Relative % 74 %   Neutro Abs 4.6 1.7 - 7.7 K/uL   Lymphocytes Relative 18 %   Lymphs Abs 1.1 0.7 - 4.0 K/uL   Monocytes Relative 7 %   Monocytes Absolute 0.4 0.1 - 1.0 K/uL   Eosinophils Relative 0 %   Eosinophils Absolute 0.0 0.0 - 0.5 K/uL   Basophils Relative 0 %   Basophils Absolute 0.0 0.0 - 0.1 K/uL   WBC Morphology MORPHOLOGY UNREMARKABLE    RBC Morphology MICROCYTES    Smear Review Normal platelet morphology    Immature Granulocytes 1 %   Abs Immature Granulocytes 0.03 0.00 -  0.07 K/uL   Schistocytes PRESENT     Comment: Performed at [facility], [address] [place], [place] [address]  Protime-INR     Status: None   Collection Time: 03/26/20 12:47 PM  Result Value Ref Range   Prothrombin Time 14.5 11.4 - 15.2 seconds   INR 1.2 0.8 - 1.2    Comment: (NOTE) INR goal varies based on device and disease states. Performed at [facility], [address], [place], [place] [address]   Pathologist smear review     Status: None   Collection Time: 03/26/20 12:47 PM  Result Value Ref Range   Path Review Blood smear is reviewed.     Comment: Patient presents with acute respiratory failure. Microcytic anemia with schistocyte present. Nucleated RBCs are not identified. WBC with early reactive changes. Thrombocytosis with variation in platelet size. These findings could be seen in iron deficiency. While schistocytes are also associated with microangiopathic anemias the presence of thrombocytosis and other lab values do not lend support. These findings were discussed with Dr. Merlene Pulling on 03/26/2020 at  4:30PM.  Reviewed by Georgiann Cocker. Oneita Kras, M.D. Performed at [facility], [address] [place], [place] [address]   Vitamin B12     Status: None   Collection Time: 03/26/20 12:47 PM  Result Value Ref Range   Vitamin B-12 804 180 - 914 pg/mL    Comment: (NOTE) This assay is not validated for testing neonatal  or myeloproliferative syndrome specimens for Vitamin B12 levels. Performed at [facility], [address] [place], [place] [address]   Folate     Status: None   Collection Time: 03/26/20 12:47 PM  Result Value Ref Range   Folate 13.5 >5.9 ng/mL    Comment: Performed at [facility], [address] [place], [place] [address]  Iron and TIBC     Status: Abnormal   Collection Time: 03/26/20 12:47 PM  Result Value Ref Range   Iron 8 (L) 28 - 170 ug/dL   TIBC 443 154 - 008 ug/dL   Saturation Ratios 2 (L) 10.4 - 31.8 %   UIBC 335 ug/dL    Comment: Performed at [facility], [address], [place], [place] [address]  Ferritin     Status: None   Collection Time: 03/26/20 12:47 PM  Result Value Ref Range   Ferritin 48 11 - 307 ng/mL    Comment: Performed at [facility], [address] [place], [place] [address]  Reticulocytes     Status: Abnormal   Collection Time: 03/26/20 12:47 PM  Result Value Ref Range   Retic Ct Pct 0.8 0.4 - 3.1 %   RBC. 3.61 (L) 3.87 - 5.11 MIL/uL   Retic Count, Absolute 27.4 19.0 - 186.0 K/uL   Immature Retic Fract 20.3 (H) 2.3 - 15.9 %    Comment: Performed at [facility], [address] [place], [place] [address]  Lactate dehydrogenase     Status: Abnormal   Collection Time: 03/26/20 12:47 PM  Result Value Ref Range   LDH 490 (H) 98 - 192 U/L    Comment: Performed at [facility], [address] [place], [place] [address]  Bilirubin, direct     Status: None   Collection Time: 03/26/20 12:47 PM  Result Value Ref Range   Bilirubin, Direct <0.1 0.0 - 0.2 mg/dL    Comment: Performed at [facility], [address], [place], [place] [address]  Fibrinogen  Status: Abnormal   Collection Time: 03/26/20 12:47 PM  Result Value Ref Range   Fibrinogen 575 (H) 210 - 475 mg/dL    Comment: Performed at [facility], [address] [place], [place] [address]  Fibrin derivatives  D-Dimer [facility] only)     Status: Abnormal   Collection Time: 03/26/20 12:47 PM  Result Value Ref Range   Fibrin derivatives D-dimer ([facility]) 1,839.18 (H) 0.00 - 499.00 ng/mL (FEU)    Comment: (NOTE) <> Exclusion of Venous Thromboembolism (VTE) - OUTPATIENT ONLY   (Emergency Department or [place])   0-499 ng/ml (FEU): With a low to intermediate pretest probability                      for VTE this test result excludes the diagnosis                      of VTE.   >499 ng/ml (FEU) : VTE not excluded; additional work up for VTE is                      required. <> Testing on Inpatients and Evaluation of Disseminated Intravascular   Coagulation (DIC) Reference Range:   0-499 ng/ml (FEU) Performed at [facility], [address] [place], [place] [address]   ABO/Rh     Status: None   Collection Time: 03/26/20 12:47 PM  Result Value Ref Range   ABO/RH(D)      B POS Performed at [facility], [address] [place], [place] [address]   Urinalysis, Complete w Microscopic     Status: Abnormal   Collection Time: 03/26/20 12:50 PM  Result Value Ref Range   Color, Urine AMBER (A) YELLOW    Comment: BIOCHEMICALS MAY BE AFFECTED BY COLOR   APPearance TURBID (A) CLEAR   Specific Gravity, Urine 1.026 1.005 - 1.030   pH 5.0 5.0 - 8.0   Glucose, UA NEGATIVE NEGATIVE mg/dL   Hgb urine dipstick MODERATE (A) NEGATIVE   Bilirubin Urine NEGATIVE NEGATIVE   Ketones, ur NEGATIVE NEGATIVE mg/dL   Protein, ur >=914 (A) NEGATIVE mg/dL   Nitrite NEGATIVE NEGATIVE   Leukocytes,Ua LARGE (A) NEGATIVE   RBC / HPF >50 (H) 0 - 5 RBC/hpf   WBC, UA >50 (H) 0 - 5 WBC/hpf   Bacteria, UA MANY (A) NONE SEEN   Squamous Epithelial / LPF 0-5 0 - 5    Comment: Performed at [facility], [address] [place], [place] [address]  Pregnancy, urine     Status: None   Collection Time: 03/26/20  1:55 PM  Result Value Ref Range   Preg Test, Ur NEGATIVE NEGATIVE    Comment: Performed at  [facility], [address] [place], [place] [address]  Urine Drug Screen, Qualitative ([facility] only)     Status: Abnormal   Collection Time: 03/26/20  1:55 PM  Result Value Ref Range   Tricyclic, Ur Screen POSITIVE (A) NONE DETECTED   Amphetamines, Ur Screen NONE DETECTED NONE DETECTED   MDMA (Ecstasy)Ur Screen NONE DETECTED NONE DETECTED   Cocaine Metabolite,Ur [place] NONE DETECTED NONE DETECTED   Opiate, Ur Screen NONE DETECTED NONE DETECTED   Phencyclidine (PCP) Ur S NONE DETECTED NONE DETECTED   Cannabinoid 50 Ng, Ur [place] NONE DETECTED NONE DETECTED   Barbiturates, Ur Screen NONE DETECTED NONE DETECTED   Benzodiazepine, Ur Scrn NONE DETECTED NONE DETECTED   Methadone Scn, Ur NONE  DETECTED NONE DETECTED    Comment: (NOTE) Tricyclics + metabolites, urine    Cutoff 1000 ng/mL Amphetamines + metabolites, urine  Cutoff 1000 ng/mL MDMA (Ecstasy), urine              Cutoff 500 ng/mL Cocaine Metabolite, urine          Cutoff 300 ng/mL Opiate + metabolites, urine        Cutoff 300 ng/mL Phencyclidine (PCP), urine         Cutoff 25 ng/mL Cannabinoid, urine                 Cutoff 50 ng/mL Barbiturates + metabolites, urine  Cutoff 200 ng/mL Benzodiazepine, urine              Cutoff 200 ng/mL Methadone, urine                   Cutoff 300 ng/mL The urine drug screen provides only a preliminary, unconfirmed analytical test result and should not be used for non-medical purposes. Clinical consideration and professional judgment should be applied to any positive drug screen result due to possible interfering substances. A more specific alternate chemical method must be used in order to obtain a confirmed analytical result. Gas chromatography / mass spectrometry (GC/MS) is the preferred confirmat ory method. Performed at [facility], [address] [place], [place] [address]   Lactic acid, plasma     Status: None   Collection Time: 03/26/20  2:34 PM  Result Value Ref Range    Lactic Acid, Venous 0.7 0.5 - 1.9 mmol/L    Comment: Performed at [facility], [address] [place], [place] [address]  SARS Coronavirus 2 by RT PCR (hospital order, performed in [facility] lab) Nasopharyngeal Nasopharyngeal Swab     Status: Abnormal   Collection Time: 03/26/20  2:47 PM   Specimen: Nasopharyngeal Swab  Result Value Ref Range   SARS Coronavirus 2 POSITIVE (A) NEGATIVE    Comment: RESULT CALLED TO, READ BACK BY AND VERIFIED WITH: CADY,R AT 1608 ON 03/26/2020 BY MOSLEY,J (NOTE) SARS-CoV-2 target nucleic acids are DETECTED SARS-CoV-2 RNA is generally detectable in upper respiratory specimens  during the acute phase of infection.  Positive results are indicative  of the presence of the identified virus, but do not rule out bacterial infection or co-infection with other pathogens not detected by the test.  Clinical correlation with patient history and  other diagnostic information is necessary to determine patient infection status.  The expected result is negative. Fact Sheet for Patients:   BoilerBrush.com.cyhttps://www.fda.gov/media/136312/download  Fact Sheet for Healthcare Providers:   https://pope.com/https://www.fda.gov/media/136313/download   This test is not yet approved or cleared by the [place] FDA and  has been authorized for detection and/or diagnosis of SARS-CoV-2 by FDA under an Emergency Use Authorization (EUA).  This EUA will remain in effect (meaning this test  can be used) for the duration of  the COVID-19 declaration under Section 564(b)(1) of the Act, 21 U.S.C. section 360-bbb-3(b)(1), unless the authorization is terminated or revoked sooner. Performed at [facility], [address] [place], [place] [address]   POC SARS Coronavirus 2 Ag     Status: None   Collection Time: 03/26/20  3:38 PM  Result Value Ref Range   SARS Coronavirus 2 Ag NEGATIVE NEGATIVE    Comment: (NOTE) SARS-CoV-2 antigen NOT DETECTED.  Negative results are presumptive.   Negative results do not preclude SARS-CoV-2 infection and should not be used  as the sole basis for treatment or other patient management decisions, including infection  control decisions, particularly in the presence of clinical signs and  symptoms consistent with COVID-19, or in those who have been in contact with the virus.  Negative results must be combined with clinical observations, patient history, and epidemiological information. The expected result is Negative. Fact Sheet for Patients: https://sanders-williams.net/ Fact Sheet for Healthcare Providers: https://martinez.com/ This test is not yet approved or cleared by the [place] FDA and  has been authorized for detection and/or diagnosis of SARS-CoV-2 by FDA under an Emergency Use Authorization (EUA).  This EUA will remain in effect (meaning this test can be used) for the duration of  the COVID-19 de claration under Section 564(b)(1) of the Act, 21 U.S.C. section 360bbb-3(b)(1), unless the authorization is terminated or revoked sooner.   Glucose, capillary     Status: None   Collection Time: 03/26/20  4:17 PM  Result Value Ref Range   Glucose-Capillary 94 70 - 99 mg/dL    Comment: Glucose reference range applies only to samples taken after fasting for at least 8 hours.  C-reactive protein     Status: Abnormal   Collection Time: 03/26/20  4:59 PM  Result Value Ref Range   CRP 9.1 (H) <1.0 mg/dL    Comment: Performed at [facility], [address] [place], [place] [address]  Comprehensive metabolic panel     Status: Abnormal   Collection Time: 03/26/20  4:59 PM  Result Value Ref Range   Sodium 140 135 - 145 mmol/L   Potassium 3.8 3.5 - 5.1 mmol/L   Chloride 113 (H) 98 - 111 mmol/L   CO2 14 (L) 22 - 32 mmol/L   Glucose, Bld 105 (H) 70 - 99 mg/dL    Comment: Glucose reference range applies only to samples taken after fasting for at least 8 hours.   BUN 69 (H) 6 - 20 mg/dL    Creatinine, Ser 6.04 (H) 0.44 - 1.00 mg/dL   Calcium 7.6 (L) 8.9 - 10.3 mg/dL   Total Protein 6.8 6.5 - 8.1 g/dL   Albumin 2.8 (L) 3.5 - 5.0 g/dL   AST 86 (H) 15 - 41 U/L   ALT 28 0 - 44 U/L   Alkaline Phosphatase 56 38 - 126 U/L   Total Bilirubin 0.5 0.3 - 1.2 mg/dL   GFR calc non Af Amer 6 (L) >60 mL/min   GFR calc Af Amer 7 (L) >60 mL/min   Anion gap 13 5 - 15    Comment: Performed at [facility], [address] [place], [place] [address]  Sample to Blood Bank     Status: None   Collection Time: 03/26/20  4:59 PM  Result Value Ref Range   Blood Bank Specimen SAMPLE AVAILABLE FOR TESTING    Sample Expiration      03/29/2020,2359 Performed at [facility], [address] [place], [place] [address]   Blood gas, arterial     Status: Abnormal   Collection Time: 03/26/20  5:37 PM  Result Value Ref Range   FIO2 0.32    Delivery systems NASAL CANNULA    pH, Arterial 7.29 (L) 7.350 - 7.450   pCO2 arterial 28 (L) 32.0 - 48.0 mmHg   pO2, Arterial 71 (L) 83.0 - 108.0 mmHg   Bicarbonate 13.5 (L) 20.0 - 28.0 mmol/L   Acid-base deficit 11.6 (H) 0.0 - 2.0 mmol/L   O2 Saturation 91.8 %   Patient  temperature 37.0    Collection site RIGHT RADIAL    Sample type ARTERIAL DRAW    Allens test (pass/fail) PASS PASS    Comment: Performed at [facility], [address] [place], [place] [address]   DG Abdomen 1 View  Result Date: 03/26/2020 CLINICAL DATA:  Shortness of breath.  Abdominal mass. EXAM: ABDOMEN - 1 VIEW COMPARISON:  None FINDINGS: Normal amount of gas within the intestine. No definite soft tissue mass by radiography. Some potential for increased density projected over the left pelvis, not a reliable finding. No abnormal calcifications or significant bone findings. IMPRESSION: Gas pattern within normal limits. Possible increased density of the left pelvis that could represent a mass. Electronically Signed   By: Paulina Fusi M.D.   On: 03/26/2020 13:44    DG Chest Portable 1 View  Result Date: 03/26/2020 CLINICAL DATA:  Shortness of breath and hypoxia. Abdominal mass. Fever and weakness. EXAM: PORTABLE CHEST 1 VIEW COMPARISON:  04/23/2012 FINDINGS: Cardiomegaly. Mediastinal shadows are normal. Areas of patchy bilateral pulmonary density that could represent patchy bronchopneumonia. An element of edema is possible. No visible effusion. No significant bone finding. IMPRESSION: Patchy bilateral pulmonary densities most consistent with pneumonia. Possible that there could be an element of edema. Electronically Signed   By: Paulina Fusi M.D.   On: 03/26/2020 13:43    Assessment:  The patient is a 44 y.o. woman admitted with COVID-19+ pneumonia, UTI, and acute renal insufficiency.  She has had decreased oral intake for 6 days.  She denies any new medications or herbal products.  Labs on admission revealed + COVID-19.  Hematocrit of 23.0, hemoglobin 6.9, MCV 65.0, platelets 473,000, WBC 6200 with an ANC of 4600.  PT and fibrinogen were normal.  D-dimers were 1839.18 (0-499).  Ferritin was 48 and iron saturation 2% and TIBC 343.  Retic was 0.8% (low).  B12 and folate were normal.  LDH was 490 (98-192).  BUN 69 and creatinine 7.10.  Albumen was 2.8 and bilirubin 0.5.  Procalcitonin was 7.60.  Lactic acid was 1.1.   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  There were no nucleated RBCs.  WBCs noted early reactive changes.  There was thrombocytosis with variation in platelet size.  She has severe iron deficiency anemia.  She denies any melena, hematochezia or hematuria.  She had had heavy menses.    Symptomatically, she feels weak and fatigued.  Exam reveals coarse breath sounds and no adenopathy or hepatosplenomegaly.  Plan:   1.   Iron deficiency anemia  Hematocrit 23.0.  Hemoglobin 6.9.  MCV 65.0.  Ferritin was 48 and iron saturation 2% and TIBC 343.     Suspect ferritin is actually much lower (acute phase reactant).  Etiology  appears secondary to menorrhagia.  She has a reactive thrombocytosis  Transfuse PRBcs as needed.   Monitor CBC daily. 2.   Elevated D-dimers  Etiology felt secondary to sepsis.  Normal PT and fibrinogen not c/w DIC.  No current evidence of DVT or PE.  Consider bilateral lower extremity duplex. 3.   Abnormal peripheral smear  Smear revealed 1 schistocyte per HPF.  No evidence for a microangiopathic hemolytic anemia (TTP).  Platelet count is normal/high c/w reactive thrombocytosis rather than consumption.  Bilirubin is normal.  Retic count is low.    LDH is mildly elevated.  Check Coombs.  PLASMIC score 4 (low risk).  Schistocytes likely secondary to sepsis. 4.   Acute renal insufficiency  Baseline creatinine was 0.87  Creatinine on admission 7.10.  Patient dehydrated with decreased fluid intake x 6 days.  No evidence of TTP.  Anticipate renal ultrasound to r/o obstruction and renal vein thrombosis.  Nephrology consulted. 5.   Sepsis  Patient COVID-19+ with patchy pulmonary infiltrates.  Patient with UTI.  Patient on azithromycin, ceftriaxone, remdesivir.  Patient on steroids.  Follow cultures.  Thank you for allowing me to participate in Diane Keller 's care.  I will follow her closely with you while hospitalized and after discharge in the outpatient department.   Lequita Asal, MD  03/26/2020, 9:54 PM

## 2020-03-26 NOTE — H&P (Addendum)
Name: Diane Keller MRN: [id number] DOB: [birth year]     CONSULTATION DATE: 03/26/2020  REFERRING MD :  Scotty Court  CHIEF COMPLAINT: SOB  STUDIES:  CXR B/L Interstitial infiltrates   HISTORY OF PRESENT ILLNESS:  44 y.o. female with a history of GERD and prior stroke with residual left-sided motor deficit who is brought to the ED due to fever and generalized weakness.  EMS described the patient is having hypotension prior to arrival, but lowest blood pressure they observed was 95/70.  Patient is having lightheadedness nausea and malaise as well as shortness of breath and cough.  COVID+  Acute renal failure creat 7.30 BUN 71 LDH 490, HBG 6.9 PLT 473 FDP 1839 INR 1.2 FIBRINOGEN 575 LA 0.7       PAST MEDICAL HISTORY :   has a past medical history of Anxiety, GERD (gastroesophageal reflux disease), and Stroke (HCC) (2018).  has a past surgical history that includes Cesarean section. Prior to Admission medications   Medication Sig Start Date End Date Taking? Authorizing Provider  Buprenorphine HCl-Naloxone HCl (SUBOXONE) 8-2 MG FILM Place 2 Film under the tongue daily.   Yes [provider]  labetalol (NORMODYNE) 300 MG tablet TAKE 1 TABLET (300 MG TOTAL) BY MOUTH 2 (TWO) TIMES DAILY. 06/13/19  Yes Malva Limes, MD  omeprazole (PRILOSEC) 20 MG capsule TAKE 1 CAPSULE BY MOUTH EVERY DAY Patient taking differently: Take 20 mg by mouth daily.  12/10/19  Yes Malva Limes, MD   Allergies  Allergen Reactions  . Augmentin [Amoxicillin-Pot Clavulanate] Itching  . Lisinopril Other (See Comments)    Cough    FAMILY HISTORY:  family history includes Cerebrovascular Accident in her father; Heart attack in her father; Hypertension in her father. SOCIAL HISTORY:  reports that she has quit smoking. Her smoking use included cigarettes. She has never used smokeless tobacco. She reports current alcohol use. She reports that she does not use drugs.  REVIEW OF SYSTEMS:    Unable to obtain due to critical illness      Estimated body mass index is 41.38 kg/m as calculated from the following:   Height as of this encounter: 5\' 6"  (1.676 m).   Weight as of this encounter: 116.3 kg.    VITAL SIGNS: Temp:  [100.6 F (38.1 C)-101.2 F (38.4 C)] 100.6 F (38.1 C) (05/13 1545) Pulse Rate:  [76-80] 80 (05/13 1545) Resp:  [26-37] 30 (05/13 1545) BP: (102-125)/(61-91) 125/72 (05/13 1530) SpO2:  [88 %-100 %] 92 % (05/13 1545) Weight:  [116.3 kg] 116.3 kg (05/13 1244)   No intake/output data recorded. Total I/O In: 260 [Other:10; IV Piggyback:250] Out: 75 [Urine:75]   SpO2: 92 % O2 Flow Rate (L/min): 3 L/min   Physical Examination:  GENERAL:critically ill appearing,  HEAD: Normocephalic, atraumatic.  EYES: Pupils equal, round, reactive to light.  No scleral icterus.  MOUTH: Moist mucosal membrane. NECK: Supple. No JVD.  PULMONARY: +rhonchi, CARDIOVASCULAR: S1 and S2. Regular rate and rhythm. No murmurs, rubs, or gallops.  GASTROINTESTINAL: Soft, nontender, -distended.  Positive bowel sounds.  MUSCULOSKELETAL: No swelling, clubbing, or edema.  NEUROLOGIC: alert and awake SKIN:intact,warm,dry   MEDICATIONS: I have reviewed all medications and confirmed regimen as documented   CULTURE RESULTS   Recent Results (from the past 240 hour(s))  SARS Coronavirus 2 by RT PCR (hospital order, performed in [facility] hospital lab) Nasopharyngeal Nasopharyngeal Swab     Status: Abnormal   Collection Time: 03/26/20  2:47 PM   Specimen: Nasopharyngeal Swab  Result Value Ref Range Status   SARS Coronavirus 2 POSITIVE (A) NEGATIVE Final    Comment: RESULT CALLED TO, READ BACK BY AND VERIFIED WITH: CADY,R AT 1608 ON 03/26/2020 BY MOSLEY,J (NOTE) SARS-CoV-2 target nucleic acids are DETECTED SARS-CoV-2 RNA is generally detectable in upper respiratory specimens  during the acute phase of infection.  Positive results are indicative  of the presence of the  identified virus, but do not rule out bacterial infection or co-infection with other pathogens not detected by the test.  Clinical correlation with patient history and  other diagnostic information is necessary to determine patient infection status.  The expected result is negative. Fact Sheet for Patients:   StrictlyIdeas.no  Fact Sheet for Healthcare Providers:   BankingDealers.co.za   This test is not yet approved or cleared by the [place] FDA and  has been authorized for detection and/or diagnosis of SARS-CoV-2 by FDA under an Emergency Use Authorization (EUA).  This EUA will remain in effect (meaning this test  can be used) for the duration of  the COVID-19 declaration under Section 564(b)(1) of the Act, 21 U.S.C. section 360-bbb-3(b)(1), unless the authorization is terminated or revoked sooner. Performed at [facility], [place], [place] [id number]           IMAGING    DG Abdomen 1 View  Result Date: 03/26/2020 CLINICAL DATA:  Shortness of breath.  Abdominal mass. EXAM: ABDOMEN - 1 VIEW COMPARISON:  None FINDINGS: Normal amount of gas within the intestine. No definite soft tissue mass by radiography. Some potential for increased density projected over the left pelvis, not a reliable finding. No abnormal calcifications or significant bone findings. IMPRESSION: Gas pattern within normal limits. Possible increased density of the left pelvis that could represent a mass. Electronically Signed   By: Nelson Chimes M.D.   On: 03/26/2020 13:44   DG Chest Portable 1 View  Result Date: 03/26/2020 CLINICAL DATA:  Shortness of breath and hypoxia. Abdominal mass. Fever and weakness. EXAM: PORTABLE CHEST 1 VIEW COMPARISON:  04/23/2012 FINDINGS: Cardiomegaly. Mediastinal shadows are normal. Areas of patchy bilateral pulmonary density that could represent patchy bronchopneumonia. An element of edema is possible. No  visible effusion. No significant bone finding. IMPRESSION: Patchy bilateral pulmonary densities most consistent with pneumonia. Possible that there could be an element of edema. Electronically Signed   By: Nelson Chimes M.D.   On: 03/26/2020 13:43     Nutrition Status:          ASSESSMENT AND PLAN SYNOPSIS  Severe COVID-19 infection, pneumonia/pneumonitis Continue IV steroids  IV remdisivir Aggressive pulm toilet recommended OOB to chair as tolerated  Pulmonary hygiene Continue proning as tolerated due to severe hypoxia   Maintain airborne and contact precautions  As needed bronchodilators (MDI) Vitamin C and zinc Antitussives High risk for intubation and death  Treat CAP INFECTIOUS DISEASE -continue antibiotics as prescribed -follow up cultures  CHECK ECHO  ACUTE KIDNEY INJURY/Renal Failure -continue Foley Catheter-assess need -Avoid nephrotoxic agents -Follow urine output, BMP -Ensure adequate renal perfusion, optimize oxygenation -Renal dose medications   SHOCK-SEPSIS/HYPOVOLUMIC/CARDIOGENIC -follow up cultures -emperic ABX -aggressive IV fluid resuscitation  CARDIAC ICU monitoring   GI GI PROPHYLAXIS as indicated  NUTRITIONAL STATUS DIET--> as tolerated Constipation protocol as indicated   ENDO - will use ICU hypoglycemic\Hyperglycemia protocol if needed    ELECTROLYTES -follow labs as needed -replace as needed -pharmacy consultation and following    DVT/GI PRX ordered and assessed TRANSFUSIONS AS NEEDED MONITOR  FSBS I Assessed the need for Labs I Assessed the need for Foley I Assessed the need for Central Venous Line Family Discussion when available I Assessed the need for Mobilization I made an Assessment of medications to be adjusted accordingly Safety Risk assessment Completed  CASE DISCUSSED IN MULTIDISCIPLINARY ROUNDS WITH ICU TEAM   Critical Care Time devoted to patient care services described in this note is 45 minutes.    Overall, patient is critically ill, prognosis is guarded.     Lucie Leather, M.D.  [facility]  Medical Director [facility] Medical Director [facility] Cardio-Pulmonary Department

## 2020-03-26 NOTE — Consult Note (Signed)
Remdesivir - Pharmacy Brief Note   O:  ALT: 27 CXR: Impression of "patchy bilateral pulmonary densities most consistent with pneumonia" SpO2: Hypoxic requiring 3L [place]   A/P:  5/13 SARS-CoV-2 PCR positive  Remdesivir 200 mg IVPB once followed by 100 mg IVPB daily x 4 days.   Dorothea Ogle Pharmacy Resident 03/26/2020 4:37 PM

## 2020-03-27 ENCOUNTER — Inpatient Hospital Stay: Payer: Medicaid Other

## 2020-03-27 ENCOUNTER — Inpatient Hospital Stay (HOSPITAL_COMMUNITY)
Admit: 2020-03-27 | Discharge: 2020-03-27 | Disposition: A | Discharge status: Home / Self Care | Payer: Medicaid Other | Attending: Internal Medicine | Admitting: Internal Medicine

## 2020-03-27 DIAGNOSIS — I340 Nonrheumatic mitral (valve) insufficiency: Secondary | ICD-10-CM

## 2020-03-27 DIAGNOSIS — I361 Nonrheumatic tricuspid (valve) insufficiency: Secondary | ICD-10-CM

## 2020-03-27 LAB — DAT, POLYSPECIFIC AHG (ARMC ONLY): Polyspecific AHG test: NEGATIVE

## 2020-03-27 LAB — CBC
HCT: 21.2 % — ABNORMAL LOW (ref 36.0–46.0)
Hemoglobin: 6.4 g/dL — ABNORMAL LOW (ref 12.0–15.0)
MCH: 19.6 pg — ABNORMAL LOW (ref 26.0–34.0)
MCHC: 30.2 g/dL (ref 30.0–36.0)
MCV: 65 fL — ABNORMAL LOW (ref 80.0–100.0)
Platelets: 494 10*3/uL — ABNORMAL HIGH (ref 150–400)
RBC: 3.26 MIL/uL — ABNORMAL LOW (ref 3.87–5.11)
RDW: 21.4 % — ABNORMAL HIGH (ref 11.5–15.5)
WBC: 6.3 10*3/uL (ref 4.0–10.5)
nRBC: 0.3 % — ABNORMAL HIGH (ref 0.0–0.2)

## 2020-03-27 LAB — COMPREHENSIVE METABOLIC PANEL
ALT: 33 U/L (ref 0–44)
AST: 94 U/L — ABNORMAL HIGH (ref 15–41)
Albumin: 2.8 g/dL — ABNORMAL LOW (ref 3.5–5.0)
Alkaline Phosphatase: 61 U/L (ref 38–126)
Anion gap: 12 (ref 5–15)
BUN: 77 mg/dL — ABNORMAL HIGH (ref 6–20)
CO2: 18 mmol/L — ABNORMAL LOW (ref 22–32)
Calcium: 7.9 mg/dL — ABNORMAL LOW (ref 8.9–10.3)
Chloride: 113 mmol/L — ABNORMAL HIGH (ref 98–111)
Creatinine, Ser: 6.52 mg/dL — ABNORMAL HIGH (ref 0.44–1.00)
GFR calc Af Amer: 8 mL/min — ABNORMAL LOW (ref >60)
GFR calc non Af Amer: 7 mL/min — ABNORMAL LOW (ref >60)
Glucose, Bld: 119 mg/dL — ABNORMAL HIGH (ref 70–99)
Potassium: 4 mmol/L (ref 3.5–5.1)
Sodium: 143 mmol/L (ref 135–145)
Total Bilirubin: 0.5 mg/dL (ref 0.3–1.2)
Total Protein: 7 g/dL (ref 6.5–8.1)

## 2020-03-27 LAB — PATHOLOGIST SMEAR REVIEW

## 2020-03-27 LAB — BLOOD GAS, ARTERIAL
Acid-base deficit: 9.1 mmol/L — ABNORMAL HIGH (ref 0.0–2.0)
Bicarbonate: 14.7 mmol/L — ABNORMAL LOW (ref 20.0–28.0)
FIO2: 8
O2 Saturation: 99.4 %
Patient temperature: 37
pCO2 arterial: 26 mmHg — ABNORMAL LOW (ref 32.0–48.0)
pH, Arterial: 7.36 (ref 7.350–7.450)
pO2, Arterial: 162 mmHg — ABNORMAL HIGH (ref 83.0–108.0)

## 2020-03-27 LAB — HIV ANTIBODY (ROUTINE TESTING W REFLEX): HIV Screen 4th Generation wRfx: NONREACTIVE

## 2020-03-27 LAB — FIBRIN DERIVATIVES D-DIMER (ARMC ONLY): Fibrin derivatives D-dimer (ARMC): 2049.37 ng/mL (FEU) — ABNORMAL HIGH (ref 0.00–499.00)

## 2020-03-27 LAB — HEMOGLOBIN A1C
Hgb A1c MFr Bld: 5.9 % — ABNORMAL HIGH (ref 4.8–5.6)
Mean Plasma Glucose: 122.63 mg/dL

## 2020-03-27 LAB — HAPTOGLOBIN: Haptoglobin: 365 mg/dL — ABNORMAL HIGH (ref 42–296)

## 2020-03-27 LAB — PREPARE RBC (CROSSMATCH)

## 2020-03-27 LAB — C-REACTIVE PROTEIN: CRP: 10.5 mg/dL — ABNORMAL HIGH (ref <1.0)

## 2020-03-27 MED ORDER — BLISTEX MEDICATED EX OINT
TOPICAL_OINTMENT | CUTANEOUS | Status: DC | PRN
  Filled 2020-03-27 (×2): qty 6.3

## 2020-03-27 MED ORDER — ENSURE ENLIVE PO LIQD
237.0000 mL | Freq: Three times a day (TID) | ORAL | Status: DC
  Administered 2020-03-27 – 2020-04-16 (×43): 237 mL via ORAL

## 2020-03-27 MED ORDER — SODIUM CHLORIDE 0.9% IV SOLUTION: Freq: Once | INTRAVENOUS | Status: DC

## 2020-03-27 MED ORDER — CHLORHEXIDINE GLUCONATE CLOTH 2 % EX PADS
6.0000 | MEDICATED_PAD | Freq: Every day | CUTANEOUS | Status: DC
  Administered 2020-03-27 – 2020-04-16 (×18): 6 via TOPICAL

## 2020-03-27 NOTE — Progress Notes (Signed)
*  PRELIMINARY RESULTS* Echocardiogram 2D Echocardiogram has been performed.  Diane Keller M Diane Keller 03/27/2020, 10:04 AM 

## 2020-03-27 NOTE — Progress Notes (Signed)
Patient refused dialysis and PRBC transfusion, as patient would like to hold off for now.  Patient educated and MD aware.

## 2020-03-27 NOTE — Progress Notes (Signed)
Initial Nutrition Assessment  DOCUMENTATION CODES:   Morbid obesity  INTERVENTION:  Provide Ensure Enlive po TID, each supplement provides 350 kcal and 20 grams of protein.  Provide Magic cup TID with meals, each supplement provides 290 kcal and 9 grams of protein.  NUTRITION DIAGNOSIS:   Increased nutrient needs related to catabolic illness(COVID-19) as evidenced by estimated needs.  GOAL:   Patient will meet greater than or equal to 90% of their needs  MONITOR:   PO intake, Supplement acceptance, Labs, Weight trends, I & O's  REASON FOR ASSESSMENT:   Consult Assessment of nutrition requirement/status  ASSESSMENT:   44 year old female with PMHx of GERD, anxiety, hx CVA 2018 admitted with COVID PNA and renal failure.   Attempted to call patient over the phone but she was unable to answer. Discussed with RN and on rounds. Patient is too short of breath to be able to talk on the phone at this time. Patient has had poor PO intake. Spoke with patient's husband over the phone. He reports patient's last good meal was on 5/8. Over the past few days she has only been able to take bites of watermelon. He reports at baseline patient had a good appetite and intake. Discussed catabolic nature of COVID-19 and increased nutrient needs. Husband reports patient would likely accept Ensure between meals.   Husband reports he is unsure if patient has lost any weight. According to chart patient was 116.3 kg on 03/29/2019. Patient is currently documented to be 116.3 kg (256.4 lbs) so unsure if this is a true measured weight or was pulled forward from last weight in chart taken one year ago.  Medications reviewed and include: vitamin C 500 mg daily, Decadron 6 mg daily IV, Protonix, zinc sulfate 220 mg daily, azithromycin, ceftriaxone, remdesivir, sodium bicarbonate 150 mEq in sterile water at 100 mL/hr.  Labs reviewed: Chloride 113, CO2 18, BUN 77, Creatinine 6.52.  NUTRITION - FOCUSED PHYSICAL  EXAM:  Unable to complete at this time.  Diet Order:   Diet Order            Diet regular Room service appropriate? Yes; Fluid consistency: Thin  Diet effective now             EDUCATION NEEDS:   No education needs have been identified at this time  Skin:  Skin Assessment: Reviewed RN Assessment  Last BM:  03/25/2020  Height:   Ht Readings from Last 1 Encounters:  03/26/20 5\' 6"  (1.676 m)   Weight:   Wt Readings from Last 1 Encounters:  03/26/20 116.3 kg   BMI:  Body mass index is 41.38 kg/m.  Estimated Nutritional Needs:   Kcal:  2300-2500  Protein:  115-125 grams  Fluid:  >/= 2 L/day  03/28/20, MS, RD, LDN Pager number available on Amion

## 2020-03-27 NOTE — Progress Notes (Signed)
Assisted tele visit to patient with husband.  Diane Vanvleck D Kendricks Reap, RN  

## 2020-03-27 NOTE — Progress Notes (Signed)
Pharmacy Electrolyte Monitoring Consult:  Pharmacy consulted to assist in monitoring and replacing electrolytes in this 44 y.o. female admitted on 03/26/2020 with COVID-19 infection, pneumonia/pneumonitis.   Labs:  Sodium (mmol/L)  Date Value  03/27/2020 143  05/11/2012 140   Potassium (mmol/L)  Date Value  03/27/2020 4.0  05/11/2012 3.2 (L)   Calcium (mg/dL)  Date Value  02/54/8628 7.9 (L)   Calcium, Total (mg/dL)  Date Value  24/17/5301 9.2   Albumin (g/dL)  Date Value  02/16/5912 2.8 (L)  05/11/2012 3.7    Assessment/Plan: 1. Electrolytes: No replacement necessary. Will follow AM labs and replace as indicated.   2. Glucose: patient on dexamethasone 6mg  IV daily. Continue to monitor with daily BMP.   Pharmacy will continue to monitor and adjust per consult.    03/27/2020 1:08 PM

## 2020-03-27 NOTE — Progress Notes (Signed)
[facility]  ROUNDING NOTE   Subjective:  Patient seen and evaluated. Still febrile. Has severe acute kidney injury. Urine output was only 75 cc over the preceding 24 hours. Advised patient that we should proceed with dialysis however patient states that she would like to hold off for now.   Objective:  Vital signs in last 24 hours:  Temp:  [97.9 F (36.6 C)-101.4 F (38.6 C)] 98.3 F (36.8 C) (05/14 0400) Pulse Rate:  [79-85] 85 (05/13 1615) Resp:  [24-36] 24 (05/13 1615) BP: (102-125)/(71-76) 104/74 (05/13 1645) SpO2:  [36 %-96 %] 96 % (05/13 1645)  Weight change:  Filed Weights   03/26/20 1244  Weight: 116.3 kg    Intake/Output: I/O last 3 completed shifts: In: 780 [I.V.:520; Other:10; IV Piggyback:250] Out: 75 [Urine:75]   Intake/Output this shift:  No intake/output data recorded.  Physical Exam: General: Critically ill-appearing  Head: Normocephalic, atraumatic. Moist oral mucosal membranes  Eyes: Anicteric  Neck: trachea midline  Lungs:  Slight tachypnea  Heart: No rubs  Extremities: No edema  Neuro: Awake, alert, conversant  Access:   None          Basic Metabolic Panel: Recent Labs  Lab 03/26/20 1247 03/26/20 1659 03/27/20 0538  NA 140 140 143  K 3.7 3.8 4.0  CL 110 113* 113*  CO2 16* 14* 18*  GLUCOSE 112* 105* 119*  BUN 71* 69* 77*  CREATININE 7.30* 7.10* 6.52*  CALCIUM 8.2* 7.6* 7.9*    Liver Function Tests: Recent Labs  Lab 03/26/20 1247 03/26/20 1659 03/27/20 0538  AST 84* 86* 94*  ALT 27 28 33  ALKPHOS 57 56 61  BILITOT 0.4 0.5 0.5  PROT 7.4 6.8 7.0  ALBUMIN 3.1* 2.8* 2.8*   No results for input(s): LIPASE, AMYLASE in the last 168 hours. No results for input(s): AMMONIA in the last 168 hours.  CBC: Recent Labs  Lab 03/26/20 1247 03/27/20 0538  WBC 6.2 6.3  NEUTROABS 4.6  --   HGB 6.9* 6.4*  HCT 23.0* 21.2*  MCV 65.0* 65.0*  PLT 473* 494*    Cardiac Enzymes: No results for input(s): CKTOTAL,  CKMB, CKMBINDEX, TROPONINI in the last 168 hours.  BNP: Invalid input(s): POCBNP  CBG: Recent Labs  Lab 03/26/20 1617  GLUCAP 94    Microbiology: Results for orders placed or performed during the hospital encounter of 03/26/20  Culture, blood (Routine x 2)     Status: None (Preliminary result)   Collection Time: 03/26/20 12:47 PM   Specimen: BLOOD  Result Value Ref Range Status   Specimen Description BLOOD BLOOD LEFT HAND  Final   Special Requests   Final    BOTTLES DRAWN AEROBIC AND ANAEROBIC Blood Culture adequate volume   Culture   Final    NO GROWTH < 24 HOURS Performed at [facility], [address] [place], [place] [address]    Report Status PENDING  Incomplete  Culture, blood (Routine x 2)     Status: None (Preliminary result)   Collection Time: 03/26/20 12:47 PM   Specimen: BLOOD  Result Value Ref Range Status   Specimen Description BLOOD LEFT ANTECUBITAL  Final   Special Requests   Final    BOTTLES DRAWN AEROBIC AND ANAEROBIC Blood Culture adequate volume   Culture   Final    NO GROWTH < 24 HOURS Performed at [facility], [address] [place], [place] [address]    Report Status PENDING  Incomplete  SARS Coronavirus 2  by RT PCR (hospital order, performed in [facility] hospital lab) Nasopharyngeal Nasopharyngeal Swab     Status: Abnormal   Collection Time: 03/26/20  2:47 PM   Specimen: Nasopharyngeal Swab  Result Value Ref Range Status   SARS Coronavirus 2 POSITIVE (A) NEGATIVE Final    Comment: RESULT CALLED TO, READ BACK BY AND VERIFIED WITH: CADY,R AT 1608 ON 03/26/2020 BY MOSLEY,J (NOTE) SARS-CoV-2 target nucleic acids are DETECTED SARS-CoV-2 RNA is generally detectable in upper respiratory specimens  during the acute phase of infection.  Positive results are indicative  of the presence of the identified virus, but do not rule out bacterial infection or co-infection with other pathogens not detected by the test.  Clinical  correlation with patient history and  other diagnostic information is necessary to determine patient infection status.  The expected result is negative. Fact Sheet for Patients:   StrictlyIdeas.no  Fact Sheet for Healthcare Providers:   BankingDealers.co.za   This test is not yet approved or cleared by the [place] FDA and  has been authorized for detection and/or diagnosis of SARS-CoV-2 by FDA under an Emergency Use Authorization (EUA).  This EUA will remain in effect (meaning this test  can be used) for the duration of  the COVID-19 declaration under Section 564(b)(1) of the Act, 21 U.S.C. section 360-bbb-3(b)(1), unless the authorization is terminated or revoked sooner. Performed at [facility], [address] [place], [place] [address]   MRSA PCR Screening     Status: None   Collection Time: 03/26/20  9:56 PM   Specimen: Nasopharyngeal  Result Value Ref Range Status   MRSA by PCR NEGATIVE NEGATIVE Final    Comment:        The GeneXpert MRSA Assay (FDA approved for NASAL specimens only), is one component of a comprehensive MRSA colonization surveillance program. It is not intended to diagnose MRSA infection nor to guide or monitor treatment for MRSA infections. Performed at [facility], [address] [place], [place] [address]     Coagulation Studies: Recent Labs    03/26/20 1247  LABPROT 14.5  INR 1.2    Urinalysis: Recent Labs    03/26/20 1250  COLORURINE AMBER*  LABSPEC 1.026  PHURINE 5.0  GLUCOSEU NEGATIVE  HGBUR MODERATE*  BILIRUBINUR NEGATIVE  KETONESUR NEGATIVE  PROTEINUR >=300*  NITRITE NEGATIVE  LEUKOCYTESUR LARGE*      Imaging: DG Abdomen 1 View  Result Date: 03/26/2020 CLINICAL DATA:  Shortness of breath.  Abdominal mass. EXAM: ABDOMEN - 1 VIEW COMPARISON:  None FINDINGS: Normal amount of gas within the intestine. No definite soft tissue mass by radiography. Some  potential for increased density projected over the left pelvis, not a reliable finding. No abnormal calcifications or significant bone findings. IMPRESSION: Gas pattern within normal limits. Possible increased density of the left pelvis that could represent a mass. Electronically Signed   By: Nelson Chimes M.D.   On: 03/26/2020 13:44   US RENAL  Result Date: 03/27/2020 CLINICAL DATA:  Acute tubular necrosis EXAM: RENAL / URINARY TRACT ULTRASOUND COMPLETE COMPARISON:  None. FINDINGS: Right Kidney: Renal measurements: 13 x 5 x 6 cm = volume: 214 mL. Increased echogenicity. No hydronephrosis or mass Left Kidney: Renal measurements: 14 x 7.4 x 6 cm = volume: 325 mL. Increased echogenicity. No hydronephrosis or mass Bladder: Moderate distension of the bladder despite a Foley catheter. No focal finding. Other: Cholelithiasis is incidentally noted.  No wall thickening. IMPRESSION: 1. 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 If renal failure is acute, inflammatory nephropathy would also be considered. 2. Cholelithiasis. 3. Moderate distension of the bladder despite a Foley catheter, please ensure the catheter is functional. Electronically Signed   By: Marnee Spring M.D.   On: 03/27/2020 05:27   US Venous Img Lower Bilateral (DVT)  Result Date: 03/27/2020 CLINICAL DATA:  Acute respiratory failure with hypoxia EXAM: BILATERAL LOWER EXTREMITY VENOUS DOPPLER ULTRASOUND TECHNIQUE: Gray-scale sonography with compression, as well as color and duplex ultrasound, were performed to evaluate the deep venous system(s) from the level of the common femoral vein through the popliteal and proximal calf veins. COMPARISON:  None. FINDINGS: VENOUS Normal compressibility of the common femoral, superficial femoral, and popliteal veins, as well as the visualized calf veins. Visualized portions of profunda femoral vein and great saphenous vein unremarkable. No filling defects to  suggest DVT on grayscale or color Doppler imaging. Doppler waveforms show normal direction of venous flow, normal respiratory plasticity and response to augmentation. Limited views of the contralateral common femoral vein are unremarkable. IMPRESSION: Negative. Electronically Signed   By: Marnee Spring M.D.   On: 03/27/2020 05:07   DG Chest Port 1 View  Result Date: 03/27/2020 CLINICAL DATA:  Short of breath, COVID-19 positive EXAM: PORTABLE CHEST 1 VIEW COMPARISON:  03/26/2020 FINDINGS: Single frontal view of the chest demonstrates progressive bilateral multifocal airspace disease consistent with pneumonia. No effusion or pneumothorax. Cardiac silhouette is stable. IMPRESSION: 1. Progressive multifocal bilateral pneumonia. Electronically Signed   By: Sharlet Salina M.D.   On: 03/27/2020 01:12   DG Chest Portable 1 View  Result Date: 03/26/2020 CLINICAL DATA:  Shortness of breath and hypoxia. Abdominal mass. Fever and weakness. EXAM: PORTABLE CHEST 1 VIEW COMPARISON:  04/23/2012 FINDINGS: Cardiomegaly. Mediastinal shadows are normal. Areas of patchy bilateral pulmonary density that could represent patchy bronchopneumonia. An element of edema is possible. No visible effusion. No significant bone finding. IMPRESSION: Patchy bilateral pulmonary densities most consistent with pneumonia. Possible that there could be an element of edema. Electronically Signed   By: Paulina Fusi M.D.   On: 03/26/2020 13:43   ECHOCARDIOGRAM COMPLETE BUBBLE STUDY  Result Date: 03/27/2020    ECHOCARDIOGRAM REPORT   Patient Name:   TASHIANA LAMARCA Date of Exam: 03/27/2020 Medical Rec #:  [id number]        Height:       66.0 in Accession #:    [id number]       Weight:       256.4 lb Date of Birth:  Nov 09, 1976         BSA:          2.222 m Patient Age:    44 years         BP:           104/74 mmHg Patient Gender: F                HR:           81 bpm. Exam Location:  [facility] Procedure: 2D Echo, Color Doppler and Cardiac Doppler  Indications:     I63.9 Stroke  History:         Patient has prior history of Echocardiogram examinations. Pt                  tested positive for covid-19 on 03/26/2020.  Sonographer:     Humphrey Rolls RDCS (AE) Referring Phys:  [id number] Erin Fulling Diagnosing Phys: Julien Nordmann MD IMPRESSIONS  1. Left ventricular ejection fraction, by estimation, is 60 to 65%. The left ventricle has normal function. The left ventricle has no regional wall motion abnormalities. There is mild left ventricular hypertrophy. Left ventricular diastolic parameters are consistent with Grade I diastolic dysfunction (impaired relaxation).  2. Right ventricular systolic function is normal. The right ventricular size is normal. There is mildly elevated pulmonary artery systolic pressure.  3. Mild mitral valve regurgitation.  4. Agitated saline contrast bubble study was negative, with no evidence of any interatrial shunt. FINDINGS  Left Ventricle: Left ventricular ejection fraction, by estimation, is 60 to 65%. The left ventricle has normal function. The left ventricle has no regional wall motion abnormalities. The left ventricular internal cavity size was normal in size. There is  mild left ventricular hypertrophy. Left ventricular diastolic parameters are consistent with Grade I diastolic dysfunction (impaired relaxation). Right Ventricle: The right ventricular size is normal. No increase in right ventricular wall thickness. Right ventricular systolic function is normal. There is mildly elevated pulmonary artery systolic pressure. The tricuspid regurgitant velocity is 3.03  m/s, and with an assumed right atrial pressure of 5 mmHg, the estimated right ventricular systolic pressure is 41.7 mmHg. Left Atrium: Left atrial size was normal in size. Right Atrium: Right atrial size was normal in size. Pericardium: There is no evidence of pericardial effusion. Mitral Valve: The mitral valve is normal in structure. Normal mobility of the mitral valve  leaflets. Mild mitral valve regurgitation. No evidence of mitral valve stenosis. MV peak gradient, 4.2 mmHg. The mean mitral valve gradient is 2.0 mmHg. Tricuspid Valve: The tricuspid valve is normal in structure. Tricuspid valve regurgitation is mild . No evidence of tricuspid stenosis. Aortic Valve: The aortic valve is normal in structure. Aortic valve regurgitation is not visualized. No aortic stenosis is present. Aortic valve mean gradient measures 5.0 mmHg. Aortic valve peak gradient measures 11.8 mmHg. Aortic valve area, by VTI measures 3.00 cm. Pulmonic Valve: The pulmonic valve was normal in structure. Pulmonic valve regurgitation is trivial. No evidence of pulmonic stenosis. Aorta: The aortic root is normal in size and structure. Venous: The inferior vena cava is normal in size with greater than 50% respiratory variability, suggesting right atrial pressure of 3 mmHg. IAS/Shunts: No atrial level shunt detected by color flow Doppler. Agitated saline contrast was given intravenously to evaluate for intracardiac shunting. Agitated saline contrast bubble study was negative, with no evidence of any interatrial shunt.  LEFT VENTRICLE PLAX 2D LVIDd:         4.70 cm  Diastology LVIDs:         2.72 cm  LV e' lateral:   6.31 cm/s LV PW:         1.28 cm  LV E/e' lateral: 12.8 LV IVS:        0.92 cm  LV e' medial:    5.66 cm/s LVOT diam:     2.10 cm  LV E/e' medial:  14.3 LV SV:         88 LV SV Index:   39 LVOT Area:     3.46 cm  RIGHT VENTRICLE RV Basal diam:  2.69 cm LEFT ATRIUM             Index       RIGHT ATRIUM           Index LA diam:        3.80 cm 1.71 cm/m  RA Area:     17.20 cm LA Vol (A2C):  75.5 ml 33.97 ml/m RA Volume:   42.10 ml  18.94 ml/m LA Vol (A4C):   47.6 ml 21.42 ml/m LA Biplane Vol: 61.5 ml 27.67 ml/m  AORTIC VALVE                    PULMONIC VALVE AV Area (Vmax):    2.66 cm     PV Vmax:       1.46 m/s AV Area (Vmean):   2.94 cm     PV Vmean:      101.000 cm/s AV Area (VTI):     3.00  cm     PV VTI:        0.287 m AV Vmax:           172.00 cm/s  PV Peak grad:  8.5 mmHg AV Vmean:          103.000 cm/s PV Mean grad:  5.0 mmHg AV VTI:            0.292 m AV Peak Grad:      11.8 mmHg AV Mean Grad:      5.0 mmHg LVOT Vmax:         132.00 cm/s LVOT Vmean:        87.500 cm/s LVOT VTI:          0.253 m LVOT/AV VTI ratio: 0.87  AORTA Ao Root diam: 3.30 cm MITRAL VALVE                TRICUSPID VALVE MV Area (PHT): 3.77 cm     TR Peak grad:   36.7 mmHg MV Peak grad:  4.2 mmHg     TR Vmax:        303.00 cm/s MV Mean grad:  2.0 mmHg MV Vmax:       1.02 m/s     SHUNTS MV Vmean:      69.8 cm/s    Systemic VTI:  0.25 m MV Decel Time: 201 msec     Systemic Diam: 2.10 cm MV E velocity: 81.00 cm/s MV A velocity: 108.00 cm/s MV E/A ratio:  0.75 Julien Nordmann MD Electronically signed by Julien Nordmann MD Signature Date/Time: 03/27/2020/1:53:37 PM    Final      Medications:   . sodium chloride    . azithromycin    . cefTRIAXone (ROCEPHIN)  IV    . remdesivir 100 mg in NS 100 mL    .  sodium bicarbonate (isotonic) infusion in sterile water 100 mL/hr at 03/27/20 0637   . sodium chloride   Intravenous Once  . aerochamber plus with mask  1 each Other Once  . albuterol  1 puff Inhalation Q4H  . vitamin C  500 mg Oral Daily  . buprenorphine-naloxone  1 tablet Sublingual Daily  . Chlorhexidine Gluconate Cloth  6 each Topical Daily  . ciclesonide  4 puff Inhalation BID  . dexamethasone (DECADRON) injection  6 mg Intravenous Daily  . heparin  5,000 Units Subcutaneous Q8H  . ipratropium  2 puff Inhalation Q4H  . melatonin  10 mg Oral QHS  . pantoprazole (PROTONIX) IV  40 mg Intravenous Daily  . sodium chloride flush  3 mL Intravenous Q12H  . zinc sulfate  220 mg Oral Daily   sodium chloride, acetaminophen, docusate sodium, lip balm, ondansetron (ZOFRAN) IV, polyethylene glycol, sodium chloride flush  Assessment/ Plan:  43 y.o. female with past medical history of anxiety, GERD, prior history of CVA  who presents with COVID-19 infection  and acute kidney injury.  1.  Acute kidney injury secondary to acute tubular necrosis. 2.  Metabolic acidosis serum bicarbonate currently 18. 3.  COVID-19 infection.  Plan: Patient is critically ill at the moment.  Urine output was only 75 cc over the preceding 24 hours.  We discussed this in depth with the patient today.  We have recommended renal placement therapy however patient wishes to hold off for another 24 hours before considering this possibility.  Patient advised of the dangers of this.  For now we will continue to monitor with plans to rediscussed hemodialysis with the patient again tomorrow.  For now maintain the patient on sodium bicarbonate drip and continue treatment of COVID-19 infection with remdesivir, dexamethasone.   LOS: 1 Nyaire Denbleyker 5/14/20212:29 PM

## 2020-03-27 NOTE — Progress Notes (Signed)
CRITICAL CARE NOTE  5/14 admitted for COVID pneumonia, anemia and Renal failure 5/15 8L [place], worsening renal function   CC  follow up COVID 19 pneumonia  SUBJECTIVE Patient remains critically ill Prognosis is guarded Progressive renal failure Increased oxygen requirements   BP 104/74 (BP Location: Right Arm)   Pulse 85   Temp 98.3 F (36.8 C) (Axillary)   Resp (!) 24   Ht 5\' 6"  (1.676 m)   Wt 116.3 kg   SpO2 96%   BMI 41.38 kg/m    I/O last 3 completed shifts: In: 780 [I.V.:520; Other:10; IV Piggyback:250] Out: 75 [Urine:75] No intake/output data recorded.  SpO2: 96 % O2 Flow Rate (L/min): 8 L/min  Estimated body mass index is 41.38 kg/m as calculated from the following:   Height as of this encounter: 5\' 6"  (1.676 m).   Weight as of this encounter: 116.3 kg.   Review of Systems:  Gen:  Denies  fever, sweats, chills weight loss  HEENT: Denies blurred vision, double vision, ear pain, eye pain, hearing loss, nose bleeds, sore throat Cardiac:  No dizziness, chest pain or heaviness, chest tightness,edema, No JVD Resp:   +shortness of breath,-wheezing, -hemoptysis,  Gi: Denies swallowing difficulty, stomach pain, nausea or vomiting, diarrhea, constipation, bowel incontinence Gu:  Denies bladder incontinence, burning urine Ext:   Denies Joint pain, stiffness or swelling Skin: Denies  skin rash, easy bruising or bleeding or hives Endoc:  Denies polyuria, polydipsia , polyphagia or weight change Psych:   Denies depression, insomnia or hallucinations  Other:  All other systems negative      BP 104/74 (BP Location: Right Arm)   Pulse 85   Temp 98.3 F (36.8 C) (Axillary)   Resp (!) 24   Ht 5\' 6"  (1.676 m)   Wt 116.3 kg   SpO2 96%   BMI 41.38 kg/m   PHYSICAL EXAMINATION:  GENERAL:critically ill appearing,  HEAD: Normocephalic, atraumatic.  EYES: Pupils equal, round, reactive to light.  No scleral icterus.  MOUTH: Moist mucosal membrane. NECK: Supple.   PULMONARY: +rhonchi,  CARDIOVASCULAR: S1 and S2. Regular rate and rhythm. No murmurs, rubs, or gallops.  GASTROINTESTINAL: Soft, nontender, -distended.  Positive bowel sounds.   MUSCULOSKELETAL: No swelling, clubbing, or edema.  NEUROLOGIC: alert and awake SKIN:intact,warm,dry  MEDICATIONS: I have reviewed all medications and confirmed regimen as documented   CULTURE RESULTS   Recent Results (from the past 240 hour(s))  Culture, blood (Routine x 2)     Status: None (Preliminary result)   Collection Time: 03/26/20 12:47 PM   Specimen: BLOOD  Result Value Ref Range Status   Specimen Description BLOOD BLOOD LEFT HAND  Final   Special Requests   Final    BOTTLES DRAWN AEROBIC AND ANAEROBIC Blood Culture adequate volume   Culture   Final    NO GROWTH < 24 HOURS Performed at [facility], [address] [place], [facility] [address]    Report Status PENDING  Incomplete  Culture, blood (Routine x 2)     Status: None (Preliminary result)   Collection Time: 03/26/20 12:47 PM   Specimen: BLOOD  Result Value Ref Range Status   Specimen Description BLOOD LEFT ANTECUBITAL  Final   Special Requests   Final    BOTTLES DRAWN AEROBIC AND ANAEROBIC Blood Culture adequate volume   Culture   Final    NO GROWTH < 24 HOURS Performed at [facility], [address], [place], [facility] [address]    Report Status  PENDING  Incomplete  SARS Coronavirus 2 by RT PCR (hospital order, performed in [facility] hospital lab) Nasopharyngeal Nasopharyngeal Swab     Status: Abnormal   Collection Time: 03/26/20  2:47 PM   Specimen: Nasopharyngeal Swab  Result Value Ref Range Status   SARS Coronavirus 2 POSITIVE (A) NEGATIVE Final    Comment: RESULT CALLED TO, READ BACK BY AND VERIFIED WITH: CADY,R AT 1608 ON 03/26/2020 BY MOSLEY,J (NOTE) SARS-CoV-2 target nucleic acids are DETECTED SARS-CoV-2 RNA is generally detectable in upper respiratory specimens  during the acute phase of infection.   Positive results are indicative  of the presence of the identified virus, but do not rule out bacterial infection or co-infection with other pathogens not detected by the test.  Clinical correlation with patient history and  other diagnostic information is necessary to determine patient infection status.  The expected result is negative. Fact Sheet for Patients:   BoilerBrush.com.cy  Fact Sheet for Healthcare Providers:   https://pope.com/   This test is not yet approved or cleared by the [place] FDA and  has been authorized for detection and/or diagnosis of SARS-CoV-2 by FDA under an Emergency Use Authorization (EUA).  This EUA will remain in effect (meaning this test  can be used) for the duration of  the COVID-19 declaration under Section 564(b)(1) of the Act, 21 U.S.C. section 360-bbb-3(b)(1), unless the authorization is terminated or revoked sooner. Performed at [facility], [address] [place], [place] [address]   MRSA PCR Screening     Status: None   Collection Time: 03/26/20  9:56 PM   Specimen: Nasopharyngeal  Result Value Ref Range Status   MRSA by PCR NEGATIVE NEGATIVE Final    Comment:        The GeneXpert MRSA Assay (FDA approved for NASAL specimens only), is one component of a comprehensive MRSA colonization surveillance program. It is not intended to diagnose MRSA infection nor to guide or monitor treatment for MRSA infections. Performed at [facility], [address] [place], [place] [address]           IMAGING    DG Abdomen 1 View  Result Date: 03/26/2020 CLINICAL DATA:  Shortness of breath.  Abdominal mass. EXAM: ABDOMEN - 1 VIEW COMPARISON:  None FINDINGS: Normal amount of gas within the intestine. No definite soft tissue mass by radiography. Some potential for increased density projected over the left pelvis, not a reliable finding. No abnormal calcifications or  significant bone findings. IMPRESSION: Gas pattern within normal limits. Possible increased density of the left pelvis that could represent a mass. Electronically Signed   By: Paulina Fusi M.D.   On: 03/26/2020 13:44   US RENAL  Result Date: 03/27/2020 CLINICAL DATA:  Acute tubular necrosis EXAM: RENAL / URINARY TRACT ULTRASOUND COMPLETE COMPARISON:  None. FINDINGS: Right Kidney: Renal measurements: 13 x 5 x 6 cm = volume: 214 mL. Increased echogenicity. No hydronephrosis or mass Left Kidney: Renal measurements: 14 x 7.4 x 6 cm = volume: 325 mL. Increased echogenicity. No hydronephrosis or mass Bladder: Moderate distension of the bladder despite a Foley catheter. No focal finding. Other: Cholelithiasis is incidentally noted.  No wall thickening. IMPRESSION: 1. 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 If renal failure is acute, inflammatory nephropathy would also be considered. 2. Cholelithiasis. 3. Moderate distension of the bladder despite a Foley catheter, please ensure the catheter is functional. Electronically Signed   By: Kathrynn Ducking.D.  On: 03/27/2020 05:27   US Venous Img Lower Bilateral (DVT)  Result Date: 03/27/2020 CLINICAL DATA:  Acute respiratory failure with hypoxia EXAM: BILATERAL LOWER EXTREMITY VENOUS DOPPLER ULTRASOUND TECHNIQUE: Gray-scale sonography with compression, as well as color and duplex ultrasound, were performed to evaluate the deep venous system(s) from the level of the common femoral vein through the popliteal and proximal calf veins. COMPARISON:  None. FINDINGS: VENOUS Normal compressibility of the common femoral, superficial femoral, and popliteal veins, as well as the visualized calf veins. Visualized portions of profunda femoral vein and great saphenous vein unremarkable. No filling defects to suggest DVT on grayscale or color Doppler imaging. Doppler waveforms show normal direction of venous flow, normal  respiratory plasticity and response to augmentation. Limited views of the contralateral common femoral vein are unremarkable. IMPRESSION: Negative. Electronically Signed   By: Monte Fantasia M.D.   On: 03/27/2020 05:07   DG Chest Port 1 View  Result Date: 03/27/2020 CLINICAL DATA:  Short of breath, COVID-19 positive EXAM: PORTABLE CHEST 1 VIEW COMPARISON:  03/26/2020 FINDINGS: Single frontal view of the chest demonstrates progressive bilateral multifocal airspace disease consistent with pneumonia. No effusion or pneumothorax. Cardiac silhouette is stable. IMPRESSION: 1. Progressive multifocal bilateral pneumonia. Electronically Signed   By: Randa Ngo M.D.   On: 03/27/2020 01:12   DG Chest Portable 1 View  Result Date: 03/26/2020 CLINICAL DATA:  Shortness of breath and hypoxia. Abdominal mass. Fever and weakness. EXAM: PORTABLE CHEST 1 VIEW COMPARISON:  04/23/2012 FINDINGS: Cardiomegaly. Mediastinal shadows are normal. Areas of patchy bilateral pulmonary density that could represent patchy bronchopneumonia. An element of edema is possible. No visible effusion. No significant bone finding. IMPRESSION: Patchy bilateral pulmonary densities most consistent with pneumonia. Possible that there could be an element of edema. Electronically Signed   By: Nelson Chimes M.D.   On: 03/26/2020 13:43     Nutrition Status:           ASSESSMENT AND PLAN SYNOPSIS  Severe COVID-19 infection,  pneumonia/pneumonitis Continue IV steroids  IV remdisivir Aggressive pulm toilet recommended OOB to chair as tolerated  Pulmonary hygiene Continue proning as tolerated due to severe hypoxia   Maintain airborne and contact precautions  As needed bronchodilators (MDI) Vitamin C and zinc Antitussives High risk for intubation and death    ACUTE KIDNEY INJURY/Renal Failure -continue Foley Catheter-assess need -Avoid nephrotoxic agents -Follow urine output, BMP -Ensure adequate renal perfusion, optimize  oxygenation -Renal dose medications Plan for HD today   Morbid obesity, possible OSA.   Will certainly impact respiratory mechanics    CARDIAC ICU monitoring  ID -continue IV abx as prescibed -follow up cultures  GI GI PROPHYLAXIS as indicated  NUTRITIONAL STATUS Nutrition Status:         DIET--> as tolerated Constipation protocol as indicated  ENDO - will use ICU hypoglycemic\Hyperglycemia protocol if indicated     ELECTROLYTES -follow labs as needed -replace as needed -pharmacy consultation and following   DVT/GI PRX ordered and assessed TRANSFUSIONS AS NEEDED MONITOR FSBS I Assessed the need for Labs I Assessed the need for Foley I Assessed the need for Central Venous Line Family Discussion when available I Assessed the need for Mobilization I made an Assessment of medications to be adjusted accordingly Safety Risk assessment completed   CASE DISCUSSED IN MULTIDISCIPLINARY ROUNDS WITH ICU TEAM  Critical Care Time devoted to patient care services described in this note is 34 minutes.   Overall, patient is critically ill, prognosis is guarded.  Corrin Parker, M.D.  [facility]  Medical Director [place] Director [facility] Cardio-Pulmonary Department

## 2020-03-28 LAB — COMPREHENSIVE METABOLIC PANEL
ALT: 29 U/L (ref 0–44)
AST: 67 U/L — ABNORMAL HIGH (ref 15–41)
Albumin: 2.5 g/dL — ABNORMAL LOW (ref 3.5–5.0)
Alkaline Phosphatase: 56 U/L (ref 38–126)
Anion gap: 14 (ref 5–15)
BUN: 84 mg/dL — ABNORMAL HIGH (ref 6–20)
CO2: 22 mmol/L (ref 22–32)
Calcium: 7.8 mg/dL — ABNORMAL LOW (ref 8.9–10.3)
Chloride: 107 mmol/L (ref 98–111)
Creatinine, Ser: 5.37 mg/dL — ABNORMAL HIGH (ref 0.44–1.00)
GFR calc Af Amer: 10 mL/min — ABNORMAL LOW (ref >60)
GFR calc non Af Amer: 9 mL/min — ABNORMAL LOW (ref >60)
Glucose, Bld: 140 mg/dL — ABNORMAL HIGH (ref 70–99)
Potassium: 3.5 mmol/L (ref 3.5–5.1)
Sodium: 143 mmol/L (ref 135–145)
Total Bilirubin: 0.4 mg/dL (ref 0.3–1.2)
Total Protein: 6.7 g/dL (ref 6.5–8.1)

## 2020-03-28 LAB — CBC
HCT: 19.9 % — ABNORMAL LOW (ref 36.0–46.0)
Hemoglobin: 6.2 g/dL — ABNORMAL LOW (ref 12.0–15.0)
MCH: 19.1 pg — ABNORMAL LOW (ref 26.0–34.0)
MCHC: 31.2 g/dL (ref 30.0–36.0)
MCV: 61.2 fL — ABNORMAL LOW (ref 80.0–100.0)
Platelets: 639 10*3/uL — ABNORMAL HIGH (ref 150–400)
RBC: 3.25 MIL/uL — ABNORMAL LOW (ref 3.87–5.11)
RDW: 20.8 % — ABNORMAL HIGH (ref 11.5–15.5)
WBC: 7.1 10*3/uL (ref 4.0–10.5)
nRBC: 0.4 % — ABNORMAL HIGH (ref 0.0–0.2)

## 2020-03-28 LAB — C-REACTIVE PROTEIN: CRP: 7.7 mg/dL — ABNORMAL HIGH (ref <1.0)

## 2020-03-28 LAB — C3 COMPLEMENT: C3 Complement: 119 mg/dL (ref 82–167)

## 2020-03-28 LAB — PREPARE RBC (CROSSMATCH)

## 2020-03-28 LAB — HEMOGLOBIN AND HEMATOCRIT, BLOOD
HCT: 23.5 % — ABNORMAL LOW (ref 36.0–46.0)
Hemoglobin: 7.4 g/dL — ABNORMAL LOW (ref 12.0–15.0)

## 2020-03-28 LAB — GLUCOSE, CAPILLARY
Glucose-Capillary: 145 mg/dL — ABNORMAL HIGH (ref 70–99)
Glucose-Capillary: 170 mg/dL — ABNORMAL HIGH (ref 70–99)

## 2020-03-28 LAB — FIBRIN DERIVATIVES D-DIMER (ARMC ONLY): Fibrin derivatives D-dimer (ARMC): 1679.05 ng/mL (FEU) — ABNORMAL HIGH (ref 0.00–499.00)

## 2020-03-28 LAB — C4 COMPLEMENT: Complement C4, Body Fluid: 40 mg/dL — ABNORMAL HIGH (ref 12–38)

## 2020-03-28 LAB — COMPLEMENT, TOTAL: Compl, Total (CH50): >60 U/mL (ref >41)

## 2020-03-28 MED ORDER — SODIUM CHLORIDE 0.9% IV SOLUTION: Freq: Once | INTRAVENOUS | Status: AC

## 2020-03-28 MED ORDER — DIPHENHYDRAMINE HCL 25 MG PO CAPS
25.0000 mg | ORAL_CAPSULE | Freq: Three times a day (TID) | ORAL | Status: DC | PRN
  Administered 2020-03-28: 25 mg via ORAL
  Filled 2020-03-28 (×3): qty 1

## 2020-03-28 NOTE — Progress Notes (Signed)
Pharmacy Electrolyte Monitoring Consult:  Pharmacy consulted to assist in monitoring and replacing electrolytes in this 44 y.o. female admitted on 03/26/2020 with COVID-19 infection, pneumonia/pneumonitis.   Labs:  Sodium (mmol/L)  Date Value  03/28/2020 143  05/11/2012 140   Potassium (mmol/L)  Date Value  03/28/2020 3.5  05/11/2012 3.2 (L)   Calcium (mg/dL)  Date Value  12/52/4799 7.8 (L)   Calcium, Total (mg/dL)  Date Value  80/11/2391 9.2   Albumin (g/dL)  Date Value  59/40/9050 2.5 (L)  05/11/2012 3.7   Corrected Ca: 9.0 mg/dL  Assessment/Plan: 1. Electrolytes:   No replacement necessary  follow AM labs and replace as indicated.   2. Glucose: patient on dexamethasone 6mg  IV daily (day #2)  Glucose controlled   Continue to monitor   Pharmacy will continue to monitor and adjust per consult.    03/28/2020 6:52 AM

## 2020-03-28 NOTE — Progress Notes (Signed)
Assisted tele visit to patient with family member.  Desiree Fleming P, RN  

## 2020-03-28 NOTE — Progress Notes (Signed)
[facility]  ROUNDING NOTE   Subjective:   Patient states she is feeling better. Eating more. Denies any subjective fevers or chills.   Continues to refuse blood transfusion.   Objective:  Vital signs in last 24 hours:  Temp:  [97.3 F (36.3 C)-98.1 F (36.7 C)] 98.1 F (36.7 C) (05/15 0800) Pulse Rate:  [72-79] 73 (05/15 0800) Resp:  [16-26] 24 (05/15 0800) BP: (124-157)/(79-92) 150/90 (05/15 0800) SpO2:  [92 %-96 %] 95 % (05/15 0813)  Weight change:  Filed Weights   03/26/20 1244  Weight: 116.3 kg    Intake/Output: I/O last 3 completed shifts: In: 3007.1 [I.V.:2678.1; IV Piggyback:329] Out: 1110 [Urine:1110]   Intake/Output this shift:  No intake/output data recorded.  Physical Exam: General: ill-appearing  Head: HFNC  Eyes: Anicteric  Neck: trachea midline  Lungs:    Heart: regular  Extremities: No edema  Neuro: Awake, alert, conversant  Access:   None          Basic Metabolic Panel: Recent Labs  Lab 03/26/20 1247 03/26/20 1247 03/26/20 1659 03/27/20 0538 03/28/20 0420  NA 140  --  140 143 143  K 3.7  --  3.8 4.0 3.5  CL 110  --  113* 113* 107  CO2 16*  --  14* 18* 22  GLUCOSE 112*  --  105* 119* 140*  BUN 71*  --  69* 77* 84*  CREATININE 7.30*  --  7.10* 6.52* 5.37*  CALCIUM 8.2*   < > 7.6* 7.9* 7.8*   < > = values in this interval not displayed.    Liver Function Tests: Recent Labs  Lab 03/26/20 1247 03/26/20 1659 03/27/20 0538 03/28/20 0420  AST 84* 86* 94* 67*  ALT 27 28 33 29  ALKPHOS 57 56 61 56  BILITOT 0.4 0.5 0.5 0.4  PROT 7.4 6.8 7.0 6.7  ALBUMIN 3.1* 2.8* 2.8* 2.5*   No results for input(s): LIPASE, AMYLASE in the last 168 hours. No results for input(s): AMMONIA in the last 168 hours.  CBC: Recent Labs  Lab 03/26/20 1247 03/27/20 0538 03/28/20 0420  WBC 6.2 6.3 7.1  NEUTROABS 4.6  --   --   HGB 6.9* 6.4* 6.2*  HCT 23.0* 21.2* 19.9*  MCV 65.0* 65.0* 61.2*  PLT 473* 494* 639*    Cardiac  Enzymes: No results for input(s): CKTOTAL, CKMB, CKMBINDEX, TROPONINI in the last 168 hours.  BNP: Invalid input(s): POCBNP  CBG: Recent Labs  Lab 03/26/20 1617 03/28/20 0851  GLUCAP 94 145*    Microbiology: Results for orders placed or performed during the hospital encounter of 03/26/20  Culture, blood (Routine x 2)     Status: None (Preliminary result)   Collection Time: 03/26/20 12:47 PM   Specimen: BLOOD  Result Value Ref Range Status   Specimen Description BLOOD BLOOD LEFT HAND  Final   Special Requests   Final    BOTTLES DRAWN AEROBIC AND ANAEROBIC Blood Culture adequate volume   Culture   Final    NO GROWTH 2 DAYS Performed at [facility], [address] [place], [place] [address]    Report Status PENDING  Incomplete  Culture, blood (Routine x 2)     Status: None (Preliminary result)   Collection Time: 03/26/20 12:47 PM   Specimen: BLOOD  Result Value Ref Range Status   Specimen Description BLOOD LEFT ANTECUBITAL  Final   Special Requests   Final    BOTTLES DRAWN AEROBIC AND ANAEROBIC Blood Culture adequate  volume   Culture   Final    NO GROWTH 2 DAYS Performed at [facility], [address] [place], [place] [address]    Report Status PENDING  Incomplete  Urine Culture     Status: Abnormal (Preliminary result)   Collection Time: 03/26/20  1:55 PM   Specimen: Urine, Random  Result Value Ref Range Status   Specimen Description   Final    URINE, RANDOM Performed at [facility], [address] [place], [place] [address]    Special Requests   Final    NONE Performed at [facility], [address] [place], [place] [address]    Culture >=100,000 COLONIES/mL ESCHERICHIA COLI (A)  Final   Report Status PENDING  Incomplete  SARS Coronavirus 2 by RT PCR (hospital order, performed in [facility] hospital lab) Nasopharyngeal Nasopharyngeal Swab     Status: Abnormal   Collection Time: 03/26/20  2:47 PM   Specimen:  Nasopharyngeal Swab  Result Value Ref Range Status   SARS Coronavirus 2 POSITIVE (A) NEGATIVE Final    Comment: RESULT CALLED TO, READ BACK BY AND VERIFIED WITH: CADY,R AT 1608 ON 03/26/2020 BY MOSLEY,J (NOTE) SARS-CoV-2 target nucleic acids are DETECTED SARS-CoV-2 RNA is generally detectable in upper respiratory specimens  during the acute phase of infection.  Positive results are indicative  of the presence of the identified virus, but do not rule out bacterial infection or co-infection with other pathogens not detected by the test.  Clinical correlation with patient history and  other diagnostic information is necessary to determine patient infection status.  The expected result is negative. Fact Sheet for Patients:   BoilerBrush.com.cy  Fact Sheet for Healthcare Providers:   https://pope.com/   This test is not yet approved or cleared by the [place] FDA and  has been authorized for detection and/or diagnosis of SARS-CoV-2 by FDA under an Emergency Use Authorization (EUA).  This EUA will remain in effect (meaning this test  can be used) for the duration of  the COVID-19 declaration under Section 564(b)(1) of the Act, 21 U.S.C. section 360-bbb-3(b)(1), unless the authorization is terminated or revoked sooner. Performed at [facility], [address] [place], [place] [address]   MRSA PCR Screening     Status: None   Collection Time: 03/26/20  9:56 PM   Specimen: Nasopharyngeal  Result Value Ref Range Status   MRSA by PCR NEGATIVE NEGATIVE Final    Comment:        The GeneXpert MRSA Assay (FDA approved for NASAL specimens only), is one component of a comprehensive MRSA colonization surveillance program. It is not intended to diagnose MRSA infection nor to guide or monitor treatment for MRSA infections. Performed at [facility], [address] [place], [place] [address]     Coagulation  Studies: Recent Labs    03/26/20 1247  LABPROT 14.5  INR 1.2    Urinalysis: Recent Labs    03/26/20 1250  COLORURINE AMBER*  LABSPEC 1.026  PHURINE 5.0  GLUCOSEU NEGATIVE  HGBUR MODERATE*  BILIRUBINUR NEGATIVE  KETONESUR NEGATIVE  PROTEINUR >=300*  NITRITE NEGATIVE  LEUKOCYTESUR LARGE*      Imaging: DG Abdomen 1 View  Result Date: 03/26/2020 CLINICAL DATA:  Shortness of breath.  Abdominal mass. EXAM: ABDOMEN - 1 VIEW COMPARISON:  None FINDINGS: Normal amount of gas within the intestine. No definite soft tissue mass by radiography. Some potential for increased density projected over the left pelvis, not a reliable finding.  No abnormal calcifications or significant bone findings. IMPRESSION: Gas pattern within normal limits. Possible increased density of the left pelvis that could represent a mass. Electronically Signed   By: Paulina Fusi M.D.   On: 03/26/2020 13:44   US RENAL  Result Date: 03/27/2020 CLINICAL DATA:  Acute tubular necrosis EXAM: RENAL / URINARY TRACT ULTRASOUND COMPLETE COMPARISON:  None. FINDINGS: Right Kidney: Renal measurements: 13 x 5 x 6 cm = volume: 214 mL. Increased echogenicity. No hydronephrosis or mass Left Kidney: Renal measurements: 14 x 7.4 x 6 cm = volume: 325 mL. Increased echogenicity. No hydronephrosis or mass Bladder: Moderate distension of the bladder despite a Foley catheter. No focal finding. Other: Cholelithiasis is incidentally noted.  No wall thickening. IMPRESSION: 1. 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 If renal failure is acute, inflammatory nephropathy would also be considered. 2. Cholelithiasis. 3. Moderate distension of the bladder despite a Foley catheter, please ensure the catheter is functional. Electronically Signed   By: Marnee Spring M.D.   On: 03/27/2020 05:27   US Venous Img Lower Bilateral (DVT)  Result Date: 03/27/2020 CLINICAL DATA:  Acute respiratory failure with  hypoxia EXAM: BILATERAL LOWER EXTREMITY VENOUS DOPPLER ULTRASOUND TECHNIQUE: Gray-scale sonography with compression, as well as color and duplex ultrasound, were performed to evaluate the deep venous system(s) from the level of the common femoral vein through the popliteal and proximal calf veins. COMPARISON:  None. FINDINGS: VENOUS Normal compressibility of the common femoral, superficial femoral, and popliteal veins, as well as the visualized calf veins. Visualized portions of profunda femoral vein and great saphenous vein unremarkable. No filling defects to suggest DVT on grayscale or color Doppler imaging. Doppler waveforms show normal direction of venous flow, normal respiratory plasticity and response to augmentation. Limited views of the contralateral common femoral vein are unremarkable. IMPRESSION: Negative. Electronically Signed   By: Marnee Spring M.D.   On: 03/27/2020 05:07   DG Chest Port 1 View  Result Date: 03/27/2020 CLINICAL DATA:  Short of breath, COVID-19 positive EXAM: PORTABLE CHEST 1 VIEW COMPARISON:  03/26/2020 FINDINGS: Single frontal view of the chest demonstrates progressive bilateral multifocal airspace disease consistent with pneumonia. No effusion or pneumothorax. Cardiac silhouette is stable. IMPRESSION: 1. Progressive multifocal bilateral pneumonia. Electronically Signed   By: Sharlet Salina M.D.   On: 03/27/2020 01:12   DG Chest Portable 1 View  Result Date: 03/26/2020 CLINICAL DATA:  Shortness of breath and hypoxia. Abdominal mass. Fever and weakness. EXAM: PORTABLE CHEST 1 VIEW COMPARISON:  04/23/2012 FINDINGS: Cardiomegaly. Mediastinal shadows are normal. Areas of patchy bilateral pulmonary density that could represent patchy bronchopneumonia. An element of edema is possible. No visible effusion. No significant bone finding. IMPRESSION: Patchy bilateral pulmonary densities most consistent with pneumonia. Possible that there could be an element of edema. Electronically  Signed   By: Paulina Fusi M.D.   On: 03/26/2020 13:43   ECHOCARDIOGRAM COMPLETE BUBBLE STUDY  Result Date: 03/27/2020    ECHOCARDIOGRAM REPORT   Patient Name:   GLYNDA SOLIDAY Date of Exam: 03/27/2020 Medical Rec #:  [id number]        Height:       66.0 in Accession #:    [id number]       Weight:       256.4 lb Date of Birth:  [birth year]         BSA:          2.222 m Patient Age:    44 years  BP:           104/74 mmHg Patient Gender: F                HR:           81 bpm. Exam Location:  [facility] Procedure: 2D Echo, Color Doppler and Cardiac Doppler Indications:     I63.9 Stroke  History:         Patient has prior history of Echocardiogram examinations. Pt                  tested positive for covid-19 on 03/26/2020.  Sonographer:     Humphrey RollsJoan Heiss RDCS (AE) Referring Phys:  [id number] Erin FullingKURIAN KASA Diagnosing Phys: Julien Nordmannimothy Gollan MD IMPRESSIONS  1. Left ventricular ejection fraction, by estimation, is 60 to 65%. The left ventricle has normal function. The left ventricle has no regional wall motion abnormalities. There is mild left ventricular hypertrophy. Left ventricular diastolic parameters are consistent with Grade I diastolic dysfunction (impaired relaxation).  2. Right ventricular systolic function is normal. The right ventricular size is normal. There is mildly elevated pulmonary artery systolic pressure.  3. Mild mitral valve regurgitation.  4. Agitated saline contrast bubble study was negative, with no evidence of any interatrial shunt. FINDINGS  Left Ventricle: Left ventricular ejection fraction, by estimation, is 60 to 65%. The left ventricle has normal function. The left ventricle has no regional wall motion abnormalities. The left ventricular internal cavity size was normal in size. There is  mild left ventricular hypertrophy. Left ventricular diastolic parameters are consistent with Grade I diastolic dysfunction (impaired relaxation). Right Ventricle: The right ventricular size is normal. No increase in  right ventricular wall thickness. Right ventricular systolic function is normal. There is mildly elevated pulmonary artery systolic pressure. The tricuspid regurgitant velocity is 3.03  m/s, and with an assumed right atrial pressure of 5 mmHg, the estimated right ventricular systolic pressure is 41.7 mmHg. Left Atrium: Left atrial size was normal in size. Right Atrium: Right atrial size was normal in size. Pericardium: There is no evidence of pericardial effusion. Mitral Valve: The mitral valve is normal in structure. Normal mobility of the mitral valve leaflets. Mild mitral valve regurgitation. No evidence of mitral valve stenosis. MV peak gradient, 4.2 mmHg. The mean mitral valve gradient is 2.0 mmHg. Tricuspid Valve: The tricuspid valve is normal in structure. Tricuspid valve regurgitation is mild . No evidence of tricuspid stenosis. Aortic Valve: The aortic valve is normal in structure. Aortic valve regurgitation is not visualized. No aortic stenosis is present. Aortic valve mean gradient measures 5.0 mmHg. Aortic valve peak gradient measures 11.8 mmHg. Aortic valve area, by VTI measures 3.00 cm. Pulmonic Valve: The pulmonic valve was normal in structure. Pulmonic valve regurgitation is trivial. No evidence of pulmonic stenosis. Aorta: The aortic root is normal in size and structure. Venous: The inferior vena cava is normal in size with greater than 50% respiratory variability, suggesting right atrial pressure of 3 mmHg. IAS/Shunts: No atrial level shunt detected by color flow Doppler. Agitated saline contrast was given intravenously to evaluate for intracardiac shunting. Agitated saline contrast bubble study was negative, with no evidence of any interatrial shunt.  LEFT VENTRICLE PLAX 2D LVIDd:         4.70 cm  Diastology LVIDs:         2.72 cm  LV e' lateral:   6.31 cm/s LV PW:         1.28 cm  LV E/e' lateral: 12.8 LV  IVS:        0.92 cm  LV e' medial:    5.66 cm/s LVOT diam:     2.10 cm  LV E/e' medial:   14.3 LV SV:         88 LV SV Index:   39 LVOT Area:     3.46 cm  RIGHT VENTRICLE RV Basal diam:  2.69 cm LEFT ATRIUM             Index       RIGHT ATRIUM           Index LA diam:        3.80 cm 1.71 cm/m  RA Area:     17.20 cm LA Vol (A2C):   75.5 ml 33.97 ml/m RA Volume:   42.10 ml  18.94 ml/m LA Vol (A4C):   47.6 ml 21.42 ml/m LA Biplane Vol: 61.5 ml 27.67 ml/m  AORTIC VALVE                    PULMONIC VALVE AV Area (Vmax):    2.66 cm     PV Vmax:       1.46 m/s AV Area (Vmean):   2.94 cm     PV Vmean:      101.000 cm/s AV Area (VTI):     3.00 cm     PV VTI:        0.287 m AV Vmax:           172.00 cm/s  PV Peak grad:  8.5 mmHg AV Vmean:          103.000 cm/s PV Mean grad:  5.0 mmHg AV VTI:            0.292 m AV Peak Grad:      11.8 mmHg AV Mean Grad:      5.0 mmHg LVOT Vmax:         132.00 cm/s LVOT Vmean:        87.500 cm/s LVOT VTI:          0.253 m LVOT/AV VTI ratio: 0.87  AORTA Ao Root diam: 3.30 cm MITRAL VALVE                TRICUSPID VALVE MV Area (PHT): 3.77 cm     TR Peak grad:   36.7 mmHg MV Peak grad:  4.2 mmHg     TR Vmax:        303.00 cm/s MV Mean grad:  2.0 mmHg MV Vmax:       1.02 m/s     SHUNTS MV Vmean:      69.8 cm/s    Systemic VTI:  0.25 m MV Decel Time: 201 msec     Systemic Diam: 2.10 cm MV E velocity: 81.00 cm/s MV A velocity: 108.00 cm/s MV E/A ratio:  0.75 Ida Rogue MD Electronically signed by Ida Rogue MD Signature Date/Time: 03/27/2020/1:53:37 PM    Final      Medications:   . sodium chloride    . azithromycin Stopped (03/27/20 1949)  . cefTRIAXone (ROCEPHIN)  IV Stopped (03/27/20 1919)  . remdesivir 100 mg in NS 100 mL 100 mg (03/28/20 0921)  .  sodium bicarbonate (isotonic) infusion in sterile water 100 mL/hr at 03/28/20 0500   . sodium chloride   Intravenous Once  . aerochamber plus with mask  1 each Other Once  . albuterol  1 puff Inhalation Q4H  . vitamin C  500 mg  Oral Daily  . buprenorphine-naloxone  1 tablet Sublingual Daily  . Chlorhexidine  Gluconate Cloth  6 each Topical Daily  . ciclesonide  4 puff Inhalation BID  . dexamethasone (DECADRON) injection  6 mg Intravenous Daily  . feeding supplement (ENSURE ENLIVE)  237 mL Oral TID BM  . heparin  5,000 Units Subcutaneous Q8H  . ipratropium  2 puff Inhalation Q4H  . melatonin  10 mg Oral QHS  . pantoprazole (PROTONIX) IV  40 mg Intravenous Daily  . sodium chloride flush  3 mL Intravenous Q12H  . zinc sulfate  220 mg Oral Daily   sodium chloride, acetaminophen, docusate sodium, lip balm, ondansetron (ZOFRAN) IV, polyethylene glycol, sodium chloride flush  Assessment/ Plan:  44 y.o. female  Ms. CANDIES PALM is a 44 y.o. black female with history of anxiety, GERD, prior history of CVA who presents with COVID-19 infection and acute kidney injury.  1.  Acute kidney injury secondary to acute tubular necrosis.Baseline creatinine of 0.87 in 2018.  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 Nonoliguric urine output Continue IVF  2. Metabolic acidosis - sodium bicarbonate infusion  3. Anemia with renal failure: hemoglobin 6.2.  PRBC blood transfusion  4. Thrombocytosis: with elevated D-dimer. Lower extremity dopplers are negative.   5. COVID-19 pneumonia on remdesivir, azithro, ceftriaxone, and dexamethazone. Requiring HFNC   LOS: 2 Liela Rylee 5/15/20219:36 AM

## 2020-03-28 NOTE — Progress Notes (Signed)
Assisted tele visit to patient with family member.  Diane Keller P, RN  

## 2020-03-28 NOTE — Progress Notes (Addendum)
CRITICAL CARE NOTE  5/14 admitted for COVID pneumonia, anemia and Renal failure 5/14 8L [place], worsening renal function 5/15 very good UOP, 1500 cc's  CC  follow up respiratory failure Follow up COVID  HPI Prognosis is guarded Not SOB Weaning fio2 as tolerated Alert and awake On bicarb infusion toleraing diet Patient refused blood products  CBC    Component Value Date/Time   WBC 7.1 03/28/2020 0420   RBC 3.25 (L) 03/28/2020 0420   HGB 6.2 (L) 03/28/2020 0420   HGB 11.2 (L) 05/11/2012 1254   HCT 19.9 (L) 03/28/2020 0420   HCT 34.5 (L) 05/11/2012 1254   PLT 639 (H) 03/28/2020 0420   PLT 268 05/11/2012 1254   MCV 61.2 (L) 03/28/2020 0420   MCV 88 05/11/2012 1254   MCH 19.1 (L) 03/28/2020 0420   MCHC 31.2 03/28/2020 0420   RDW 20.8 (H) 03/28/2020 0420   RDW 16.1 (H) 05/11/2012 1254   LYMPHSABS 1.1 03/26/2020 1247   LYMPHSABS 2.8 05/11/2012 1254   MONOABS 0.4 03/26/2020 1247   MONOABS 0.4 05/11/2012 1254   EOSABS 0.0 03/26/2020 1247   EOSABS 0.6 05/11/2012 1254   BASOSABS 0.0 03/26/2020 1247   BASOSABS 0.1 05/11/2012 1254   BMP Latest Ref Rng & Units 03/28/2020 03/27/2020 03/26/2020  Glucose 70 - 99 mg/dL 140(H) 119(H) 105(H)  BUN 6 - 20 mg/dL 84(H) 77(H) 69(H)  Creatinine 0.44 - 1.00 mg/dL 5.37(H) 6.52(H) 7.10(H)  Sodium 135 - 145 mmol/L 143 143 140  Potassium 3.5 - 5.1 mmol/L 3.5 4.0 3.8  Chloride 98 - 111 mmol/L 107 113(H) 113(H)  CO2 22 - 32 mmol/L 22 18(L) 14(L)  Calcium 8.9 - 10.3 mg/dL 7.8(L) 7.9(L) 7.6(L)     BP (!) 150/90 (BP Location: Right Arm)   Pulse 73   Temp 98.1 F (36.7 C) (Oral)   Resp (!) 24   Ht 5\' 6"  (1.676 m)   Wt 116.3 kg   SpO2 95%   BMI 41.38 kg/m    I/O last 3 completed shifts: In: 3007.1 [I.V.:2678.1; IV Piggyback:329] Out: 1110 [Urine:1110] No intake/output data recorded.  SpO2: 95 % O2 Flow Rate (L/min): 14 L/min  Estimated body mass index is 41.38 kg/m as calculated from the following:   Height as of this encounter: 5'  6" (1.676 m).   Weight as of this encounter: 116.3 kg.   Review of Systems:  Gen:  Denies  fever, sweats, chills weight loss  HEENT: Denies blurred vision, double vision, ear pain, eye pain, hearing loss, nose bleeds, sore throat Cardiac:  No dizziness, chest pain or heaviness, chest tightness,edema, No JVD Resp:   No cough, -sputum production, -shortness of breath,-wheezing, -hemoptysis,  Gi: Denies swallowing difficulty, stomach pain, nausea or vomiting, diarrhea, constipation, bowel incontinence Gu:  Denies bladder incontinence, burning urine Ext:   Denies Joint pain, stiffness or swelling Skin: Denies  skin rash, easy bruising or bleeding or hives Endoc:  Denies polyuria, polydipsia , polyphagia or weight change Psych:   Denies depression, insomnia or hallucinations  Other:  All other systems negative    PHYSICAL EXAMINATION:  GENERAL:critically ill appearing,  HEAD: Normocephalic, atraumatic.  EYES: Pupils equal, round, reactive to light.  No scleral icterus.  MOUTH: Moist mucosal membrane. NECK: Supple.  PULMONARY: +rhonchi,  CARDIOVASCULAR: S1 and S2. Regular rate and rhythm. No murmurs, rubs, or gallops.  GASTROINTESTINAL: Soft, nontender, -distended.  Positive bowel sounds.   MUSCULOSKELETAL: No swelling, clubbing, or edema.  NEUROLOGIC: alert and awake SKIN:intact,warm,dry  MEDICATIONS: I  have reviewed all medications and confirmed regimen as documented   CULTURE RESULTS   Recent Results (from the past 240 hour(s))  Culture, blood (Routine x 2)     Status: None (Preliminary result)   Collection Time: 03/26/20 12:47 PM   Specimen: BLOOD  Result Value Ref Range Status   Specimen Description BLOOD BLOOD LEFT HAND  Final   Special Requests   Final    BOTTLES DRAWN AEROBIC AND ANAEROBIC Blood Culture adequate volume   Culture   Final    NO GROWTH 2 DAYS Performed at [facility], [address], [place], [place] [address]    Report Status PENDING   Incomplete  Culture, blood (Routine x 2)     Status: None (Preliminary result)   Collection Time: 03/26/20 12:47 PM   Specimen: BLOOD  Result Value Ref Range Status   Specimen Description BLOOD LEFT ANTECUBITAL  Final   Special Requests   Final    BOTTLES DRAWN AEROBIC AND ANAEROBIC Blood Culture adequate volume   Culture   Final    NO GROWTH 2 DAYS Performed at [facility], [address] [place], [place] [address]    Report Status PENDING  Incomplete  Urine Culture     Status: Abnormal (Preliminary result)   Collection Time: 03/26/20  1:55 PM   Specimen: Urine, Random  Result Value Ref Range Status   Specimen Description   Final    URINE, RANDOM Performed at [facility], [address], [place], [place] [address]    Special Requests   Final    NONE Performed at [facility], [address] [place], [place] [address]    Culture >=100,000 COLONIES/mL ESCHERICHIA COLI (A)  Final   Report Status PENDING  Incomplete  SARS Coronavirus 2 by RT PCR (hospital order, performed in [facility] hospital lab) Nasopharyngeal Nasopharyngeal Swab     Status: Abnormal   Collection Time: 03/26/20  2:47 PM   Specimen: Nasopharyngeal Swab  Result Value Ref Range Status   SARS Coronavirus 2 POSITIVE (A) NEGATIVE Final    Comment: RESULT CALLED TO, READ BACK BY AND VERIFIED WITH: CADY,R AT 1608 ON 03/26/2020 BY MOSLEY,J (NOTE) SARS-CoV-2 target nucleic acids are DETECTED SARS-CoV-2 RNA is generally detectable in upper respiratory specimens  during the acute phase of infection.  Positive results are indicative  of the presence of the identified virus, but do not rule out bacterial infection or co-infection with other pathogens not detected by the test.  Clinical correlation with patient history and  other diagnostic information is necessary to determine patient infection status.  The expected result is negative. Fact Sheet for Patients:    BoilerBrush.com.cy  Fact Sheet for Healthcare Providers:   https://pope.com/   This test is not yet approved or cleared by the [place] FDA and  has been authorized for detection and/or diagnosis of SARS-CoV-2 by FDA under an Emergency Use Authorization (EUA).  This EUA will remain in effect (meaning this test  can be used) for the duration of  the COVID-19 declaration under Section 564(b)(1) of the Act, 21 U.S.C. section 360-bbb-3(b)(1), unless the authorization is terminated or revoked sooner. Performed at [facility], [address] [place], [place] [address]   MRSA PCR Screening     Status: None   Collection Time: 03/26/20  9:56 PM   Specimen: Nasopharyngeal  Result Value Ref Range Status   MRSA by PCR NEGATIVE NEGATIVE Final    Comment:  The GeneXpert MRSA Assay (FDA approved for NASAL specimens only), is one component of a comprehensive MRSA colonization surveillance program. It is not intended to diagnose MRSA infection nor to guide or monitor treatment for MRSA infections. Performed at [facility], [address], [place], [place] [address]           IMAGING    No results found.   Nutrition Status: Nutrition Problem: Increased nutrient needs Etiology: catabolic illness(COVID-19) Signs/Symptoms: estimated needs Interventions: Ensure Enlive (each supplement provides 350kcal and 20 grams of protein), Magic cup        ASSESSMENT AND PLAN SYNOPSIS  Severe COVID-19 infection, pneumonia/pneumonitis  IV steroids  IV remdisivir Aggressive pulm toilet recommended OOB to chair as tolerated  Pulmonary hygiene Continue proning as tolerated due to severe hypoxia   Maintain airborne and contact precautions  As needed bronchodilators (MDI) Vitamin C and zinc Antitussives   Morbid obesity, possible OSA.   Will certainly impact respiratory mechanics  ACUTE KIDNEY  INJURY/Renal Failure -continue Foley Catheter-assess need -Avoid nephrotoxic agents -Follow urine output, BMP -Ensure adequate renal perfusion, optimize oxygenation -Renal dose medications   CARDIAC ICU monitoring  ID -continue IV abx as prescibed -follow up cultures  GI GI PROPHYLAXIS as indicated  NUTRITIONAL STATUS Nutrition Status: Nutrition Problem: Increased nutrient needs Etiology: catabolic illness(COVID-19) Signs/Symptoms: estimated needs Interventions: Ensure Enlive (each supplement provides 350kcal and 20 grams of protein), Magic cup   DIET--> as tolerated Constipation protocol as indicated  ENDO - will use ICU hypoglycemic\Hyperglycemia protocol if indicated     ELECTROLYTES -follow labs as needed -replace as needed -pharmacy consultation and following   DVT/GI PRX ordered and assessed TRANSFUSIONS AS NEEDED MONITOR FSBS I Assessed the need for Labs I Assessed the need for Foley I Assessed the need for Central Venous Line Family Discussion when available I Assessed the need for Mobilization I made an Assessment of medications to be adjusted accordingly Safety Risk assessment completed      Lucie Leather, M.D.  [facility]  Medical Director [facility] Medical Director [facility] Cardio-Pulmonary Department

## 2020-03-29 LAB — CBC
HCT: 24.7 % — ABNORMAL LOW (ref 36.0–46.0)
Hemoglobin: 8 g/dL — ABNORMAL LOW (ref 12.0–15.0)
MCH: 20.6 pg — ABNORMAL LOW (ref 26.0–34.0)
MCHC: 32.4 g/dL (ref 30.0–36.0)
MCV: 63.7 fL — ABNORMAL LOW (ref 80.0–100.0)
Platelets: 610 10*3/uL — ABNORMAL HIGH (ref 150–400)
RBC: 3.88 MIL/uL (ref 3.87–5.11)
RDW: 22.4 % — ABNORMAL HIGH (ref 11.5–15.5)
WBC: 9.2 10*3/uL (ref 4.0–10.5)
nRBC: 0.5 % — ABNORMAL HIGH (ref 0.0–0.2)

## 2020-03-29 LAB — COMPREHENSIVE METABOLIC PANEL
ALT: 26 U/L (ref 0–44)
AST: 57 U/L — ABNORMAL HIGH (ref 15–41)
Albumin: 2.4 g/dL — ABNORMAL LOW (ref 3.5–5.0)
Alkaline Phosphatase: 54 U/L (ref 38–126)
Anion gap: 14 (ref 5–15)
BUN: 83 mg/dL — ABNORMAL HIGH (ref 6–20)
CO2: 26 mmol/L (ref 22–32)
Calcium: 7.7 mg/dL — ABNORMAL LOW (ref 8.9–10.3)
Chloride: 104 mmol/L (ref 98–111)
Creatinine, Ser: 3.92 mg/dL — ABNORMAL HIGH (ref 0.44–1.00)
GFR calc Af Amer: 15 mL/min — ABNORMAL LOW (ref >60)
GFR calc non Af Amer: 13 mL/min — ABNORMAL LOW (ref >60)
Glucose, Bld: 140 mg/dL — ABNORMAL HIGH (ref 70–99)
Potassium: 4.4 mmol/L (ref 3.5–5.1)
Sodium: 144 mmol/L (ref 135–145)
Total Bilirubin: 0.7 mg/dL (ref 0.3–1.2)
Total Protein: 6.4 g/dL — ABNORMAL LOW (ref 6.5–8.1)

## 2020-03-29 LAB — URINE CULTURE: Culture: >=100000 — AB

## 2020-03-29 LAB — GLUCOSE, CAPILLARY: Glucose-Capillary: 143 mg/dL — ABNORMAL HIGH (ref 70–99)

## 2020-03-29 LAB — FIBRIN DERIVATIVES D-DIMER (ARMC ONLY): Fibrin derivatives D-dimer (ARMC): 1321.47 ng/mL (FEU) — ABNORMAL HIGH (ref 0.00–499.00)

## 2020-03-29 LAB — PHOSPHORUS: Phosphorus: 4 mg/dL (ref 2.5–4.6)

## 2020-03-29 LAB — C-REACTIVE PROTEIN: CRP: 4.5 mg/dL — ABNORMAL HIGH (ref <1.0)

## 2020-03-29 LAB — MAGNESIUM: Magnesium: 1.6 mg/dL — ABNORMAL LOW (ref 1.7–2.4)

## 2020-03-29 MED ORDER — PANTOPRAZOLE SODIUM 40 MG PO TBEC
40.0000 mg | DELAYED_RELEASE_TABLET | Freq: Every day | ORAL | Status: DC
  Administered 2020-03-29 – 2020-03-30 (×2): 40 mg via ORAL
  Filled 2020-03-29 (×2): qty 1

## 2020-03-29 MED ORDER — MAGNESIUM SULFATE 2 GM/50ML IV SOLN
2.0000 g | Freq: Once | INTRAVENOUS | Status: AC
  Administered 2020-03-29: 2 g via INTRAVENOUS
  Filled 2020-03-29: qty 50

## 2020-03-29 MED ORDER — LABETALOL HCL 100 MG PO TABS
100.0000 mg | ORAL_TABLET | Freq: Two times a day (BID) | ORAL | Status: DC
  Administered 2020-03-29 – 2020-03-31 (×5): 100 mg via ORAL
  Filled 2020-03-29 (×8): qty 1

## 2020-03-29 NOTE — Progress Notes (Signed)
Patient remains AOx3 as she cannot keep track of the time.  She consumed more food today than yesterday.  She had a minor B/M before being transferred form bed to the chair.  Performed peri care and foley care.  Patient saturating around 94% on 13L HFNC.  She has good strength in her legs as she stood up from bed with little help needed from PT and RN.  Today she had two e-link meetings with her husband.  Her vitals remain WDL.  Her breath sounds are ronchus and course in all fields with diminishment to the bases.

## 2020-03-29 NOTE — Progress Notes (Signed)
Assisted tele visit to patient with family member.  Avraham Benish M, RN  

## 2020-03-29 NOTE — Progress Notes (Signed)
Pharmacy Electrolyte Monitoring Consult:  Pharmacy consulted to assist in monitoring and replacing electrolytes in this 44 y.o. female admitted on 03/26/2020 with COVID-19 infection, pneumonia/pneumonitis.   Labs:  Sodium (mmol/L)  Date Value  03/29/2020 144  05/11/2012 140   Potassium (mmol/L)  Date Value  03/29/2020 4.4  05/11/2012 3.2 (L)   Magnesium (mg/dL)  Date Value  50/51/8335 1.6 (L)   Phosphorus (mg/dL)  Date Value  82/51/8984 4.0   Calcium (mg/dL)  Date Value  21/01/1280 7.7 (L)   Calcium, Total (mg/dL)  Date Value  18/86/7737 9.2   Albumin (g/dL)  Date Value  36/68/1594 2.4 (L)  05/11/2012 3.7   Corrected Ca: 8.9 mg/dL  Assessment/Plan: 1. Electrolytes:   Magnesium sulfate 2 grams IV x 1  follow AM labs and replace as indicated  2. Glucose: patient on dexamethasone 6mg  IV daily (day #3)  Glucose controlled   Continue to monitor   Pharmacy will continue to monitor and adjust per consult.    03/29/2020 7:00 AM

## 2020-03-29 NOTE — Progress Notes (Signed)
[facility]  ROUNDING NOTE   Subjective:   PRBC transfusion yesterday.   UOP 1658mL.   Creatinine 3.92 (5.37)  Urine culture with E. Coli  Objective:  Vital signs in last 24 hours:  Temp:  [97.2 F (36.2 C)-98.5 F (36.9 C)] 98 F (36.7 C) (05/16 0800) Pulse Rate:  [74-81] 74 (05/16 0800) Resp:  [20-25] 22 (05/16 0800) BP: (136-170)/(86-106) 155/91 (05/16 0800) SpO2:  [92 %-97 %] 94 % (05/16 0816)  Weight change:  Filed Weights   03/26/20 1244  Weight: 116.3 kg    Intake/Output: I/O last 3 completed shifts: In: 5538.6 [I.V.:4169.5; Blood:340; IV Piggyback:1029.1] Out: 2735 [Urine:2735]   Intake/Output this shift:  Total I/O In: 99.9 [I.V.:99.9] Out: -   Physical Exam: General: ill-appearing  Head: HFNC  Eyes: Anicteric  Neck: trachea midline  Lungs:  clear  Heart: regular  Extremities: No edema  Neuro: Awake, alert, conversant  Access:   None          Basic Metabolic Panel: Recent Labs  Lab 03/26/20 1247 03/26/20 1247 03/26/20 1659 03/26/20 1659 03/27/20 0538 03/28/20 0420 03/29/20 0514  NA 140  --  140  --  143 143 144  K 3.7  --  3.8  --  4.0 3.5 4.4  CL 110  --  113*  --  113* 107 104  CO2 16*  --  14*  --  18* 22 26  GLUCOSE 112*  --  105*  --  119* 140* 140*  BUN 71*  --  69*  --  77* 84* 83*  CREATININE 7.30*  --  7.10*  --  6.52* 5.37* 3.92*  CALCIUM 8.2*   < > 7.6*   < > 7.9* 7.8* 7.7*  MG  --   --   --   --   --   --  1.6*  PHOS  --   --   --   --   --   --  4.0   < > = values in this interval not displayed.    Liver Function Tests: Recent Labs  Lab 03/26/20 1247 03/26/20 1659 03/27/20 0538 03/28/20 0420 03/29/20 0514  AST 84* 86* 94* 67* 57*  ALT 27 28 33 29 26  ALKPHOS 57 56 61 56 54  BILITOT 0.4 0.5 0.5 0.4 0.7  PROT 7.4 6.8 7.0 6.7 6.4*  ALBUMIN 3.1* 2.8* 2.8* 2.5* 2.4*   No results for input(s): LIPASE, AMYLASE in the last 168 hours. No results for input(s): AMMONIA in the last 168  hours.  CBC: Recent Labs  Lab 03/26/20 1247 03/27/20 0538 03/28/20 0420 03/28/20 1909 03/29/20 0514  WBC 6.2 6.3 7.1  --  9.2  NEUTROABS 4.6  --   --   --   --   HGB 6.9* 6.4* 6.2* 7.4* 8.0*  HCT 23.0* 21.2* 19.9* 23.5* 24.7*  MCV 65.0* 65.0* 61.2*  --  63.7*  PLT 473* 494* 639*  --  610*    Cardiac Enzymes: No results for input(s): CKTOTAL, CKMB, CKMBINDEX, TROPONINI in the last 168 hours.  BNP: Invalid input(s): POCBNP  CBG: Recent Labs  Lab 03/26/20 1617 03/28/20 0851 03/28/20 1620  GLUCAP 94 145* 170*    Microbiology: Results for orders placed or performed during the hospital encounter of 03/26/20  Culture, blood (Routine x 2)     Status: None (Preliminary result)   Collection Time: 03/26/20 12:47 PM   Specimen: BLOOD  Result Value Ref Range Status   Specimen  Description BLOOD BLOOD LEFT HAND  Final   Special Requests   Final    BOTTLES DRAWN AEROBIC AND ANAEROBIC Blood Culture adequate volume   Culture   Final    NO GROWTH 3 DAYS Performed at [facility], [address], [place], [place] [address]    Report Status PENDING  Incomplete  Culture, blood (Routine x 2)     Status: None (Preliminary result)   Collection Time: 03/26/20 12:47 PM   Specimen: BLOOD  Result Value Ref Range Status   Specimen Description BLOOD LEFT ANTECUBITAL  Final   Special Requests   Final    BOTTLES DRAWN AEROBIC AND ANAEROBIC Blood Culture adequate volume   Culture   Final    NO GROWTH 3 DAYS Performed at [facility], [address], [place], [place] [address]    Report Status PENDING  Incomplete  Urine Culture     Status: Abnormal   Collection Time: 03/26/20  1:55 PM   Specimen: Urine, Random  Result Value Ref Range Status   Specimen Description   Final    URINE, RANDOM Performed at [facility], [address] [place], [place] [address]    Special Requests   Final    NONE Performed at [facility], [address]  [place], [place] [address]    Culture >=100,000 COLONIES/mL ESCHERICHIA COLI (A)  Final   Report Status 03/29/2020 FINAL  Final   Organism ID, Bacteria ESCHERICHIA COLI (A)  Final      Susceptibility   Escherichia coli - MIC*    AMPICILLIN <=2 SENSITIVE Sensitive     CEFAZOLIN <=4 SENSITIVE Sensitive     CEFTRIAXONE <=1 SENSITIVE Sensitive     CIPROFLOXACIN <=0.25 SENSITIVE Sensitive     GENTAMICIN <=1 SENSITIVE Sensitive     IMIPENEM <=0.25 SENSITIVE Sensitive     NITROFURANTOIN <=16 SENSITIVE Sensitive     TRIMETH/SULFA <=20 SENSITIVE Sensitive     AMPICILLIN/SULBACTAM <=2 SENSITIVE Sensitive     PIP/TAZO <=4 SENSITIVE Sensitive     * >=100,000 COLONIES/mL ESCHERICHIA COLI  SARS Coronavirus 2 by RT PCR (hospital order, performed in [facility] hospital lab) Nasopharyngeal Nasopharyngeal Swab     Status: Abnormal   Collection Time: 03/26/20  2:47 PM   Specimen: Nasopharyngeal Swab  Result Value Ref Range Status   SARS Coronavirus 2 POSITIVE (A) NEGATIVE Final    Comment: RESULT CALLED TO, READ BACK BY AND VERIFIED WITH: CADY,R AT 1608 ON 03/26/2020 BY MOSLEY,J (NOTE) SARS-CoV-2 target nucleic acids are DETECTED SARS-CoV-2 RNA is generally detectable in upper respiratory specimens  during the acute phase of infection.  Positive results are indicative  of the presence of the identified virus, but do not rule out bacterial infection or co-infection with other pathogens not detected by the test.  Clinical correlation with patient history and  other diagnostic information is necessary to determine patient infection status.  The expected result is negative. Fact Sheet for Patients:   BoilerBrush.com.cy  Fact Sheet for Healthcare Providers:   https://pope.com/   This test is not yet approved or cleared by the [place] FDA and  has been authorized for detection and/or diagnosis of SARS-CoV-2 by FDA under an Emergency Use Authorization  (EUA).  This EUA will remain in effect (meaning this test  can be used) for the duration of  the COVID-19 declaration under Section 564(b)(1) of the Act, 21 U.S.C. section 360-bbb-3(b)(1), unless the authorization is terminated or revoked sooner. Performed at [facility], [address] [place], [place] [address]   MRSA PCR Screening     Status: None   Collection Time: 03/26/20  9:56 PM   Specimen: Nasopharyngeal  Result Value Ref Range Status   MRSA by PCR NEGATIVE NEGATIVE Final    Comment:        The GeneXpert MRSA Assay (FDA approved for NASAL specimens only), is one component of a comprehensive MRSA colonization surveillance program. It is not intended to diagnose MRSA infection nor to guide or monitor treatment for MRSA infections. Performed at [facility], [address] [place], [place] [address]     Coagulation Studies: Recent Labs    03/26/20 1247  LABPROT 14.5  INR 1.2    Urinalysis: Recent Labs    03/26/20 1250  COLORURINE AMBER*  LABSPEC 1.026  PHURINE 5.0  GLUCOSEU NEGATIVE  HGBUR MODERATE*  BILIRUBINUR NEGATIVE  KETONESUR NEGATIVE  PROTEINUR >=300*  NITRITE NEGATIVE  LEUKOCYTESUR LARGE*      Imaging: ECHOCARDIOGRAM COMPLETE BUBBLE STUDY  Result Date: 03/27/2020    ECHOCARDIOGRAM REPORT   Patient Name:   Diane HoopsCHRISTIE L Brinlee Date of Exam: 03/27/2020 Medical Rec #:  [id number]        Height:       66.0 in Accession #:    [id number]       Weight:       256.4 lb Date of Birth:  03/25/1976         BSA:          2.222 m Patient Age:    44 years         BP:           104/74 mmHg Patient Gender: F                HR:           81 bpm. Exam Location:  [facility] Procedure: 2D Echo, Color Doppler and Cardiac Doppler Indications:     I63.9 Stroke  History:         Patient has prior history of Echocardiogram examinations. Pt                  tested positive for covid-19 on 03/26/2020.  Sonographer:     Humphrey RollsJoan Heiss RDCS (AE) Referring Phys:  [id number]  Erin FullingKURIAN KASA Diagnosing Phys: Julien Nordmannimothy Gollan MD IMPRESSIONS  1. Left ventricular ejection fraction, by estimation, is 60 to 65%. The left ventricle has normal function. The left ventricle has no regional wall motion abnormalities. There is mild left ventricular hypertrophy. Left ventricular diastolic parameters are consistent with Grade I diastolic dysfunction (impaired relaxation).  2. Right ventricular systolic function is normal. The right ventricular size is normal. There is mildly elevated pulmonary artery systolic pressure.  3. Mild mitral valve regurgitation.  4. Agitated saline contrast bubble study was negative, with no evidence of any interatrial shunt. FINDINGS  Left Ventricle: Left ventricular ejection fraction, by estimation, is 60 to 65%. The left ventricle has normal function. The left ventricle has no regional wall motion abnormalities. The left ventricular internal cavity size was normal in size. There is  mild left ventricular hypertrophy. Left ventricular diastolic parameters are consistent with Grade I diastolic dysfunction (impaired relaxation). Right Ventricle: The right ventricular size is normal. No increase in right ventricular wall thickness. Right ventricular systolic function is normal. There is mildly elevated pulmonary artery systolic pressure. The tricuspid regurgitant velocity is 3.03  m/s, and with an assumed right atrial pressure  of 5 mmHg, the estimated right ventricular systolic pressure is 41.7 mmHg. Left Atrium: Left atrial size was normal in size. Right Atrium: Right atrial size was normal in size. Pericardium: There is no evidence of pericardial effusion. Mitral Valve: The mitral valve is normal in structure. Normal mobility of the mitral valve leaflets. Mild mitral valve regurgitation. No evidence of mitral valve stenosis. MV peak gradient, 4.2 mmHg. The mean mitral valve gradient is 2.0 mmHg. Tricuspid Valve: The tricuspid valve is normal in structure. Tricuspid valve  regurgitation is mild . No evidence of tricuspid stenosis. Aortic Valve: The aortic valve is normal in structure. Aortic valve regurgitation is not visualized. No aortic stenosis is present. Aortic valve mean gradient measures 5.0 mmHg. Aortic valve peak gradient measures 11.8 mmHg. Aortic valve area, by VTI measures 3.00 cm. Pulmonic Valve: The pulmonic valve was normal in structure. Pulmonic valve regurgitation is trivial. No evidence of pulmonic stenosis. Aorta: The aortic root is normal in size and structure. Venous: The inferior vena cava is normal in size with greater than 50% respiratory variability, suggesting right atrial pressure of 3 mmHg. IAS/Shunts: No atrial level shunt detected by color flow Doppler. Agitated saline contrast was given intravenously to evaluate for intracardiac shunting. Agitated saline contrast bubble study was negative, with no evidence of any interatrial shunt.  LEFT VENTRICLE PLAX 2D LVIDd:         4.70 cm  Diastology LVIDs:         2.72 cm  LV e' lateral:   6.31 cm/s LV PW:         1.28 cm  LV E/e' lateral: 12.8 LV IVS:        0.92 cm  LV e' medial:    5.66 cm/s LVOT diam:     2.10 cm  LV E/e' medial:  14.3 LV SV:         88 LV SV Index:   39 LVOT Area:     3.46 cm  RIGHT VENTRICLE RV Basal diam:  2.69 cm LEFT ATRIUM             Index       RIGHT ATRIUM           Index LA diam:        3.80 cm 1.71 cm/m  RA Area:     17.20 cm LA Vol (A2C):   75.5 ml 33.97 ml/m RA Volume:   42.10 ml  18.94 ml/m LA Vol (A4C):   47.6 ml 21.42 ml/m LA Biplane Vol: 61.5 ml 27.67 ml/m  AORTIC VALVE                    PULMONIC VALVE AV Area (Vmax):    2.66 cm     PV Vmax:       1.46 m/s AV Area (Vmean):   2.94 cm     PV Vmean:      101.000 cm/s AV Area (VTI):     3.00 cm     PV VTI:        0.287 m AV Vmax:           172.00 cm/s  PV Peak grad:  8.5 mmHg AV Vmean:          103.000 cm/s PV Mean grad:  5.0 mmHg AV VTI:            0.292 m AV Peak Grad:      11.8 mmHg AV Mean Grad:  5.0 mmHg LVOT  Vmax:         132.00 cm/s LVOT Vmean:        87.500 cm/s LVOT VTI:          0.253 m LVOT/AV VTI ratio: 0.87  AORTA Ao Root diam: 3.30 cm MITRAL VALVE                TRICUSPID VALVE MV Area (PHT): 3.77 cm     TR Peak grad:   36.7 mmHg MV Peak grad:  4.2 mmHg     TR Vmax:        303.00 cm/s MV Mean grad:  2.0 mmHg MV Vmax:       1.02 m/s     SHUNTS MV Vmean:      69.8 cm/s    Systemic VTI:  0.25 m MV Decel Time: 201 msec     Systemic Diam: 2.10 cm MV E velocity: 81.00 cm/s MV A velocity: 108.00 cm/s MV E/A ratio:  0.75 Julien Nordmann MD Electronically signed by Julien Nordmann MD Signature Date/Time: 03/27/2020/1:53:37 PM    Final      Medications:   . sodium chloride    . azithromycin Stopped (03/28/20 1955)  . cefTRIAXone (ROCEPHIN)  IV Stopped (03/28/20 1827)  . remdesivir 100 mg in NS 100 mL Stopped (03/28/20 0956)  .  sodium bicarbonate (isotonic) infusion in sterile water 100 mL/hr at 03/29/20 0800   . sodium chloride   Intravenous Once  . aerochamber plus with mask  1 each Other Once  . albuterol  1 puff Inhalation Q4H  . vitamin C  500 mg Oral Daily  . buprenorphine-naloxone  1 tablet Sublingual Daily  . Chlorhexidine Gluconate Cloth  6 each Topical Daily  . ciclesonide  4 puff Inhalation BID  . dexamethasone (DECADRON) injection  6 mg Intravenous Daily  . feeding supplement (ENSURE ENLIVE)  237 mL Oral TID BM  . heparin  5,000 Units Subcutaneous Q8H  . ipratropium  2 puff Inhalation Q4H  . labetalol  100 mg Oral BID  . melatonin  10 mg Oral QHS  . pantoprazole (PROTONIX) IV  40 mg Intravenous Daily  . sodium chloride flush  3 mL Intravenous Q12H  . zinc sulfate  220 mg Oral Daily   sodium chloride, acetaminophen, diphenhydrAMINE, docusate sodium, lip balm, ondansetron (ZOFRAN) IV, polyethylene glycol, sodium chloride flush  Assessment/ Plan:   Ms. Diane Keller is a 44 y.o. black female with history of anxiety, GERD, prior history of CVA who presents with COVID-19 infection  and acute kidney injury.  1.  Acute kidney injury secondary to acute tubular necrosis.Baseline creatinine of 0.87 in 2018.  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 Nonoliguric urine output Discontinue IVF - encourage PO intake.   2. Metabolic acidosis: improved on sodium bicarbonate infusion  3. Anemia with renal failure: hemoglobin 8 status post PRBC transfusion on 5/15.   4. Thrombocytosis: with elevated D-dimer. Lower extremity dopplers are negative.   5. COVID-19 pneumonia on remdesivir, azithro, ceftriaxone, and dexamethazone. Requiring HFNC Appreciate pulmonary input.   6. Urinary Tract Infection: E. Coli - treated with ceftriaxone.    LOS: 3 Sarath Kolluru 5/16/20219:27 AM

## 2020-03-29 NOTE — Progress Notes (Signed)
Patient remains AOx4.  She was lethargic for the majority of the day but awoke spontaneously.  She remains on 14L HFNC saturating around 95%.  Her vitals remain WDL.  Her UOP has been approx for the shift.  She consumed all of her meals.  Delivered 1 unit PRBC d/t Hgb 6.2.  Ordered H&H when PRBC infusion finished.  She complained of no pain. Patient is weak in her extremities.  No sign of pitting edema.  She has a congested cough.  Foley cath in place.  No B/M.  Rocephin and Azithromycin delivered.

## 2020-03-29 NOTE — Progress Notes (Signed)
CRITICAL CARE NOTE  5/14 admitted for COVID pneumonia, anemia and Renal failure 5/14 8L [place], worsening renal function 5/15 very good UOP, 1500 cc's 5/16 good UOP 1200CC's   CC  follow up respiratory failure Renal failure  SUBJECTIVE Prognosis is guarded 11 L [place] No SOB, resting comfortable Feels better creatinine improving H/H stable   BP (!) 155/91 (BP Location: Right Arm)   Pulse 74   Temp 98 F (36.7 C) (Oral)   Resp (!) 22   Ht 5\' 6"  (1.676 m)   Wt 116.3 kg   SpO2 94%   BMI 41.38 kg/m    I/O last 3 completed shifts: In: 5538.6 [I.V.:4169.5; Blood:340; IV Piggyback:1029.1] Out: 2735 [Urine:2735] Total I/O In: 99.9 [I.V.:99.9] Out: -   SpO2: 94 % O2 Flow Rate (L/min): 14 L/min  Estimated body mass index is 41.38 kg/m as calculated from the following:   Height as of this encounter: 5\' 6"  (1.676 m).   Weight as of this encounter: 116.3 kg.   Review of Systems:  Gen:  Denies  fever, sweats, chills weight loss  HEENT: Denies blurred vision, double vision, ear pain, eye pain, hearing loss, nose bleeds, sore throat Cardiac:  No dizziness, chest pain or heaviness, chest tightness,edema, No JVD Resp:   No cough, -sputum production, -shortness of breath,-wheezing, -hemoptysis,  Gi: Denies swallowing difficulty, stomach pain, nausea or vomiting, diarrhea, constipation, bowel incontinence Gu:  Denies bladder incontinence, burning urine Ext:   Denies Joint pain, stiffness or swelling Skin: Denies  skin rash, easy bruising or bleeding or hives Endoc:  Denies polyuria, polydipsia , polyphagia or weight change Psych:   Denies depression, insomnia or hallucinations  Other:  All other systems negative   Physical Examination:   General Appearance: No distress  Neuro:without focal findings,  speech normal,  HEENT: PERRLA, EOM intact.   Pulmonary: normal breath sounds, No wheezing.  CardiovascularNormal S1,S2.  No m/r/g.   Abdomen: Benign, Soft, non-tender. Renal:   No costovertebral tenderness  GU:  Not performed at this time. Endoc: No evident thyromegaly Skin:   warm, no rashes, no ecchymosis  Extremities: normal, no cyanosis, clubbing. PSYCHIATRIC: Mood, affect within normal limits.   ALL OTHER ROS ARE NEGATIVE   MEDICATIONS: I have reviewed all medications and confirmed regimen as documented   CULTURE RESULTS   Recent Results (from the past 240 hour(s))  Culture, blood (Routine x 2)     Status: None (Preliminary result)   Collection Time: 03/26/20 12:47 PM   Specimen: BLOOD  Result Value Ref Range Status   Specimen Description BLOOD BLOOD LEFT HAND  Final   Special Requests   Final    BOTTLES DRAWN AEROBIC AND ANAEROBIC Blood Culture adequate volume   Culture   Final    NO GROWTH 3 DAYS Performed at [facility], [address], [place], [address] [place]    Report Status PENDING  Incomplete  Culture, blood (Routine x 2)     Status: None (Preliminary result)   Collection Time: 03/26/20 12:47 PM   Specimen: BLOOD  Result Value Ref Range Status   Specimen Description BLOOD LEFT ANTECUBITAL  Final   Special Requests   Final    BOTTLES DRAWN AEROBIC AND ANAEROBIC Blood Culture adequate volume   Culture   Final    NO GROWTH 3 DAYS Performed at [facility], [address], [place], [address] [place]    Report Status PENDING  Incomplete  Urine Culture     Status: Abnormal  Collection Time: 03/26/20  1:55 PM   Specimen: Urine, Random  Result Value Ref Range Status   Specimen Description   Final    URINE, RANDOM Performed at [facility], [address] [place], [place] [address]    Special Requests   Final    NONE Performed at [facility], [address] [place], [place] [address]    Culture >=100,000 COLONIES/mL ESCHERICHIA COLI (A)  Final   Report Status 03/29/2020 FINAL  Final   Organism ID, Bacteria ESCHERICHIA COLI (A)  Final      Susceptibility   Escherichia coli - MIC*     AMPICILLIN <=2 SENSITIVE Sensitive     CEFAZOLIN <=4 SENSITIVE Sensitive     CEFTRIAXONE <=1 SENSITIVE Sensitive     CIPROFLOXACIN <=0.25 SENSITIVE Sensitive     GENTAMICIN <=1 SENSITIVE Sensitive     IMIPENEM <=0.25 SENSITIVE Sensitive     NITROFURANTOIN <=16 SENSITIVE Sensitive     TRIMETH/SULFA <=20 SENSITIVE Sensitive     AMPICILLIN/SULBACTAM <=2 SENSITIVE Sensitive     PIP/TAZO <=4 SENSITIVE Sensitive     * >=100,000 COLONIES/mL ESCHERICHIA COLI  SARS Coronavirus 2 by RT PCR (hospital order, performed in [facility] hospital lab) Nasopharyngeal Nasopharyngeal Swab     Status: Abnormal   Collection Time: 03/26/20  2:47 PM   Specimen: Nasopharyngeal Swab  Result Value Ref Range Status   SARS Coronavirus 2 POSITIVE (A) NEGATIVE Final    Comment: RESULT CALLED TO, READ BACK BY AND VERIFIED WITH: CADY,R AT 1608 ON 03/26/2020 BY MOSLEY,J (NOTE) SARS-CoV-2 target nucleic acids are DETECTED SARS-CoV-2 RNA is generally detectable in upper respiratory specimens  during the acute phase of infection.  Positive results are indicative  of the presence of the identified virus, but do not rule out bacterial infection or co-infection with other pathogens not detected by the test.  Clinical correlation with patient history and  other diagnostic information is necessary to determine patient infection status.  The expected result is negative. Fact Sheet for Patients:   BoilerBrush.com.cy  Fact Sheet for Healthcare Providers:   https://pope.com/   This test is not yet approved or cleared by the [place] FDA and  has been authorized for detection and/or diagnosis of SARS-CoV-2 by FDA under an Emergency Use Authorization (EUA).  This EUA will remain in effect (meaning this test  can be used) for the duration of  the COVID-19 declaration under Section 564(b)(1) of the Act, 21 U.S.C. section 360-bbb-3(b)(1), unless the authorization is terminated or  revoked sooner. Performed at [facility], [address] [place], [place] [address]   MRSA PCR Screening     Status: None   Collection Time: 03/26/20  9:56 PM   Specimen: Nasopharyngeal  Result Value Ref Range Status   MRSA by PCR NEGATIVE NEGATIVE Final    Comment:        The GeneXpert MRSA Assay (FDA approved for NASAL specimens only), is one component of a comprehensive MRSA colonization surveillance program. It is not intended to diagnose MRSA infection nor to guide or monitor treatment for MRSA infections. Performed at [facility], [address] [place], [place] [address]           IMAGING    No results found.   Nutrition Status: Nutrition Problem: Increased nutrient needs Etiology: catabolic illness(COVID-19) Signs/Symptoms: estimated needs Interventions: Ensure Enlive (each supplement provides 350kcal and 20 grams of protein), Magic cup     ASSESSMENT AND PLAN SYNOPSIS Severe COVID-19  infection-pneumonia/pneumonitis IV steroids  IV remdisivir Aggressive pulm toilet recommended OOB to chair as tolerated  Pulmonary hygiene Continue proning as tolerated due to severe hypoxia   Maintain airborne and contact precautions  As needed bronchodilators (MDI) Vitamin C and zinc Antitussives   Morbid obesity, possible OSA.   Will certainly impact respiratory mechanics   ACUTE KIDNEY INJURY/Renal Failure -continue Foley Catheter-assess need -Avoid nephrotoxic agents -Follow urine output, BMP -Ensure adequate renal perfusion, optimize oxygenation -Renal dose medications  CARDIAC ICU monitoring  ID -continue IV abx as prescibed -follow up cultures  GI GI PROPHYLAXIS as indicated  NUTRITIONAL STATUS Nutrition Status: Nutrition Problem: Increased nutrient needs Etiology: catabolic [id number]) Signs/Symptoms: estimated needs Interventions: Ensure Enlive (each supplement provides 350kcal and 20 grams of protein),  Magic cup   DIET-->as tolerated Constipation protocol as indicated  ENDO - will use ICU hypoglycemic\Hyperglycemia protocol if indicated     ELECTROLYTES -follow labs as needed -replace as needed -pharmacy consultation and following   DVT/GI PRX ordered and assessed TRANSFUSIONS AS NEEDED MONITOR FSBS I Assessed the need for Labs I Assessed the need for Foley I Assessed the need for Central Venous Line Family Discussion when available I Assessed the need for Mobilization I made an Assessment of medications to be adjusted accordingly Safety Risk assessment completed   CASE DISCUSSED IN MULTIDISCIPLINARY ROUNDS WITH ICU TEAM    Delila Kuklinski Patricia Pesa, M.D.  [facility]  Medical Director [place] Director [place] Department

## 2020-03-29 NOTE — Progress Notes (Signed)
Assisted tele visit to patient with husband.  Diane Keller M, RN  

## 2020-03-29 NOTE — Progress Notes (Signed)
Physical Therapy Evaluation Patient Details Name: Diane Keller MRN: [id number] DOB: March 05, 1976 Today's Date: 03/29/2020   History of Present Illness  Per MD note:44 y.o.femalewith a history of GERD and prior stroke with residual left-sided motor deficit who is brought to the ED due to fever and generalized weakness.  Clinical Impression  Patient agrees to PT evaluation. She has decreased strength -3/5 BLE hip flex, 3/5 knee extension. She is alert and orientated x 3. She is fearful to perform bed mobility and transfer to recliner. She performs bed mobility with extra time and min assist supine to sit , and use of head of bed to raise her up. She needs min assist to scoot fwd in bed to put feet on the floor. Patients sitting balance and standing balance is good. She is able to perform sit <> stand with assist x 2 for lines and safety with HHA. She takes one step towards recliner with HHA and min assist. She reports no pain and no dizziness. Patient is left in recliner chair with feet up and NSG in room. She will continue to benefit from skilled PT to improve mobility and strength.     Follow Up Recommendations SNF    Equipment Recommendations  Rolling walker with 5" wheels    Recommendations for Other Services       Precautions / Restrictions Restrictions Weight Bearing Restrictions: No      Mobility  Bed Mobility Overal bed mobility: Needs Assistance Bed Mobility: Supine to Sit;Sit to Supine     Supine to sit: Mod assist     General bed mobility comments: needs extra time  Transfers Overall transfer level: Needs assistance Equipment used: 1 person hand held assist;None Transfers: Sit to/from Stand Sit to Stand: Min assist         General transfer comment: fearful, cues for safety due to lines  Ambulation/Gait Ambulation/Gait assistance: Min assist Gait Distance (Feet): 1 Feet Assistive device: (step-to, Hand held assist) Gait Pattern/deviations: (extra time due  to lines)        Stairs            Wheelchair Mobility    Modified Rankin (Stroke Patients Only)       Balance Overall balance assessment: Modified Independent                                           Pertinent Vitals/Pain Pain Assessment: No/denies pain    Home Living Family/patient expects to be discharged to:: Unsure                      Prior Function Level of Independence: Independent               Hand Dominance        Extremity/Trunk Assessment   Upper Extremity Assessment Upper Extremity Assessment: Generalized weakness    Lower Extremity Assessment Lower Extremity Assessment: Generalized weakness       Communication   Communication: No difficulties  Cognition Arousal/Alertness: Awake/alert Behavior During Therapy: WFL for tasks assessed/performed Overall Cognitive Status: Within Functional Limits for tasks assessed                                 General Comments: fearful , better with education and encouragement      General Comments  Exercises     Assessment/Plan    PT Assessment Patient needs continued PT services  PT Problem List Decreased strength;Decreased activity tolerance;Decreased mobility;Obesity       PT Treatment Interventions Gait training;Therapeutic activities;Therapeutic exercise    PT Goals (Current goals can be found in the Care Plan section)  Acute Rehab PT Goals Patient Stated Goal: to get stronger PT Goal Formulation: Patient unable to participate in goal setting Time For Goal Achievement: 04/12/20 Potential to Achieve Goals: Good    Frequency Min 2X/week   Barriers to discharge        Co-evaluation               AM-PAC PT "6 Clicks" Mobility  Outcome Measure Help needed turning from your back to your side while in a flat bed without using bedrails?: A Little Help needed moving from lying on your back to sitting on the side of a flat bed  without using bedrails?: A Lot Help needed moving to and from a bed to a chair (including a wheelchair)?: A Lot Help needed standing up from a chair using your arms (e.g., wheelchair or bedside chair)?: A Little Help needed to walk in hospital room?: A Little Help needed climbing 3-5 steps with a railing? : A Lot 6 Click Score: 15    End of Session Equipment Utilized During Treatment: Gait belt;Oxygen Activity Tolerance: Patient limited by fatigue Patient left: in chair;with nursing/sitter in room Nurse Communication: Mobility status PT Visit Diagnosis: Muscle weakness (generalized) (M62.81);Difficulty in walking, not elsewhere classified (R26.2)    Time: 1448-1856 PT Time Calculation (min) (ACUTE ONLY): 38 min   Charges:   PT Evaluation $PT Eval Low Complexity: 1 Low PT Treatments $Therapeutic Activity: 23-37 mins          Ezekiel Ina, PT DPT 03/29/2020, 2:53 PM

## 2020-03-29 NOTE — Progress Notes (Signed)
Assisted tele visit to patient with family member.  Diane Keller M, RN  

## 2020-03-29 NOTE — Progress Notes (Signed)
Patient remains responsive to voice and she opens her eyes spontaneously, however she is still lethargic.  Her HR is NSR and her B/P are WDL.  She is still on HFNC 14L as her O2 saturations maintain around mid 90's. She has significant ronchi in both upper lobe fields. She has consumed very little of her breakfast.  Her urine output is WDL as she has had about Approx since beginning of shift.  She has received remdesivir and 2gm magnesium via IV infusion. She still has weak movement of her extremities and she makes little effort to move around in bed.  Spoke with husband on the phone earlier in the shift and explained the course of therapy and patient goals.

## 2020-03-30 ENCOUNTER — Inpatient Hospital Stay: Payer: Medicaid Other

## 2020-03-30 ENCOUNTER — Inpatient Hospital Stay: Payer: Self-pay

## 2020-03-30 LAB — TYPE AND SCREEN
ABO/RH(D): B POS
Antibody Screen: NEGATIVE
Unit division: 0
Unit division: 0
Unit division: 0

## 2020-03-30 LAB — COMPREHENSIVE METABOLIC PANEL
ALT: 29 U/L (ref 0–44)
AST: 60 U/L — ABNORMAL HIGH (ref 15–41)
Albumin: 2.6 g/dL — ABNORMAL LOW (ref 3.5–5.0)
Alkaline Phosphatase: 58 U/L (ref 38–126)
Anion gap: 15 (ref 5–15)
BUN: 76 mg/dL — ABNORMAL HIGH (ref 6–20)
CO2: 32 mmol/L (ref 22–32)
Calcium: 8.3 mg/dL — ABNORMAL LOW (ref 8.9–10.3)
Chloride: 101 mmol/L (ref 98–111)
Creatinine, Ser: 2.86 mg/dL — ABNORMAL HIGH (ref 0.44–1.00)
GFR calc Af Amer: 22 mL/min — ABNORMAL LOW (ref >60)
GFR calc non Af Amer: 19 mL/min — ABNORMAL LOW (ref >60)
Glucose, Bld: 131 mg/dL — ABNORMAL HIGH (ref 70–99)
Potassium: 3.4 mmol/L — ABNORMAL LOW (ref 3.5–5.1)
Sodium: 148 mmol/L — ABNORMAL HIGH (ref 135–145)
Total Bilirubin: 0.4 mg/dL (ref 0.3–1.2)
Total Protein: 6.7 g/dL (ref 6.5–8.1)

## 2020-03-30 LAB — PROTEIN ELECTROPHORESIS, SERUM
A/G Ratio: 0.7 (ref 0.7–1.7)
Albumin ELP: 2.7 g/dL — ABNORMAL LOW (ref 2.9–4.4)
Alpha-1-Globulin: 0.4 g/dL (ref 0.0–0.4)
Alpha-2-Globulin: 1 g/dL (ref 0.4–1.0)
Beta Globulin: 1.1 g/dL (ref 0.7–1.3)
Gamma Globulin: 1.3 g/dL (ref 0.4–1.8)
Globulin, Total: 3.8 g/dL (ref 2.2–3.9)
Total Protein ELP: 6.5 g/dL (ref 6.0–8.5)

## 2020-03-30 LAB — BLOOD GAS, ARTERIAL
Acid-Base Excess: 13.3 mmol/L — ABNORMAL HIGH (ref 0.0–2.0)
Bicarbonate: 38.3 mmol/L — ABNORMAL HIGH (ref 20.0–28.0)
FIO2: 0.85
O2 Saturation: 96.3 %
Patient temperature: 37
pCO2 arterial: 48 mmHg (ref 32.0–48.0)
pH, Arterial: 7.51 — ABNORMAL HIGH (ref 7.350–7.450)
pO2, Arterial: 76 mmHg — ABNORMAL LOW (ref 83.0–108.0)

## 2020-03-30 LAB — BPAM RBC
Blood Product Expiration Date: 202105242359
Blood Product Expiration Date: 202106052359
Blood Product Expiration Date: 202106052359
ISSUE DATE / TIME: 202105151401
Unit Type and Rh: 1700
Unit Type and Rh: 7300
Unit Type and Rh: 7300

## 2020-03-30 LAB — CBC
HCT: 26.5 % — ABNORMAL LOW (ref 36.0–46.0)
Hemoglobin: 8.3 g/dL — ABNORMAL LOW (ref 12.0–15.0)
MCH: 20.3 pg — ABNORMAL LOW (ref 26.0–34.0)
MCHC: 31.3 g/dL (ref 30.0–36.0)
MCV: 64.8 fL — ABNORMAL LOW (ref 80.0–100.0)
Platelets: 732 10*3/uL — ABNORMAL HIGH (ref 150–400)
RBC: 4.09 MIL/uL (ref 3.87–5.11)
RDW: 22.5 % — ABNORMAL HIGH (ref 11.5–15.5)
WBC: 10.2 10*3/uL (ref 4.0–10.5)
nRBC: 0.4 % — ABNORMAL HIGH (ref 0.0–0.2)

## 2020-03-30 LAB — FIBRIN DERIVATIVES D-DIMER (ARMC ONLY): Fibrin derivatives D-dimer (ARMC): 1304.43 ng/mL (FEU) — ABNORMAL HIGH (ref 0.00–499.00)

## 2020-03-30 LAB — MAGNESIUM: Magnesium: 2.2 mg/dL (ref 1.7–2.4)

## 2020-03-30 LAB — C-REACTIVE PROTEIN: CRP: 2.5 mg/dL — ABNORMAL HIGH (ref <1.0)

## 2020-03-30 LAB — GLUCOSE, CAPILLARY: Glucose-Capillary: 114 mg/dL — ABNORMAL HIGH (ref 70–99)

## 2020-03-30 MED ORDER — ENOXAPARIN SODIUM 40 MG/0.4ML ~~LOC~~ SOLN
40.0000 mg | SUBCUTANEOUS | Status: DC
  Administered 2020-03-30 – 2020-04-01 (×3): 40 mg via SUBCUTANEOUS
  Filled 2020-03-30 (×3): qty 0.4

## 2020-03-30 MED ORDER — B COMPLEX-C PO TABS
1.0000 | ORAL_TABLET | Freq: Every day | ORAL | Status: DC
  Administered 2020-03-31 – 2020-04-16 (×17): 1 via ORAL
  Filled 2020-03-30 (×18): qty 1

## 2020-03-30 MED ORDER — ASCORBIC ACID 500 MG PO TABS
500.0000 mg | ORAL_TABLET | Freq: Two times a day (BID) | ORAL | Status: DC
  Administered 2020-03-31: 500 mg via ORAL
  Filled 2020-03-30: qty 1

## 2020-03-30 MED ORDER — SODIUM CHLORIDE 0.9% FLUSH: 10.0000 mL | INTRAVENOUS | Status: DC | PRN

## 2020-03-30 MED ORDER — IVERMECTIN 3 MG PO TABS
150.0000 ug/kg | ORAL_TABLET | Freq: Every day | ORAL | Status: DC
  Filled 2020-03-30: qty 6

## 2020-03-30 MED ORDER — THIAMINE HCL 100 MG PO TABS
200.0000 mg | ORAL_TABLET | Freq: Two times a day (BID) | ORAL | Status: DC
  Administered 2020-03-30 – 2020-03-31 (×2): 200 mg via ORAL
  Filled 2020-03-30 (×2): qty 2

## 2020-03-30 MED ORDER — CEFAZOLIN SODIUM-DEXTROSE 1-4 GM/50ML-% IV SOLN
1.0000 g | Freq: Three times a day (TID) | INTRAVENOUS | Status: AC
  Administered 2020-03-30 – 2020-04-02 (×8): 1 g via INTRAVENOUS
  Filled 2020-03-30 (×9): qty 50

## 2020-03-30 MED ORDER — POTASSIUM CHLORIDE CRYS ER 20 MEQ PO TBCR: 20.0000 meq | EXTENDED_RELEASE_TABLET | Freq: Once | ORAL | Status: DC

## 2020-03-30 MED ORDER — IVERMECTIN 3 MG PO TABS
150.0000 ug/kg | ORAL_TABLET | Freq: Every day | ORAL | Status: AC
  Administered 2020-03-30 – 2020-04-03 (×5): 18000 ug via ORAL
  Filled 2020-03-30 (×5): qty 6

## 2020-03-30 MED ORDER — POTASSIUM CHLORIDE CRYS ER 20 MEQ PO TBCR
20.0000 meq | EXTENDED_RELEASE_TABLET | Freq: Once | ORAL | Status: AC
  Administered 2020-03-30: 20 meq via ORAL
  Filled 2020-03-30: qty 1

## 2020-03-30 MED ORDER — SODIUM CHLORIDE 0.9% FLUSH
10.0000 mL | Freq: Two times a day (BID) | INTRAVENOUS | Status: DC
  Administered 2020-03-30: 40 mL
  Administered 2020-03-31 – 2020-04-03 (×7): 10 mL
  Administered 2020-04-03: 20 mL
  Administered 2020-04-04: 30 mL
  Administered 2020-04-04: 10 mL
  Administered 2020-04-05: 30 mL
  Administered 2020-04-06 – 2020-04-07 (×3): 10 mL
  Administered 2020-04-07: 20 mL
  Administered 2020-04-08: 10 mL
  Administered 2020-04-09: 20 mL
  Administered 2020-04-09: 10 mL
  Administered 2020-04-10: 20:00:00 30 mL
  Administered 2020-04-10: 10:00:00 10 mL
  Administered 2020-04-11: 21:00:00 30 mL
  Administered 2020-04-11: 10:00:00 10 mL
  Administered 2020-04-12: 30 mL
  Administered 2020-04-12 – 2020-04-16 (×8): 10 mL

## 2020-03-30 MED ORDER — CEFAZOLIN SODIUM-DEXTROSE 1-4 GM/50ML-% IV SOLN
1.0000 g | Freq: Three times a day (TID) | INTRAVENOUS | Status: DC
  Filled 2020-03-30 (×4): qty 50

## 2020-03-30 NOTE — Progress Notes (Signed)
PICC ordered received.  Spoke with Leotis Shames, RN regarding.  Patient is having mental status changes and will need to have her husband sign consent.  Made aware that it will be several hours before PICC nurses can arrive and if patient becomes unstable, MD will need to place CL if central access is needed.

## 2020-03-30 NOTE — Progress Notes (Signed)
CRITICAL CARE NOTE  5/14 admitted for COVID pneumonia, anemia and Renal failure 5/14 8L [place], worsening renal function 5/15 very good UOP, 1500 cc's 5/16 good UOP 1200CC's 5/17- patient had episode of respiratory distress with hypoxemia, we discussed this with daughter, high risk for intubation  CC  follow up respiratory failure Renal failure  SUBJECTIVE Prognosis is guarded 11 L [place] No SOB, resting comfortable Feels better creatinine improving H/H stable   BP (!) 171/98 (BP Location: Right Arm)   Pulse 72   Temp 98.1 F (36.7 C) (Oral)   Resp (!) 25   Ht 5\' 6"  (1.676 m)   Wt 119.7 kg   SpO2 92%   BMI 42.59 kg/m    I/O last 3 completed shifts: In: 4117 [P.O.:480; I.V.:2911.8; IV Piggyback:725.2] Out: 2495 [Urine:2495] No intake/output data recorded.  SpO2: 92 % O2 Flow Rate (L/min): 50 L/min FiO2 (%): 100 %  Estimated body mass index is 42.59 kg/m as calculated from the following:   Height as of this encounter: 5\' 6"  (1.676 m).   Weight as of this encounter: 119.7 kg.   Review of Systems:  Gen:  Denies  fever, sweats, chills weight loss  HEENT: Denies blurred vision, double vision, ear pain, eye pain, hearing loss, nose bleeds, sore throat Cardiac:  No dizziness, chest pain or heaviness, chest tightness,edema, No JVD Resp:   No cough, -sputum production, -shortness of breath,-wheezing, -hemoptysis,  Gi: Denies swallowing difficulty, stomach pain, nausea or vomiting, diarrhea, constipation, bowel incontinence Gu:  Denies bladder incontinence, burning urine Ext:   Denies Joint pain, stiffness or swelling Skin: Denies  skin rash, easy bruising or bleeding or hives Endoc:  Denies polyuria, polydipsia , polyphagia or weight change Psych:   Denies depression, insomnia or hallucinations  Other:  All other systems negative   Physical Examination:   General Appearance: No distress  Neuro:without focal findings,  speech normal,  HEENT: PERRLA, EOM intact.    Pulmonary: normal breath sounds, No wheezing.  CardiovascularNormal S1,S2.  No m/r/g.   Abdomen: Benign, Soft, non-tender. Renal:  No costovertebral tenderness  GU:  Not performed at this time. Endoc: No evident thyromegaly Skin:   warm, no rashes, no ecchymosis  Extremities: normal, no cyanosis, clubbing. PSYCHIATRIC: Mood, affect within normal limits.   ALL OTHER ROS ARE NEGATIVE   MEDICATIONS: I have reviewed all medications and confirmed regimen as documented   CULTURE RESULTS   Recent Results (from the past 240 hour(s))  Culture, blood (Routine x 2)     Status: None (Preliminary result)   Collection Time: 03/26/20 12:47 PM   Specimen: BLOOD  Result Value Ref Range Status   Specimen Description BLOOD BLOOD LEFT HAND  Final   Special Requests   Final    BOTTLES DRAWN AEROBIC AND ANAEROBIC Blood Culture adequate volume   Culture   Final    NO GROWTH 4 DAYS Performed at [facility], [address], [place], [address] [place]    Report Status PENDING  Incomplete  Culture, blood (Routine x 2)     Status: None (Preliminary result)   Collection Time: 03/26/20 12:47 PM   Specimen: BLOOD  Result Value Ref Range Status   Specimen Description BLOOD LEFT ANTECUBITAL  Final   Special Requests   Final    BOTTLES DRAWN AEROBIC AND ANAEROBIC Blood Culture adequate volume   Culture   Final    NO GROWTH 4 DAYS Performed at [facility], [address], [place], [address] [place]  Report Status PENDING  Incomplete  Urine Culture     Status: Abnormal   Collection Time: 03/26/20  1:55 PM   Specimen: Urine, Random  Result Value Ref Range Status   Specimen Description   Final    URINE, RANDOM Performed at [facility], [address] [place], [place] [address]    Special Requests   Final    NONE Performed at [facility], [place], [place] [address]    Culture >=100,000 COLONIES/mL ESCHERICHIA COLI (A)  Final   Report  Status 03/29/2020 FINAL  Final   Organism ID, Bacteria ESCHERICHIA COLI (A)  Final      Susceptibility   Escherichia coli - MIC*    AMPICILLIN <=2 SENSITIVE Sensitive     CEFAZOLIN <=4 SENSITIVE Sensitive     CEFTRIAXONE <=1 SENSITIVE Sensitive     CIPROFLOXACIN <=0.25 SENSITIVE Sensitive     GENTAMICIN <=1 SENSITIVE Sensitive     IMIPENEM <=0.25 SENSITIVE Sensitive     NITROFURANTOIN <=16 SENSITIVE Sensitive     TRIMETH/SULFA <=20 SENSITIVE Sensitive     AMPICILLIN/SULBACTAM <=2 SENSITIVE Sensitive     PIP/TAZO <=4 SENSITIVE Sensitive     * >=100,000 COLONIES/mL ESCHERICHIA COLI  SARS Coronavirus 2 by RT PCR (hospital order, performed in [place] hospital lab) Nasopharyngeal Nasopharyngeal Swab     Status: Abnormal   Collection Time: 03/26/20  2:47 PM   Specimen: Nasopharyngeal Swab  Result Value Ref Range Status   SARS Coronavirus 2 POSITIVE (A) NEGATIVE Final    Comment: RESULT CALLED TO, READ BACK BY AND VERIFIED WITH: CADY,R AT 1608 ON 03/26/2020 BY MOSLEY,J (NOTE) SARS-CoV-2 target nucleic acids are DETECTED SARS-CoV-2 RNA is generally detectable in upper respiratory specimens  during the acute phase of infection.  Positive results are indicative  of the presence of the identified virus, but do not rule out bacterial infection or co-infection with other pathogens not detected by the test.  Clinical correlation with patient history and  other diagnostic information is necessary to determine patient infection status.  The expected result is negative. Fact Sheet for Patients:   StrictlyIdeas.no  Fact Sheet for Healthcare Providers:   BankingDealers.co.za   This test is not yet approved or cleared by the [place] FDA and  has been authorized for detection and/or diagnosis of SARS-CoV-2 by FDA under an Emergency Use Authorization (EUA).  This EUA will remain in effect (meaning this test  can be used) for the duration of  the  COVID-19 declaration under Section 564(b)(1) of the Act, 21 U.S.C. section 360-bbb-3(b)(1), unless the authorization is terminated or revoked sooner. Performed at [facility], [address] [place], [place] [address]   MRSA PCR Screening     Status: None   Collection Time: 03/26/20  9:56 PM   Specimen: Nasopharyngeal  Result Value Ref Range Status   MRSA by PCR NEGATIVE NEGATIVE Final    Comment:        The GeneXpert MRSA Assay (FDA approved for NASAL specimens only), is one component of a comprehensive MRSA colonization surveillance program. It is not intended to diagnose MRSA infection nor to guide or monitor treatment for MRSA infections. Performed at [facility], [address], [place], [place] [address]           IMAGING    DG Chest Port 1 View  Result Date: 03/30/2020 CLINICAL DATA:  Patient with vomiting. Increased oxygen requirement. EXAM: PORTABLE CHEST 1 VIEW COMPARISON:  Chest  radiograph 03/30/2020. FINDINGS: Monitoring leads overlie the patient. Stable cardiomegaly. Similar-appearing bilateral patchy areas of consolidation. Elevation right hemidiaphragm. Probable small left pleural effusion. No pneumothorax. IMPRESSION: Similar-appearing bilateral airspace opacities. Probable small left pleural effusion. Electronically Signed   By: Annia Belt M.D.   On: 03/30/2020 14:37   DG Chest Port 1 View  Result Date: 03/30/2020 CLINICAL DATA:  Shortness of breath EXAM: PORTABLE CHEST 1 VIEW COMPARISON:  03/27/2020 FINDINGS: Persistent bilateral opacities. Slightly decreased left mid lung consolidation. There is otherwise overall similar lung aeration. No significant pleural effusion. No pneumothorax. Stable cardiomediastinal contours. IMPRESSION: Persistent bilateral opacities with slightly decreased left mid lung consolidation. Otherwise similar lung aeration compared to 03/27/2020. Electronically Signed   By: Guadlupe Spanish M.D.   On: 03/30/2020 09:05    [place] EKG SITE RITE  Result Date: 03/30/2020 If Site Rite image not attached, placement could not be confirmed due to current cardiac rhythm.    Nutrition Status: Nutrition Problem: Increased nutrient needs Etiology: catabolic illness(COVID-19) Signs/Symptoms: estimated needs Interventions: Ensure Enlive (each supplement provides 350kcal and 20 grams of protein), Magic cup     ASSESSMENT AND PLAN   COVID-19 pneumonia infection-pneumonia/pneumonitis IV steroids  IV remdisivir Aggressive pulm toilet recommended OOB to chair as tolerated  Pulmonary hygiene Continue proning as tolerated due to severe hypoxia   Maintain airborne and contact precautions  As needed bronchodilators (MDI) Vitamin C and zinc Antitussives   Morbid obesity, possible OSA.   Will certainly impact respiratory mechanics   ACUTE KIDNEY INJURY/Renal Failure -continue Foley Catheter-assess need -Avoid nephrotoxic agents -Follow urine output, BMP -Ensure adequate renal perfusion, optimize oxygenation -Renal dose medications  CARDIAC ICU monitoring  ID -continue IV abx as prescibed -follow up cultures  GI GI PROPHYLAXIS as indicated  NUTRITIONAL STATUS Nutrition Status: Nutrition Problem: Increased nutrient needs Etiology: catabolic illness(COVID-19) Signs/Symptoms: estimated needs Interventions: Ensure Enlive (each supplement provides 350kcal and 20 grams of protein), Magic cup   DIET-->as tolerated Constipation protocol as indicated  ENDO - will use ICU hypoglycemic\Hyperglycemia protocol if indicated     ELECTROLYTES -follow labs as needed -replace as needed -pharmacy consultation and following   DVT/GI PRX ordered and assessed TRANSFUSIONS AS NEEDED MONITOR FSBS  CASE DISCUSSED IN MULTIDISCIPLINARY ROUNDS WITH ICU TEAM  Critical care provider statement:    Critical care time (minutes):  33   Critical care time was exclusive of:  Separately billable procedures and   treating other patients   Critical care was necessary to treat or prevent imminent or  life-threatening deterioration of the following conditions:  acute hypoxemic respiratory failure due to [phone]    Critical care was time spent personally by me on the following  activities:  Development of treatment plan with patient or surrogate,  discussions with consultants, evaluation of patient's response to  treatment, examination of patient, obtaining history from patient or  surrogate, ordering and performing treatments and interventions, ordering  and review of laboratory studies and re-evaluation of patient's condition   I assumed direction of critical care for this patient from another  provider in my specialty: no      Vida Rigger, M.D.  Pulmonary & Critical Care Medicine  [facility]

## 2020-03-30 NOTE — Progress Notes (Signed)
[facility]  ROUNDING NOTE   Subjective:   No complaints.   UOP 1612mL.   Creatinine 2.86 (3.92)  Objective:  Vital signs in last 24 hours:  Temp:  [97.7 F (36.5 C)-98.3 F (36.8 C)] 98.1 F (36.7 C) (05/17 0400) Pulse Rate:  [66-85] 76 (05/17 0600) Resp:  [12-34] 25 (05/17 0600) BP: (120-181)/(77-105) 170/104 (05/17 0600) SpO2:  [86 %-98 %] 98 % (05/17 0600) Weight:  [119.7 kg] 119.7 kg (05/17 0411)  Weight change:  Filed Weights   03/26/20 1244 03/30/20 0411  Weight: 116.3 kg 119.7 kg    Intake/Output: I/O last 3 completed shifts: In: 4117 [P.O.:480; I.V.:2911.8; IV Piggyback:725.2] Out: 2495 [Urine:2495]   Intake/Output this shift:  No intake/output data recorded.  Physical Exam: General: ill-appearing  Head: HFNC  Eyes: Anicteric  Neck: trachea midline  Lungs:  clear  Heart: regular  Extremities: No edema  Neuro: Awake, alert, conversant  Access:   None          Basic Metabolic Panel: Recent Labs  Lab 03/26/20 1659 03/26/20 1659 03/27/20 0538 03/27/20 0538 03/28/20 0420 03/29/20 0514 03/30/20 0423  NA 140  --  143  --  143 144 148*  K 3.8  --  4.0  --  3.5 4.4 3.4*  CL 113*  --  113*  --  107 104 101  CO2 14*  --  18*  --  22 26 32  GLUCOSE 105*  --  119*  --  140* 140* 131*  BUN 69*  --  77*  --  84* 83* 76*  CREATININE 7.10*  --  6.52*  --  5.37* 3.92* 2.86*  CALCIUM 7.6*   < > 7.9*   < > 7.8* 7.7* 8.3*  MG  --   --   --   --   --  1.6* 2.2  PHOS  --   --   --   --   --  4.0  --    < > = values in this interval not displayed.    Liver Function Tests: Recent Labs  Lab 03/26/20 1659 03/27/20 0538 03/28/20 0420 03/29/20 0514 03/30/20 0423  AST 86* 94* 67* 57* 60*  ALT 28 33 29 26 29   ALKPHOS 56 61 56 54 58  BILITOT 0.5 0.5 0.4 0.7 0.4  PROT 6.8 7.0 6.7 6.4* 6.7  ALBUMIN 2.8* 2.8* 2.5* 2.4* 2.6*   No results for input(s): LIPASE, AMYLASE in the last 168 hours. No results for input(s): AMMONIA in the last 168  hours.  CBC: Recent Labs  Lab 03/26/20 1247 03/26/20 1247 03/27/20 0538 03/28/20 0420 03/28/20 1909 03/29/20 0514 03/30/20 0423  WBC 6.2  --  6.3 7.1  --  9.2 10.2  NEUTROABS 4.6  --   --   --   --   --   --   HGB 6.9*   < > 6.4* 6.2* 7.4* 8.0* 8.3*  HCT 23.0*   < > 21.2* 19.9* 23.5* 24.7* 26.5*  MCV 65.0*  --  65.0* 61.2*  --  63.7* 64.8*  PLT 473*  --  494* 639*  --  610* 732*   < > = values in this interval not displayed.    Cardiac Enzymes: No results for input(s): CKTOTAL, CKMB, CKMBINDEX, TROPONINI in the last 168 hours.  BNP: Invalid input(s): POCBNP  CBG: Recent Labs  Lab 03/26/20 1617 03/28/20 0851 03/28/20 1620 03/29/20 1145  GLUCAP 94 145* 170* 143*    Microbiology: Results  for orders placed or performed during the hospital encounter of 03/26/20  Culture, blood (Routine x 2)     Status: None (Preliminary result)   Collection Time: 03/26/20 12:47 PM   Specimen: BLOOD  Result Value Ref Range Status   Specimen Description BLOOD BLOOD LEFT HAND  Final   Special Requests   Final    BOTTLES DRAWN AEROBIC AND ANAEROBIC Blood Culture adequate volume   Culture   Final    NO GROWTH 4 DAYS Performed at [facility], [address], [place], [place] [address]    Report Status PENDING  Incomplete  Culture, blood (Routine x 2)     Status: None (Preliminary result)   Collection Time: 03/26/20 12:47 PM   Specimen: BLOOD  Result Value Ref Range Status   Specimen Description BLOOD LEFT ANTECUBITAL  Final   Special Requests   Final    BOTTLES DRAWN AEROBIC AND ANAEROBIC Blood Culture adequate volume   Culture   Final    NO GROWTH 4 DAYS Performed at [facility], [address], [place], [place] [address]    Report Status PENDING  Incomplete  Urine Culture     Status: Abnormal   Collection Time: 03/26/20  1:55 PM   Specimen: Urine, Random  Result Value Ref Range Status   Specimen Description   Final    URINE, RANDOM Performed at  [facility], [address] [place], [place] [address]    Special Requests   Final    NONE Performed at [facility], [address] [place], [place] [address]    Culture >=100,000 COLONIES/mL ESCHERICHIA COLI (A)  Final   Report Status 03/29/2020 FINAL  Final   Organism ID, Bacteria ESCHERICHIA COLI (A)  Final      Susceptibility   Escherichia coli - MIC*    AMPICILLIN <=2 SENSITIVE Sensitive     CEFAZOLIN <=4 SENSITIVE Sensitive     CEFTRIAXONE <=1 SENSITIVE Sensitive     CIPROFLOXACIN <=0.25 SENSITIVE Sensitive     GENTAMICIN <=1 SENSITIVE Sensitive     IMIPENEM <=0.25 SENSITIVE Sensitive     NITROFURANTOIN <=16 SENSITIVE Sensitive     TRIMETH/SULFA <=20 SENSITIVE Sensitive     AMPICILLIN/SULBACTAM <=2 SENSITIVE Sensitive     PIP/TAZO <=4 SENSITIVE Sensitive     * >=100,000 COLONIES/mL ESCHERICHIA COLI  SARS Coronavirus 2 by RT PCR (hospital order, performed in [facility] hospital lab) Nasopharyngeal Nasopharyngeal Swab     Status: Abnormal   Collection Time: 03/26/20  2:47 PM   Specimen: Nasopharyngeal Swab  Result Value Ref Range Status   SARS Coronavirus 2 POSITIVE (A) NEGATIVE Final    Comment: RESULT CALLED TO, READ BACK BY AND VERIFIED WITH: CADY,R AT 1608 ON 03/26/2020 BY MOSLEY,J (NOTE) SARS-CoV-2 target nucleic acids are DETECTED SARS-CoV-2 RNA is generally detectable in upper respiratory specimens  during the acute phase of infection.  Positive results are indicative  of the presence of the identified virus, but do not rule out bacterial infection or co-infection with other pathogens not detected by the test.  Clinical correlation with patient history and  other diagnostic information is necessary to determine patient infection status.  The expected result is negative. Fact Sheet for Patients:   BoilerBrush.com.cy  Fact Sheet for Healthcare Providers:   https://pope.com/   This test is not yet  approved or cleared by the [place] FDA and  has been authorized for detection and/or diagnosis of SARS-CoV-2 by FDA under an Emergency Use  Authorization (EUA).  This EUA will remain in effect (meaning this test  can be used) for the duration of  the COVID-19 declaration under Section 564(b)(1) of the Act, 21 U.S.C. section 360-bbb-3(b)(1), unless the authorization is terminated or revoked sooner. Performed at [facility], [address] [place], [place] [address]   MRSA PCR Screening     Status: None   Collection Time: 03/26/20  9:56 PM   Specimen: Nasopharyngeal  Result Value Ref Range Status   MRSA by PCR NEGATIVE NEGATIVE Final    Comment:        The GeneXpert MRSA Assay (FDA approved for NASAL specimens only), is one component of a comprehensive MRSA colonization surveillance program. It is not intended to diagnose MRSA infection nor to guide or monitor treatment for MRSA infections. Performed at [facility], [address] [place], [place] [address]     Coagulation Studies: No results for input(s): LABPROT, INR in the last 72 hours.  Urinalysis: No results for input(s): COLORURINE, LABSPEC, PHURINE, GLUCOSEU, HGBUR, BILIRUBINUR, KETONESUR, PROTEINUR, UROBILINOGEN, NITRITE, LEUKOCYTESUR in the last 72 hours.  Invalid input(s): APPERANCEUR    Imaging: DG Chest Port 1 View  Result Date: 03/30/2020 CLINICAL DATA:  Shortness of breath EXAM: PORTABLE CHEST 1 VIEW COMPARISON:  03/27/2020 FINDINGS: Persistent bilateral opacities. Slightly decreased left mid lung consolidation. There is otherwise overall similar lung aeration. No significant pleural effusion. No pneumothorax. Stable cardiomediastinal contours. IMPRESSION: Persistent bilateral opacities with slightly decreased left mid lung consolidation. Otherwise similar lung aeration compared to 03/27/2020. Electronically Signed   By: Guadlupe Spanish M.D.   On: 03/30/2020 09:05     Medications:    . sodium chloride    . remdesivir 100 mg in NS 100 mL Stopped (03/29/20 1025)  .  sodium bicarbonate (isotonic) infusion in sterile water 100 mL/hr at 03/30/20 0413   . sodium chloride   Intravenous Once  . aerochamber plus with mask  1 each Other Once  . albuterol  1 puff Inhalation Q4H  . vitamin C  500 mg Oral Daily  . buprenorphine-naloxone  1 tablet Sublingual Daily  . Chlorhexidine Gluconate Cloth  6 each Topical Daily  . ciclesonide  4 puff Inhalation BID  . dexamethasone (DECADRON) injection  6 mg Intravenous Daily  . feeding supplement (ENSURE ENLIVE)  237 mL Oral TID BM  . heparin  5,000 Units Subcutaneous Q8H  . ipratropium  2 puff Inhalation Q4H  . labetalol  100 mg Oral BID  . melatonin  10 mg Oral QHS  . pantoprazole  40 mg Oral QHS  . potassium chloride  20 mEq Oral Once  . sodium chloride flush  3 mL Intravenous Q12H  . zinc sulfate  220 mg Oral Daily   sodium chloride, acetaminophen, diphenhydrAMINE, docusate sodium, lip balm, ondansetron (ZOFRAN) IV, polyethylene glycol, sodium chloride flush  Assessment/ Plan:   Diane Keller is a 44 y.o. black female with history of anxiety, GERD, prior history of CVA who presents with COVID-19 infection and acute kidney injury.  1.  Acute kidney injury secondary to acute tubular necrosis.Baseline creatinine of 0.87 in 2018.  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 Nonoliguric urine output Discontinue IVF - encourage PO intake.   2. Metabolic acidosis: improved on sodium bicarbonate infusion  3. Hypernatremia and hypokalemia: secondary to sodium bicarbonate infusion.   3. Anemia with renal failure: hemoglobin 8.3 - status post PRBC transfusion on  5/15.   4. Thrombocytosis: with elevated D-dimer. Lower extremity dopplers are negative.   5. COVID-19 pneumonia on remdesivir, azithro, ceftriaxone, and dexamethazone. Requiring  HFNC Appreciate pulmonary input.   6. Urinary Tract Infection: E. Coli - treated with ceftriaxone.    LOS: 4 Sarath Kolluru 5/17/20219:20 AM

## 2020-03-30 NOTE — Progress Notes (Signed)
Peripherally Inserted Central Catheter Placement  The IV Nurse has discussed with the patient and/or persons authorized to consent for the patient, the purpose of this procedure and the potential benefits and risks involved with this procedure.  The benefits include less needle sticks, lab draws from the catheter, and the patient may be discharged home with the catheter. Risks include, but not limited to, infection, bleeding, blood clot (thrombus formation), and puncture of an artery; nerve damage and irregular heartbeat and possibility to perform a PICC exchange if needed/ordered by physician.  Alternatives to this procedure were also discussed.  Bard Power PICC patient education guide, fact sheet on infection prevention and patient information card has been provided to patient /or left at bedside.  Telephone consent obtained from husband due to periods of confusion.  PICC Placement Documentation  PICC Triple Lumen 03/30/20 PICC Right Basilic 38 cm 0 cm (Active)  Indication for Insertion or Continuance of Line Poor Vasculature-patient has had multiple peripheral attempts or PIVs lasting less than 24 hours;Prolonged intravenous therapies 03/30/20 1821  Exposed Catheter (cm) 0 cm 03/30/20 1821  Site Assessment Clean;Dry;Intact 03/30/20 1821  Lumen #1 Status Flushed;Saline locked;Blood return noted 03/30/20 1821  Lumen #2 Status Flushed;Saline locked;Blood return noted 03/30/20 1821  Lumen #3 Status Infusing 03/30/20 1821  Dressing Type Transparent 03/30/20 1821  Dressing Status Clean;Dry;Intact 03/30/20 1821  Dressing Intervention New dressing 03/30/20 1821  Dressing Change Due 04/06/20 03/30/20 1821       Maelani Yarbro, Lajean Manes 03/30/2020, 6:23 PM

## 2020-03-30 NOTE — Progress Notes (Signed)
Patient vomiting lunch on self and bed, uncontrollably.  No blood noted.  Patient was disoriented to place and time, but reoriented by this RN after 10-15 minutes.  Patient remained NPO while nauseous, however, it was noted by this RN that 20g LAC was compromised and patient was requiring more O2 after vomiting.  This RN ordered a stat chest xray, and paged respiratory, respiratory to bedside and placed patient on HHFNC at 50L and 100% FiO2.  Patient O2 saturation now 96%.  Patient appears to be resting comfortably.  Due to patient's declining respiratory status, and dx of COVID, this RN consulted MD for possible PICC line placement.  MD gave order for PICC line placement, and Nephrology cleared patient for PICC as well.    Spouse updated by this RN on patient current condition, and will continue to monitor.

## 2020-03-30 NOTE — Progress Notes (Signed)
Chaplain honored request from family member to please go pray for patient. Chaplain offered prayer at patient door for healing healing and comfort and God;s grace.

## 2020-03-30 NOTE — Progress Notes (Signed)
Assisted tele visit to patient with husband.  Diane Barthel M Camreigh Michie, RN   

## 2020-03-30 NOTE — Progress Notes (Signed)
Pharmacy Electrolyte Monitoring Consult:  Pharmacy consulted to assist in monitoring and replacing electrolytes in this 44 y.o. female admitted on 03/26/2020 with COVID-19 infection, pneumonia/pneumonitis.   Labs:  Sodium (mmol/L)  Date Value  03/30/2020 148 (H)  05/11/2012 140   Potassium (mmol/L)  Date Value  03/30/2020 3.4 (L)  05/11/2012 3.2 (L)   Magnesium (mg/dL)  Date Value  80/01/4916 2.2   Phosphorus (mg/dL)  Date Value  91/50/5697 4.0   Calcium (mg/dL)  Date Value  94/80/1655 8.3 (L)   Calcium, Total (mg/dL)  Date Value  37/48/2707 9.2   Albumin (g/dL)  Date Value  86/75/4492 2.6 (L)  05/11/2012 3.7   Corrected Ca: 9.4 mg/dL  Assessment/Plan: 1. Electrolytes:   K 3.4, added KLOR-CON tab PO x1 dose   Na 148, sodium bicarb drip was dc'd   Follow AM labs and replace as indicated  2. Glucose: patient on dexamethasone 6mg  IV daily (day #5)  Glucose controlled   Continue to monitor   Pharmacy will continue to monitor and adjust per consult.    03/30/2020 11:49 AM

## 2020-03-30 NOTE — Progress Notes (Signed)
Patient is a candidate for PICC line.     Lamont Dowdy MD [facility]

## 2020-03-31 LAB — BLOOD GAS, ARTERIAL
Acid-Base Excess: 12.1 mmol/L — ABNORMAL HIGH (ref 0.0–2.0)
Allens test (pass/fail): POSITIVE — AB
Bicarbonate: 37.3 mmol/L — ABNORMAL HIGH (ref 20.0–28.0)
FIO2: 80
O2 Saturation: 96.4 %
Patient temperature: 37
pCO2 arterial: 49 mmHg — ABNORMAL HIGH (ref 32.0–48.0)
pH, Arterial: 7.49 — ABNORMAL HIGH (ref 7.350–7.450)
pO2, Arterial: 78 mmHg — ABNORMAL LOW (ref 83.0–108.0)

## 2020-03-31 LAB — CBC
HCT: 24 % — ABNORMAL LOW (ref 36.0–46.0)
Hemoglobin: 7.1 g/dL — ABNORMAL LOW (ref 12.0–15.0)
MCH: 20.5 pg — ABNORMAL LOW (ref 26.0–34.0)
MCHC: 29.6 g/dL — ABNORMAL LOW (ref 30.0–36.0)
MCV: 69.2 fL — ABNORMAL LOW (ref 80.0–100.0)
Platelets: 645 10*3/uL — ABNORMAL HIGH (ref 150–400)
RBC: 3.47 MIL/uL — ABNORMAL LOW (ref 3.87–5.11)
RDW: 21.8 % — ABNORMAL HIGH (ref 11.5–15.5)
WBC: 12.3 10*3/uL — ABNORMAL HIGH (ref 4.0–10.5)
nRBC: 0.3 % — ABNORMAL HIGH (ref 0.0–0.2)

## 2020-03-31 LAB — MAGNESIUM: Magnesium: 2.1 mg/dL (ref 1.7–2.4)

## 2020-03-31 LAB — URIC ACID: Uric Acid, Serum: 10.2 mg/dL — ABNORMAL HIGH (ref 2.5–7.1)

## 2020-03-31 LAB — COMPREHENSIVE METABOLIC PANEL
ALT: 24 U/L (ref 0–44)
AST: 52 U/L — ABNORMAL HIGH (ref 15–41)
Albumin: 2.4 g/dL — ABNORMAL LOW (ref 3.5–5.0)
Alkaline Phosphatase: 58 U/L (ref 38–126)
Anion gap: 11 (ref 5–15)
BUN: 60 mg/dL — ABNORMAL HIGH (ref 6–20)
CO2: 35 mmol/L — ABNORMAL HIGH (ref 22–32)
Calcium: 8.1 mg/dL — ABNORMAL LOW (ref 8.9–10.3)
Chloride: 103 mmol/L (ref 98–111)
Creatinine, Ser: 2.17 mg/dL — ABNORMAL HIGH (ref 0.44–1.00)
GFR calc Af Amer: 31 mL/min — ABNORMAL LOW (ref >60)
GFR calc non Af Amer: 26 mL/min — ABNORMAL LOW (ref >60)
Glucose, Bld: 117 mg/dL — ABNORMAL HIGH (ref 70–99)
Potassium: 3.3 mmol/L — ABNORMAL LOW (ref 3.5–5.1)
Sodium: 149 mmol/L — ABNORMAL HIGH (ref 135–145)
Total Bilirubin: 0.4 mg/dL (ref 0.3–1.2)
Total Protein: 6.1 g/dL — ABNORMAL LOW (ref 6.5–8.1)

## 2020-03-31 LAB — FIBRIN DERIVATIVES D-DIMER (ARMC ONLY): Fibrin derivatives D-dimer (ARMC): 1175.33 ng/mL (FEU) — ABNORMAL HIGH (ref 0.00–499.00)

## 2020-03-31 LAB — CULTURE, BLOOD (ROUTINE X 2)
Culture: NO GROWTH
Culture: NO GROWTH
Special Requests: ADEQUATE
Special Requests: ADEQUATE

## 2020-03-31 LAB — PREPARE RBC (CROSSMATCH)

## 2020-03-31 LAB — AMMONIA: Ammonia: 19 umol/L (ref 9–35)

## 2020-03-31 LAB — C-REACTIVE PROTEIN: CRP: 11.4 mg/dL — ABNORMAL HIGH (ref <1.0)

## 2020-03-31 MED ORDER — MIDAZOLAM 50MG/50ML (1MG/ML) PREMIX INFUSION: 0.5000 mg/h | INTRAVENOUS | Status: DC

## 2020-03-31 MED ORDER — POTASSIUM CHLORIDE CRYS ER 20 MEQ PO TBCR
40.0000 meq | EXTENDED_RELEASE_TABLET | Freq: Once | ORAL | Status: AC
  Administered 2020-03-31: 40 meq via ORAL
  Filled 2020-03-31: qty 2

## 2020-03-31 MED ORDER — DEXTROSE 5 % IV SOLN: INTRAVENOUS | Status: DC

## 2020-03-31 NOTE — Progress Notes (Signed)
Assisted tele visit to patient with husband.  Sixto Bowdish R, RN  

## 2020-03-31 NOTE — Progress Notes (Signed)
CRITICAL CARE NOTE  5/14 admitted for COVID pneumonia, anemia and Renal failure 5/14 8L [place], worsening renal function 5/15 very good UOP, 1500 cc's 5/16 good UOP 1200CC's 5/17- patient had episode of respiratory distress with hypoxemia, we discussed this with daughter, high risk for intubation 5/18-  Patient is hemodynamically improved.  She is with confusion and intermittent lethargy.   CC  follow up respiratory failure Renal failure  SUBJECTIVE Prognosis is guarded 11 L [place] No SOB, resting comfortable Feels better creatinine improving H/H stable   BP (!) 128/97   Pulse 90   Temp 98.4 F (36.9 C) (Oral)   Resp 17   Ht 5\' 6"  (1.676 m)   Wt 119.7 kg   SpO2 100%   BMI 42.59 kg/m    I/O last 3 completed shifts: In: 1455.5 [P.O.:480; I.V.:925.5; IV Piggyback:50] Out: 2550 [Urine:2550] No intake/output data recorded.  SpO2: 100 % O2 Flow Rate (L/min): 45 L/min FiO2 (%): 100 %  Estimated body mass index is 42.59 kg/m as calculated from the following:   Height as of this encounter: 5\' 6"  (1.676 m).   Weight as of this encounter: 119.7 kg.   Review of Systems:  Gen:  Denies  fever, sweats, chills weight loss  HEENT: Denies blurred vision, double vision, ear pain, eye pain, hearing loss, nose bleeds, sore throat Cardiac:  No dizziness, chest pain or heaviness, chest tightness,edema, No JVD Resp:   No cough, -sputum production, -shortness of breath,-wheezing, -hemoptysis,  Gi: Denies swallowing difficulty, stomach pain, nausea or vomiting, diarrhea, constipation, bowel incontinence Gu:  Denies bladder incontinence, burning urine Ext:   Denies Joint pain, stiffness or swelling Skin: Denies  skin rash, easy bruising or bleeding or hives Endoc:  Denies polyuria, polydipsia , polyphagia or weight change Psych:   Denies depression, insomnia or hallucinations  Other:  All other systems negative   Physical Examination:   General Appearance: No distress  Neuro:without  focal findings,  speech normal,  HEENT: PERRLA, EOM intact.   Pulmonary: normal breath sounds, No wheezing.  CardiovascularNormal S1,S2.  No m/r/g.   Abdomen: Benign, Soft, non-tender. Renal:  No costovertebral tenderness  GU:  Not performed at this time. Endoc: No evident thyromegaly Skin:   warm, no rashes, no ecchymosis  Extremities: normal, no cyanosis, clubbing. PSYCHIATRIC: Mood, affect within normal limits.   ALL OTHER ROS ARE NEGATIVE   MEDICATIONS: I have reviewed all medications and confirmed regimen as documented   CULTURE RESULTS   Recent Results (from the past 240 hour(s))  Culture, blood (Routine x 2)     Status: None   Collection Time: 03/26/20 12:47 PM   Specimen: BLOOD  Result Value Ref Range Status   Specimen Description BLOOD BLOOD LEFT HAND  Final   Special Requests   Final    BOTTLES DRAWN AEROBIC AND ANAEROBIC Blood Culture adequate volume   Culture   Final    NO GROWTH 5 DAYS Performed at [facility], [address], [place], [address] [place]    Report Status 03/31/2020 FINAL  Final  Culture, blood (Routine x 2)     Status: None   Collection Time: 03/26/20 12:47 PM   Specimen: BLOOD  Result Value Ref Range Status   Specimen Description BLOOD LEFT ANTECUBITAL  Final   Special Requests   Final    BOTTLES DRAWN AEROBIC AND ANAEROBIC Blood Culture adequate volume   Culture   Final    NO GROWTH 5 DAYS Performed at [facility],  [place], [place] [address]    Report Status 03/31/2020 FINAL  Final  Urine Culture     Status: Abnormal   Collection Time: 03/26/20  1:55 PM   Specimen: Urine, Random  Result Value Ref Range Status   Specimen Description   Final    URINE, RANDOM Performed at [facility], [address] [place], [place] [address]    Special Requests   Final    NONE Performed at [facility], [place], [place] [address]    Culture >=100,000 COLONIES/mL  ESCHERICHIA COLI (A)  Final   Report Status 03/29/2020 FINAL  Final   Organism ID, Bacteria ESCHERICHIA COLI (A)  Final      Susceptibility   Escherichia coli - MIC*    AMPICILLIN <=2 SENSITIVE Sensitive     CEFAZOLIN <=4 SENSITIVE Sensitive     CEFTRIAXONE <=1 SENSITIVE Sensitive     CIPROFLOXACIN <=0.25 SENSITIVE Sensitive     GENTAMICIN <=1 SENSITIVE Sensitive     IMIPENEM <=0.25 SENSITIVE Sensitive     NITROFURANTOIN <=16 SENSITIVE Sensitive     TRIMETH/SULFA <=20 SENSITIVE Sensitive     AMPICILLIN/SULBACTAM <=2 SENSITIVE Sensitive     PIP/TAZO <=4 SENSITIVE Sensitive     * >=100,000 COLONIES/mL ESCHERICHIA COLI  SARS Coronavirus 2 by RT PCR (hospital order, performed in [place] hospital lab) Nasopharyngeal Nasopharyngeal Swab     Status: Abnormal   Collection Time: 03/26/20  2:47 PM   Specimen: Nasopharyngeal Swab  Result Value Ref Range Status   SARS Coronavirus 2 POSITIVE (A) NEGATIVE Final    Comment: RESULT CALLED TO, READ BACK BY AND VERIFIED WITH: CADY,R AT 1608 ON 03/26/2020 BY MOSLEY,J (NOTE) SARS-CoV-2 target nucleic acids are DETECTED SARS-CoV-2 RNA is generally detectable in upper respiratory specimens  during the acute phase of infection.  Positive results are indicative  of the presence of the identified virus, but do not rule out bacterial infection or co-infection with other pathogens not detected by the test.  Clinical correlation with patient history and  other diagnostic information is necessary to determine patient infection status.  The expected result is negative. Fact Sheet for Patients:   StrictlyIdeas.no  Fact Sheet for Healthcare Providers:   BankingDealers.co.za   This test is not yet approved or cleared by the [place] FDA and  has been authorized for detection and/or diagnosis of SARS-CoV-2 by FDA under an Emergency Use Authorization (EUA).  This EUA will remain in effect (meaning this test   can be used) for the duration of  the COVID-19 declaration under Section 564(b)(1) of the Act, 21 U.S.C. section 360-bbb-3(b)(1), unless the authorization is terminated or revoked sooner. Performed at [facility], [address] [place], [place] [address]   MRSA PCR Screening     Status: None   Collection Time: 03/26/20  9:56 PM   Specimen: Nasopharyngeal  Result Value Ref Range Status   MRSA by PCR NEGATIVE NEGATIVE Final    Comment:        The GeneXpert MRSA Assay (FDA approved for NASAL specimens only), is one component of a comprehensive MRSA colonization surveillance program. It is not intended to diagnose MRSA infection nor to guide or monitor treatment for MRSA infections. Performed at [facility], [address], [place], [place] [address]           IMAGING    DG Chest Port 1 View  Result Date: 03/30/2020 CLINICAL DATA:  Patient with vomiting.  Increased oxygen requirement. EXAM: PORTABLE CHEST 1 VIEW COMPARISON:  Chest radiograph 03/30/2020. FINDINGS: Monitoring leads overlie the patient. Stable cardiomegaly. Similar-appearing bilateral patchy areas of consolidation. Elevation right hemidiaphragm. Probable small left pleural effusion. No pneumothorax. IMPRESSION: Similar-appearing bilateral airspace opacities. Probable small left pleural effusion. Electronically Signed   By: Annia Belt M.D.   On: 03/30/2020 14:37   [place] EKG SITE RITE  Result Date: 03/30/2020 If Site Rite image not attached, placement could not be confirmed due to current cardiac rhythm.    Nutrition Status: Nutrition Problem: Increased nutrient needs Etiology: catabolic illness(COVID-19) Signs/Symptoms: estimated needs Interventions: Ensure Enlive (each supplement provides 350kcal and 20 grams of protein), Magic cup     ASSESSMENT AND PLAN   COVID-19 pneumonia infection-pneumonia/pneumonitis IV steroids  IV remdisivir Aggressive pulm toilet recommended OOB to  chair as tolerated  Pulmonary hygiene Continue proning as tolerated due to severe hypoxia   Maintain airborne and contact precautions  As needed bronchodilators (MDI) Vitamin C and zinc Antitussives   Morbid obesity, possible OSA.   Will certainly impact respiratory mechanics   ACUTE KIDNEY INJURY/Renal Failure -continue Foley Catheter-assess need -Avoid nephrotoxic agents -Follow urine output, BMP -Ensure adequate renal perfusion, optimize oxygenation -Renal dose medications  CARDIAC ICU monitoring  ID -continue IV abx as prescibed -follow up cultures  GI GI PROPHYLAXIS as indicated  NUTRITIONAL STATUS Nutrition Status: Nutrition Problem: Increased nutrient needs Etiology: catabolic illness(COVID-19) Signs/Symptoms: estimated needs Interventions: Ensure Enlive (each supplement provides 350kcal and 20 grams of protein), Magic cup   DIET-->as tolerated Constipation protocol as indicated  ENDO - will use ICU hypoglycemic\Hyperglycemia protocol if indicated     ELECTROLYTES -follow labs as needed -replace as needed -pharmacy consultation and following   DVT/GI PRX ordered and assessed TRANSFUSIONS AS NEEDED MONITOR FSBS  CASE DISCUSSED IN MULTIDISCIPLINARY ROUNDS WITH ICU TEAM  Critical care provider statement:    Critical care time (minutes):  34   Critical care time was exclusive of:  Separately billable procedures and  treating other patients   Critical care was necessary to treat or prevent imminent or  life-threatening deterioration of the following conditions:  acute hypoxemic respiratory failure due to [phone]    Critical care was time spent personally by me on the following  activities:  Development of treatment plan with patient or surrogate,  discussions with consultants, evaluation of patient's response to  treatment, examination of patient, obtaining history from patient or  surrogate, ordering and performing treatments and interventions,  ordering  and review of laboratory studies and re-evaluation of patient's condition   I assumed direction of critical care for this patient from another  provider in my specialty: no      Vida Rigger, M.D.  Pulmonary & Critical Care Medicine  [facility]

## 2020-03-31 NOTE — Progress Notes (Signed)
Chaplain received call from Luisa Hart, a cousin who lives in [place]. He shared that the family has been in grief due to death of another relative and he is grieving that he cannot be with family during this time. Chaplain offered listening presence to Luisa Hart and prayer over the phone. Luisa Hart requested chaplain to contact family. Chaplain asked Luisa Hart to have family call chaplain for spiritual care.     03/31/20 1800  Clinical Encounter Type  Visited With Family  Visit Type Follow-up  Spiritual Encounters  Spiritual Needs Prayer

## 2020-03-31 NOTE — Progress Notes (Signed)
[facility]  ROUNDING NOTE   Subjective:   UOP 1850 ( )   Creatinine 2.17 (2.86) (3.92)  Objective:  Vital signs in last 24 hours:  Temp:  [98.1 F (36.7 C)-98.5 F (36.9 C)] 98.4 F (36.9 C) (05/18 0400) Pulse Rate:  [72-102] 90 (05/18 0400) Resp:  [16-28] 17 (05/18 0600) BP: (122-171)/(77-130) 128/97 (05/18 0600) SpO2:  [88 %-100 %] 100 % (05/18 0400) FiO2 (%):  [100 %] 100 % (05/18 0400)  Weight change:  Filed Weights   03/26/20 1244 03/30/20 0411  Weight: 116.3 kg 119.7 kg    Intake/Output: I/O last 3 completed shifts: In: 1455.5 [P.O.:480; I.V.:925.5; IV Piggyback:50] Out: 2550 [Urine:2550]   Intake/Output this shift:  No intake/output data recorded.  Physical Exam: General: ill-appearing  Head: HFNC  Eyes: Anicteric  Neck: trachea midline  Lungs:  clear  Heart: regular  Extremities: No edema  Neuro: Awake, alert, conversant  Access:   None          Basic Metabolic Panel: Recent Labs  Lab 03/27/20 0538 03/27/20 0538 03/28/20 0420 03/28/20 0420 03/29/20 0514 03/30/20 0423 03/31/20 0558  NA 143  --  143  --  144 148* 149*  K 4.0  --  3.5  --  4.4 3.4* 3.3*  CL 113*  --  107  --  104 101 103  CO2 18*  --  22  --  26 32 35*  GLUCOSE 119*  --  140*  --  140* 131* 117*  BUN 77*  --  84*  --  83* 76* 60*  CREATININE 6.52*  --  5.37*  --  3.92* 2.86* 2.17*  CALCIUM 7.9*   < > 7.8*   < > 7.7* 8.3* 8.1*  MG  --   --   --   --  1.6* 2.2 2.1  PHOS  --   --   --   --  4.0  --   --    < > = values in this interval not displayed.    Liver Function Tests: Recent Labs  Lab 03/27/20 0538 03/28/20 0420 03/29/20 0514 03/30/20 0423 03/31/20 0558  AST 94* 67* 57* 60* 52*  ALT 33 29 26 29 24   ALKPHOS 61 56 54 58 58  BILITOT 0.5 0.4 0.7 0.4 0.4  PROT 7.0 6.7 6.4* 6.7 6.1*  ALBUMIN 2.8* 2.5* 2.4* 2.6* 2.4*   No results for input(s): LIPASE, AMYLASE in the last 168 hours. No results for input(s): AMMONIA in the last 168  hours.  CBC: Recent Labs  Lab 03/26/20 1247 03/26/20 1247 03/27/20 0538 03/27/20 0538 03/28/20 0420 03/28/20 1909 03/29/20 0514 03/30/20 0423 03/31/20 0558  WBC 6.2   < > 6.3  --  7.1  --  9.2 10.2 12.3*  NEUTROABS 4.6  --   --   --   --   --   --   --   --   HGB 6.9*   < > 6.4*   < > 6.2* 7.4* 8.0* 8.3* 7.1*  HCT 23.0*   < > 21.2*   < > 19.9* 23.5* 24.7* 26.5* 24.0*  MCV 65.0*   < > 65.0*  --  61.2*  --  63.7* 64.8* 69.2*  PLT 473*   < > 494*  --  639*  --  610* 732* 645*   < > = values in this interval not displayed.    Cardiac Enzymes: No results for input(s): CKTOTAL, CKMB, CKMBINDEX, TROPONINI in the last  168 hours.  BNP: Invalid input(s): POCBNP  CBG: Recent Labs  Lab 03/26/20 1617 03/28/20 0851 03/28/20 1620 03/29/20 1145 03/30/20 2050  GLUCAP 94 145* 170* 143* 114*    Microbiology: Results for orders placed or performed during the hospital encounter of 03/26/20  Culture, blood (Routine x 2)     Status: None   Collection Time: 03/26/20 12:47 PM   Specimen: BLOOD  Result Value Ref Range Status   Specimen Description BLOOD BLOOD LEFT HAND  Final   Special Requests   Final    BOTTLES DRAWN AEROBIC AND ANAEROBIC Blood Culture adequate volume   Culture   Final    NO GROWTH 5 DAYS Performed at [facility], [address] [place], [place] [address]    Report Status 03/31/2020 FINAL  Final  Culture, blood (Routine x 2)     Status: None   Collection Time: 03/26/20 12:47 PM   Specimen: BLOOD  Result Value Ref Range Status   Specimen Description BLOOD LEFT ANTECUBITAL  Final   Special Requests   Final    BOTTLES DRAWN AEROBIC AND ANAEROBIC Blood Culture adequate volume   Culture   Final    NO GROWTH 5 DAYS Performed at [facility], [address] [place], [place] [address]    Report Status 03/31/2020 FINAL  Final  Urine Culture     Status: Abnormal   Collection Time: 03/26/20  1:55 PM   Specimen: Urine, Random  Result Value Ref  Range Status   Specimen Description   Final    URINE, RANDOM Performed at [facility], [address] [place], [place] [address]    Special Requests   Final    NONE Performed at [facility], [address] [place], [place] [address]    Culture >=100,000 COLONIES/mL ESCHERICHIA COLI (A)  Final   Report Status 03/29/2020 FINAL  Final   Organism ID, Bacteria ESCHERICHIA COLI (A)  Final      Susceptibility   Escherichia coli - MIC*    AMPICILLIN <=2 SENSITIVE Sensitive     CEFAZOLIN <=4 SENSITIVE Sensitive     CEFTRIAXONE <=1 SENSITIVE Sensitive     CIPROFLOXACIN <=0.25 SENSITIVE Sensitive     GENTAMICIN <=1 SENSITIVE Sensitive     IMIPENEM <=0.25 SENSITIVE Sensitive     NITROFURANTOIN <=16 SENSITIVE Sensitive     TRIMETH/SULFA <=20 SENSITIVE Sensitive     AMPICILLIN/SULBACTAM <=2 SENSITIVE Sensitive     PIP/TAZO <=4 SENSITIVE Sensitive     * >=100,000 COLONIES/mL ESCHERICHIA COLI  SARS Coronavirus 2 by RT PCR (hospital order, performed in [facility] hospital lab) Nasopharyngeal Nasopharyngeal Swab     Status: Abnormal   Collection Time: 03/26/20  2:47 PM   Specimen: Nasopharyngeal Swab  Result Value Ref Range Status   SARS Coronavirus 2 POSITIVE (A) NEGATIVE Final    Comment: RESULT CALLED TO, READ BACK BY AND VERIFIED WITH: CADY,R AT 1608 ON 03/26/2020 BY MOSLEY,J (NOTE) SARS-CoV-2 target nucleic acids are DETECTED SARS-CoV-2 RNA is generally detectable in upper respiratory specimens  during the acute phase of infection.  Positive results are indicative  of the presence of the identified virus, but do not rule out bacterial infection or co-infection with other pathogens not detected by the test.  Clinical correlation with patient history and  other diagnostic information is necessary to determine patient infection status.  The expected result is negative. Fact Sheet for Patients:   BoilerBrush.com.cy  Fact Sheet for Healthcare  Providers:  BankingDealers.co.za   This test is not yet approved or cleared by the [place] and  has been authorized for detection and/or diagnosis of SARS-CoV-2 by FDA under an Emergency Use Authorization (EUA).  This EUA will remain in effect (meaning this test  can be used) for the duration of  the COVID-19 declaration under Section 564(b)(1) of the Act, 21 U.S.C. section 360-bbb-3(b)(1), unless the authorization is terminated or revoked sooner. Performed at [facility], [address] [place], [place] [address]   MRSA PCR Screening     Status: None   Collection Time: 03/26/20  9:56 PM   Specimen: Nasopharyngeal  Result Value Ref Range Status   MRSA by PCR NEGATIVE NEGATIVE Final    Comment:        The GeneXpert MRSA Assay (FDA approved for NASAL specimens only), is one component of a comprehensive MRSA colonization surveillance program. It is not intended to diagnose MRSA infection nor to guide or monitor treatment for MRSA infections. Performed at [facility], [address] [place], [place] [address]     Coagulation Studies: No results for input(s): LABPROT, INR in the last 72 hours.  Urinalysis: No results for input(s): COLORURINE, LABSPEC, PHURINE, GLUCOSEU, HGBUR, BILIRUBINUR, KETONESUR, PROTEINUR, UROBILINOGEN, NITRITE, LEUKOCYTESUR in the last 72 hours.  Invalid input(s): APPERANCEUR    Imaging: DG Chest Port 1 View  Result Date: 03/30/2020 CLINICAL DATA:  Patient with vomiting. Increased oxygen requirement. EXAM: PORTABLE CHEST 1 VIEW COMPARISON:  Chest radiograph 03/30/2020. FINDINGS: Monitoring leads overlie the patient. Stable cardiomegaly. Similar-appearing bilateral patchy areas of consolidation. Elevation right hemidiaphragm. Probable small left pleural effusion. No pneumothorax. IMPRESSION: Similar-appearing bilateral airspace opacities. Probable small left pleural effusion. Electronically Signed    By: Lovey Newcomer M.D.   On: 03/30/2020 14:37   DG Chest Port 1 View  Result Date: 03/30/2020 CLINICAL DATA:  Shortness of breath EXAM: PORTABLE CHEST 1 VIEW COMPARISON:  03/27/2020 FINDINGS: Persistent bilateral opacities. Slightly decreased left mid lung consolidation. There is otherwise overall similar lung aeration. No significant pleural effusion. No pneumothorax. Stable cardiomediastinal contours. IMPRESSION: Persistent bilateral opacities with slightly decreased left mid lung consolidation. Otherwise similar lung aeration compared to 03/27/2020. Electronically Signed   By: Macy Mis M.D.   On: 03/30/2020 09:05   [place] EKG SITE RITE  Result Date: 03/30/2020 If Site Rite image not attached, placement could not be confirmed due to current cardiac rhythm.    Medications:   . sodium chloride    .  ceFAZolin (ANCEF) IV 1 g (03/31/20 0529)  . dextrose     . sodium chloride   Intravenous Once  . aerochamber plus with mask  1 each Other Once  . albuterol  1 puff Inhalation Q4H  . B-complex with vitamin C  1 tablet Oral Daily  . buprenorphine-naloxone  1 tablet Sublingual Daily  . Chlorhexidine Gluconate Cloth  6 each Topical Daily  . ciclesonide  4 puff Inhalation BID  . dexamethasone (DECADRON) injection  6 mg Intravenous Daily  . enoxaparin (LOVENOX) injection  40 mg Subcutaneous Q24H  . feeding supplement (ENSURE ENLIVE)  237 mL Oral TID BM  . ipratropium  2 puff Inhalation Q4H  . ivermectin  150 mcg/kg Oral Daily  . labetalol  100 mg Oral BID  . sodium chloride flush  10-40 mL Intracatheter Q12H  . sodium chloride flush  3 mL Intravenous Q12H   sodium chloride, acetaminophen, docusate sodium, lip balm, ondansetron (ZOFRAN) IV, polyethylene glycol, sodium chloride  flush, sodium chloride flush  Assessment/ Plan:  Ms. Diane Keller is a 44 y.o. black female with history of anxiety, GERD, prior history of CVA who presents with COVID-19 infection and acute kidney injury.  1.   Acute kidney injury secondary to acute tubular necrosis.Baseline creatinine of 0.87 in 2018.  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 Nonoliguric urine output  2. Hypernatremia and hypokalemia:  - preplace  3. Anemia with renal failure: hemoglobin 7.1 - status post PRBC transfusion on 5/15.   4. Thrombocytosis: with elevated D-dimer. Lower extremity dopplers are negative.   5. COVID-19 pneumonia on remdesivir, azithro, ceftriaxone, and dexamethazone. Requiring HFNC Appreciate pulmonary input.   6. Urinary Tract Infection: E. Coli - treated with ceftriaxone.    LOS: 5 Sarath Kolluru 5/18/202111:21 AM

## 2020-03-31 NOTE — Progress Notes (Signed)
Pharmacy Electrolyte Monitoring Consult:  Pharmacy consulted to assist in monitoring and replacing electrolytes in this 43 y.o. female admitted on 03/26/2020 with COVID-19 infection, pneumonia/pneumonitis.   Labs:  Sodium (mmol/L)  Date Value  03/31/2020 149 (H)  05/11/2012 140   Potassium (mmol/L)  Date Value  03/31/2020 3.3 (L)  05/11/2012 3.2 (L)   Magnesium (mg/dL)  Date Value  14/23/2009 2.1   Phosphorus (mg/dL)  Date Value  41/79/1995 4.0   Calcium (mg/dL)  Date Value  79/00/9200 8.1 (L)   Calcium, Total (mg/dL)  Date Value  41/59/3012 9.2   Albumin (g/dL)  Date Value  37/99/0940 2.4 (L)  05/11/2012 3.7   Corrected Ca: 9.38 mg/dL  Assessment/Plan: 1. Electrolytes:   K 3.3, added KLOR-CON tab PO x1 dose   Na 149, added D5 for hydration   Follow AM labs and replace as indicated  2. Glucose: patient on dexamethasone 6mg  IV daily (day #6)  Glucose controlled   Continue to monitor   Pharmacy will continue to monitor and adjust per consult.    03/31/2020 2:21 PM

## 2020-03-31 NOTE — Progress Notes (Signed)
Pt's husband has requested that pt be transferred to [facility] for further care.  Called [facility] transfer center, who put me in touch with ICU Physician Dr. Cherly Hensen.  We discussed Diane Keller's case, and Dr. Cherly Hensen feels pt is receiving appropriate treatment currently (and would not add anything else), and subsequently DECLINES pt transfer request.      Harlon Ditty, AGACNP-BC  Pulmonary & Critical Care Medicine Pager: [phone]

## 2020-04-01 ENCOUNTER — Inpatient Hospital Stay: Payer: Medicaid Other

## 2020-04-01 LAB — CBC
HCT: 24.9 % — ABNORMAL LOW (ref 36.0–46.0)
Hemoglobin: 7.2 g/dL — ABNORMAL LOW (ref 12.0–15.0)
MCH: 20.4 pg — ABNORMAL LOW (ref 26.0–34.0)
MCHC: 28.9 g/dL — ABNORMAL LOW (ref 30.0–36.0)
MCV: 70.5 fL — ABNORMAL LOW (ref 80.0–100.0)
Platelets: 572 10*3/uL — ABNORMAL HIGH (ref 150–400)
RBC: 3.53 MIL/uL — ABNORMAL LOW (ref 3.87–5.11)
RDW: 22.8 % — ABNORMAL HIGH (ref 11.5–15.5)
WBC: 19.5 10*3/uL — ABNORMAL HIGH (ref 4.0–10.5)
nRBC: 0.1 % (ref 0.0–0.2)

## 2020-04-01 LAB — COMPREHENSIVE METABOLIC PANEL
ALT: 21 U/L (ref 0–44)
AST: 40 U/L (ref 15–41)
Albumin: 2.2 g/dL — ABNORMAL LOW (ref 3.5–5.0)
Alkaline Phosphatase: 62 U/L (ref 38–126)
Anion gap: 10 (ref 5–15)
BUN: 46 mg/dL — ABNORMAL HIGH (ref 6–20)
CO2: 34 mmol/L — ABNORMAL HIGH (ref 22–32)
Calcium: 8.4 mg/dL — ABNORMAL LOW (ref 8.9–10.3)
Chloride: 105 mmol/L (ref 98–111)
Creatinine, Ser: 1.85 mg/dL — ABNORMAL HIGH (ref 0.44–1.00)
GFR calc Af Amer: 37 mL/min — ABNORMAL LOW (ref >60)
GFR calc non Af Amer: 32 mL/min — ABNORMAL LOW (ref >60)
Glucose, Bld: 129 mg/dL — ABNORMAL HIGH (ref 70–99)
Potassium: 3.6 mmol/L (ref 3.5–5.1)
Sodium: 149 mmol/L — ABNORMAL HIGH (ref 135–145)
Total Bilirubin: 0.5 mg/dL (ref 0.3–1.2)
Total Protein: 6.6 g/dL (ref 6.5–8.1)

## 2020-04-01 LAB — PROCALCITONIN: Procalcitonin: 2.26 ng/mL

## 2020-04-01 LAB — FIBRIN DERIVATIVES D-DIMER (ARMC ONLY): Fibrin derivatives D-dimer (ARMC): 844.41 ng/mL (FEU) — ABNORMAL HIGH (ref 0.00–499.00)

## 2020-04-01 LAB — C-REACTIVE PROTEIN: CRP: 20 mg/dL — ABNORMAL HIGH (ref <1.0)

## 2020-04-01 MED ORDER — LACTATED RINGERS IV BOLUS
1000.0000 mL | Freq: Once | INTRAVENOUS | Status: AC
  Administered 2020-04-01: 1000 mL via INTRAVENOUS

## 2020-04-01 MED ORDER — LABETALOL HCL 5 MG/ML IV SOLN
10.0000 mg | INTRAVENOUS | Status: DC | PRN
  Administered 2020-04-02 – 2020-04-10 (×4): 10 mg via INTRAVENOUS
  Filled 2020-04-01 (×5): qty 4

## 2020-04-01 MED ORDER — LABETALOL HCL 200 MG PO TABS
300.0000 mg | ORAL_TABLET | Freq: Two times a day (BID) | ORAL | Status: DC
  Administered 2020-04-01 – 2020-04-16 (×30): 300 mg via ORAL
  Filled 2020-04-01 (×34): qty 1

## 2020-04-01 NOTE — Progress Notes (Signed)
[facility]  ROUNDING NOTE   Subjective:   Creatinine 1.85 (2.17) (2.86) (3.92)  D5W at 54mL/hr  Continues to have some nausea.   Objective:  Vital signs in last 24 hours:  Temp:  [97.3 F (36.3 C)-99.2 F (37.3 C)] 97.3 F (36.3 C) (05/18 1945) Pulse Rate:  [95-107] 97 (05/18 1800) Resp:  [16-24] 16 (05/18 1800) BP: (137-170)/(83-135) 147/89 (05/18 1800) SpO2:  [93 %-100 %] 95 % (05/18 1917) FiO2 (%):  [80 %-90 %] 80 % (05/18 1917) Weight:  [118.5 kg] 118.5 kg (05/19 0500)  Weight change:  Filed Weights   03/26/20 1244 03/30/20 0411 04/01/20 0500  Weight: 116.3 kg 119.7 kg 118.5 kg    Intake/Output: I/O last 3 completed shifts: In: 1939.2 [P.O.:360; I.V.:133.3; Other:825; IV Piggyback:620.9] Out: 1300 [Urine:1300]   Intake/Output this shift:  No intake/output data recorded.  Physical Exam: Not examined                                   Basic Metabolic Panel: Recent Labs  Lab 03/28/20 0420 03/28/20 0420 03/29/20 0514 03/29/20 0514 03/30/20 0423 03/31/20 0558 04/01/20 0457  NA 143  --  144  --  148* 149* 149*  K 3.5  --  4.4  --  3.4* 3.3* 3.6  CL 107  --  104  --  101 103 105  CO2 22  --  26  --  32 35* 34*  GLUCOSE 140*  --  140*  --  131* 117* 129*  BUN 84*  --  83*  --  76* 60* 46*  CREATININE 5.37*  --  3.92*  --  2.86* 2.17* 1.85*  CALCIUM 7.8*   < > 7.7*   < > 8.3* 8.1* 8.4*  MG  --   --  1.6*  --  2.2 2.1  --   PHOS  --   --  4.0  --   --   --   --    < > = values in this interval not displayed.    Liver Function Tests: Recent Labs  Lab 03/28/20 0420 03/29/20 0514 03/30/20 0423 03/31/20 0558 04/01/20 0457  AST 67* 57* 60* 52* 40  ALT 29 26 29 24 21   ALKPHOS 56 54 58 58 62  BILITOT 0.4 0.7 0.4 0.4 0.5  PROT 6.7 6.4* 6.7 6.1* 6.6  ALBUMIN 2.5* 2.4* 2.6* 2.4* 2.2*   No results for input(s): LIPASE, AMYLASE in the last 168 hours. Recent Labs  Lab 03/31/20 1200  AMMONIA 19    CBC: Recent Labs  Lab  03/26/20 1247 03/27/20 0538 03/28/20 0420 03/28/20 0420 03/28/20 1909 03/29/20 0514 03/30/20 0423 03/31/20 0558 04/01/20 0457  WBC 6.2   < > 7.1  --   --  9.2 10.2 12.3* 19.5*  NEUTROABS 4.6  --   --   --   --   --   --   --   --   HGB 6.9*   < > 6.2*   < > 7.4* 8.0* 8.3* 7.1* 7.2*  HCT 23.0*   < > 19.9*   < > 23.5* 24.7* 26.5* 24.0* 24.9*  MCV 65.0*   < > 61.2*  --   --  63.7* 64.8* 69.2* 70.5*  PLT 473*   < > 639*  --   --  610* 732* 645* 572*   < > = values in this interval not displayed.  Cardiac Enzymes: No results for input(s): CKTOTAL, CKMB, CKMBINDEX, TROPONINI in the last 168 hours.  BNP: Invalid input(s): POCBNP  CBG: Recent Labs  Lab 03/26/20 1617 03/28/20 0851 03/28/20 1620 03/29/20 1145 03/30/20 2050  GLUCAP 94 145* 170* 143* 114*    Microbiology: Results for orders placed or performed during the hospital encounter of 03/26/20  Culture, blood (Routine x 2)     Status: None   Collection Time: 03/26/20 12:47 PM   Specimen: BLOOD  Result Value Ref Range Status   Specimen Description BLOOD BLOOD LEFT HAND  Final   Special Requests   Final    BOTTLES DRAWN AEROBIC AND ANAEROBIC Blood Culture adequate volume   Culture   Final    NO GROWTH 5 DAYS Performed at [facility], [address] [place], [place] [address]    Report Status 03/31/2020 FINAL  Final  Culture, blood (Routine x 2)     Status: None   Collection Time: 03/26/20 12:47 PM   Specimen: BLOOD  Result Value Ref Range Status   Specimen Description BLOOD LEFT ANTECUBITAL  Final   Special Requests   Final    BOTTLES DRAWN AEROBIC AND ANAEROBIC Blood Culture adequate volume   Culture   Final    NO GROWTH 5 DAYS Performed at [facility], [address] [place], [place] [address]    Report Status 03/31/2020 FINAL  Final  Urine Culture     Status: Abnormal   Collection Time: 03/26/20  1:55 PM   Specimen: Urine, Random  Result Value Ref Range Status   Specimen  Description   Final    URINE, RANDOM Performed at [facility], [address], [place], [place] [address]    Special Requests   Final    NONE Performed at [facility], [address] [place], [place] [address]    Culture >=100,000 COLONIES/mL ESCHERICHIA COLI (A)  Final   Report Status 03/29/2020 FINAL  Final   Organism ID, Bacteria ESCHERICHIA COLI (A)  Final      Susceptibility   Escherichia coli - MIC*    AMPICILLIN <=2 SENSITIVE Sensitive     CEFAZOLIN <=4 SENSITIVE Sensitive     CEFTRIAXONE <=1 SENSITIVE Sensitive     CIPROFLOXACIN <=0.25 SENSITIVE Sensitive     GENTAMICIN <=1 SENSITIVE Sensitive     IMIPENEM <=0.25 SENSITIVE Sensitive     NITROFURANTOIN <=16 SENSITIVE Sensitive     TRIMETH/SULFA <=20 SENSITIVE Sensitive     AMPICILLIN/SULBACTAM <=2 SENSITIVE Sensitive     PIP/TAZO <=4 SENSITIVE Sensitive     * >=100,000 COLONIES/mL ESCHERICHIA COLI  SARS Coronavirus 2 by RT PCR (hospital order, performed in [facility] hospital lab) Nasopharyngeal Nasopharyngeal Swab     Status: Abnormal   Collection Time: 03/26/20  2:47 PM   Specimen: Nasopharyngeal Swab  Result Value Ref Range Status   SARS Coronavirus 2 POSITIVE (A) NEGATIVE Final    Comment: RESULT CALLED TO, READ BACK BY AND VERIFIED WITH: CADY,R AT 1608 ON 03/26/2020 BY MOSLEY,J (NOTE) SARS-CoV-2 target nucleic acids are DETECTED SARS-CoV-2 RNA is generally detectable in upper respiratory specimens  during the acute phase of infection.  Positive results are indicative  of the presence of the identified virus, but do not rule out bacterial infection or co-infection with other pathogens not detected by the test.  Clinical correlation with patient history and  other diagnostic information is necessary to determine patient infection status.  The expected result is negative. Fact Sheet for  Patients:   BoilerBrush.com.cy  Fact Sheet for Healthcare Providers:    https://pope.com/   This test is not yet approved or cleared by the [place] FDA and  has been authorized for detection and/or diagnosis of SARS-CoV-2 by FDA under an Emergency Use Authorization (EUA).  This EUA will remain in effect (meaning this test  can be used) for the duration of  the COVID-19 declaration under Section 564(b)(1) of the Act, 21 U.S.C. section 360-bbb-3(b)(1), unless the authorization is terminated or revoked sooner. Performed at [facility], [address] [place], [place] [address]   MRSA PCR Screening     Status: None   Collection Time: 03/26/20  9:56 PM   Specimen: Nasopharyngeal  Result Value Ref Range Status   MRSA by PCR NEGATIVE NEGATIVE Final    Comment:        The GeneXpert MRSA Assay (FDA approved for NASAL specimens only), is one component of a comprehensive MRSA colonization surveillance program. It is not intended to diagnose MRSA infection nor to guide or monitor treatment for MRSA infections. Performed at [facility], [address] [place], [place] [address]     Coagulation Studies: No results for input(s): LABPROT, INR in the last 72 hours.  Urinalysis: No results for input(s): COLORURINE, LABSPEC, PHURINE, GLUCOSEU, HGBUR, BILIRUBINUR, KETONESUR, PROTEINUR, UROBILINOGEN, NITRITE, LEUKOCYTESUR in the last 72 hours.  Invalid input(s): APPERANCEUR    Imaging: DG Chest Port 1 View  Result Date: 04/01/2020 CLINICAL DATA:  Acute respiratory failure.  COVID-19 positive. EXAM: PORTABLE CHEST 1 VIEW COMPARISON:  03/30/2020 FINDINGS: Right-sided PICC line has tip overlying the SVC. Lungs are hypoinflated with persistent patchy bilateral airspace opacification with slight overall improvement compatible with known multifocal pneumonia. Suggestion of a small right pleural effusion with associated basilar atelectasis which is new. Cardiomediastinal silhouette and remainder of the exam is  unchanged. IMPRESSION: 1. Hypoinflation with persistent multifocal airspace process with slight overall improvement likely multifocal pneumonia. New small right pleural effusion with mild associated basilar atelectasis. 2.  Right-sided PICC line with tip over the SVC. Electronically Signed   By: Elberta Fortis M.D.   On: 04/01/2020 07:15   DG Chest Port 1 View  Result Date: 03/30/2020 CLINICAL DATA:  Patient with vomiting. Increased oxygen requirement. EXAM: PORTABLE CHEST 1 VIEW COMPARISON:  Chest radiograph 03/30/2020. FINDINGS: Monitoring leads overlie the patient. Stable cardiomegaly. Similar-appearing bilateral patchy areas of consolidation. Elevation right hemidiaphragm. Probable small left pleural effusion. No pneumothorax. IMPRESSION: Similar-appearing bilateral airspace opacities. Probable small left pleural effusion. Electronically Signed   By: Annia Belt M.D.   On: 03/30/2020 14:37   DG Chest Port 1 View  Result Date: 03/30/2020 CLINICAL DATA:  Shortness of breath EXAM: PORTABLE CHEST 1 VIEW COMPARISON:  03/27/2020 FINDINGS: Persistent bilateral opacities. Slightly decreased left mid lung consolidation. There is otherwise overall similar lung aeration. No significant pleural effusion. No pneumothorax. Stable cardiomediastinal contours. IMPRESSION: Persistent bilateral opacities with slightly decreased left mid lung consolidation. Otherwise similar lung aeration compared to 03/27/2020. Electronically Signed   By: Guadlupe Spanish M.D.   On: 03/30/2020 09:05   [place] EKG SITE RITE  Result Date: 03/30/2020 If Site Rite image not attached, placement could not be confirmed due to current cardiac rhythm.    Medications:   . sodium chloride    .  ceFAZolin (ANCEF) IV 1 g (04/01/20 0507)  . dextrose 75 mL/hr at 04/01/20 5284   . sodium chloride   Intravenous Once  . aerochamber plus with  mask  1 each Other Once  . albuterol  1 puff Inhalation Q4H  . B-complex with vitamin C  1 tablet Oral Daily   . buprenorphine-naloxone  1 tablet Sublingual Daily  . Chlorhexidine Gluconate Cloth  6 each Topical Daily  . ciclesonide  4 puff Inhalation BID  . dexamethasone (DECADRON) injection  6 mg Intravenous Daily  . enoxaparin (LOVENOX) injection  40 mg Subcutaneous Q24H  . feeding supplement (ENSURE ENLIVE)  237 mL Oral TID BM  . ipratropium  2 puff Inhalation Q4H  . ivermectin  150 mcg/kg Oral Daily  . labetalol  100 mg Oral BID  . sodium chloride flush  10-40 mL Intracatheter Q12H  . sodium chloride flush  3 mL Intravenous Q12H   sodium chloride, acetaminophen, docusate sodium, labetalol, lip balm, ondansetron (ZOFRAN) IV, polyethylene glycol, sodium chloride flush, sodium chloride flush  Assessment/ Plan:  Ms. Diane Keller is a 44 y.o. black female with history of anxiety, GERD, prior history of CVA who presents with COVID-19 infection and acute kidney injury.  1.  Acute kidney injury secondary to acute tubular necrosis.Baseline creatinine of 0.87 in 2018.  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 Nonoliguric urine output Creatinine improving.   2. Hypertension: elevated.  - increase labetalol to 300mg  PO  3. Hypernatremia and hypokalemia:  - Dextrose infusion - encourage PO intake.   4. Hypokalemia: improved with PO intake  5. Anemia with renal failure: hemoglobin 7.2. Status post PRBC transfusion on 5/15.   6. Thrombocytosis: with elevated D-dimer. Lower extremity dopplers are negative.   7. COVID-19 pneumonia on remdesivir, azithro, ceftriaxone, and dexamethazone. Requiring HFNC Appreciate pulmonary input.   6. Urinary Tract Infection: E. Coli - treated with ceftriaxone.    LOS: 6 Fannie Alomar 5/19/20218:58 AM

## 2020-04-01 NOTE — Progress Notes (Signed)
Pharmacy Electrolyte Monitoring Consult:  Pharmacy consulted to assist in monitoring and replacing electrolytes in this 44 y.o. female admitted on 03/26/2020 with COVID-19 infection and AKI.   Labs:  Sodium (mmol/L)  Date Value  04/01/2020 149 (H)  05/11/2012 140   Potassium (mmol/L)  Date Value  04/01/2020 3.6  05/11/2012 3.2 (L)   Magnesium (mg/dL)  Date Value  94/05/6807 2.1   Phosphorus (mg/dL)  Date Value  81/08/3158 4.0   Calcium (mg/dL)  Date Value  45/85/9292 8.4 (L)   Calcium, Total (mg/dL)  Date Value  44/62/8638 9.2   Albumin (g/dL)  Date Value  17/71/1657 2.2 (L)  05/11/2012 3.7   Assessment/Plan:  Electrolytes:  Goal for electrolytes WNL.   Currently on D5 @75  mL/h for hypernatremia.  Otherwise, no replacement needed at this time.  Electrolytes with AM labs.  Glucose: SBG 129 this AM.  IV steroids removed today due to confusion.  Will continue to monitor glucose trend.  Pharmacy will continue to monitor and adjust per consult.    , PharmD 04/01/2020 11:59 AM

## 2020-04-01 NOTE — Progress Notes (Signed)
Physical Therapy Treatment Patient Details Name: Diane Keller MRN: [id number] DOB: [birth year] Today's Date: 04/01/2020    History of Present Illness Per MD notes: Pt is a 44 y.o. female with a history of GERD and prior stroke with residual left-sided motor deficit who is brought to the ED due to fever and generalized weakness.  MD assessment includes: COVID-19 pneumonia, AMS/confusion, morbid obesity, and AKI/renal failure.    PT Comments    Pt pleasant but confused during the session requiring extensive multi-modal cueing for most functional tasks.  Pt required physical assistance for both bed mobility tasks and transfers.  Once in standing pt was able to take several small steps towards the recliner but even with extensive multi-modal cuing was unable to sequence well enough to make it to the chair safely and was returned to sitting at the EOB.  Pt required +2 assist for safety and to manage extensive lines/leads/tubes. Pt will benefit from PT services in a SNF setting upon discharge to safely address deficits listed in patient problem list for decreased caregiver assistance and eventual return to PLOF.    Follow Up Recommendations  SNF     Equipment Recommendations  Rolling walker with 5" wheels    Recommendations for Other Services       Precautions / Restrictions Precautions Precautions: Fall Restrictions Weight Bearing Restrictions: No    Mobility  Bed Mobility Overal bed mobility: Needs Assistance Bed Mobility: Supine to Sit;Sit to Supine     Supine to sit: Min assist Sit to supine: Mod assist   General bed mobility comments: Min to mod A for BLE control and for trunk to full upright sitting  Transfers Overall transfer level: Needs assistance Equipment used: Rolling walker (2 wheeled) Transfers: Sit to/from Stand Sit to Stand: Min assist;From elevated surface         General transfer comment: Mod verbal cues for sequencing  Ambulation/Gait Ambulation/Gait  assistance: Min assist;+2 safety/equipment Gait Distance (Feet): 2 Feet Assistive device: Rolling walker (2 wheeled) Gait Pattern/deviations: Step-through pattern;Trunk flexed Gait velocity: decreased   General Gait Details: Pt required max verbal and tactile cues for sequencing and was unable to make it to the chair secondary to confusion/weakness and returned to sitting at the EOB   Stairs             Wheelchair Mobility    Modified Rankin (Stroke Patients Only)       Balance Overall balance assessment: Needs assistance   Sitting balance-Leahy Scale: Good     Standing balance support: Bilateral upper extremity supported Standing balance-Leahy Scale: Fair Standing balance comment: Mod lean on the RW for support                            Cognition Arousal/Alertness: Awake/alert Behavior During Therapy: Flat affect Overall Cognitive Status: No family/caregiver present to determine baseline cognitive functioning                                 General Comments: Pt with difficulty following commands      Exercises Total Joint Exercises Ankle Circles/Pumps: Strengthening;Both;10 reps;5 reps Quad Sets: Strengthening;Both;5 reps;10 reps Heel Slides: AAROM;Both;5 reps Hip ABduction/ADduction: AAROM;Both;10 reps Straight Leg Raises: AAROM;Both;10 reps Other Exercises: lateral scooting at the EOB with CGA and max verbal/visual cues for sequencing Other Exercises: Static sitting at the EOB for improved core therex and activity tolerance  General Comments        Pertinent Vitals/Pain Pain Assessment: No/denies pain    Home Living                      Prior Function            PT Goals (current goals can now be found in the care plan section) Progress towards PT goals: Not progressing toward goals - comment(limited by AMS/confusion)    Frequency    Min 2X/week      PT Plan Current plan remains appropriate     Co-evaluation              AM-PAC PT "6 Clicks" Mobility   Outcome Measure  Help needed turning from your back to your side while in a flat bed without using bedrails?: A Lot Help needed moving from lying on your back to sitting on the side of a flat bed without using bedrails?: A Lot Help needed moving to and from a bed to a chair (including a wheelchair)?: A Lot Help needed standing up from a chair using your arms (e.g., wheelchair or bedside chair)?: A Lot Help needed to walk in hospital room?: Total Help needed climbing 3-5 steps with a railing? : Total 6 Click Score: 10    End of Session Equipment Utilized During Treatment: Gait belt;Oxygen Activity Tolerance: Other (comment)(Pt limited by combination of confusion and weakness) Patient left: with nursing/sitter in room;in bed;Other (comment)(both rails up in bed in ICU as found) Nurse Communication: Mobility status PT Visit Diagnosis: Muscle weakness (generalized) (M62.81);Difficulty in walking, not elsewhere classified (R26.2)     Time: 0102-7253 PT Time Calculation (min) (ACUTE ONLY): 38 min  Charges:  $Therapeutic Exercise: 8-22 mins $Therapeutic Activity: 23-37 mins                     D. Scott Ameka Krigbaum PT, DPT 04/01/20, 4:05 PM

## 2020-04-01 NOTE — Progress Notes (Signed)
CRITICAL CARE NOTE  5/14 admitted for COVID pneumonia, anemia and Renal failure 5/14 8L [place], worsening renal function 5/15 very good UOP, 1500 cc's 5/16 good UOP 1200CC's 5/17- patient had episode of respiratory distress with hypoxemia, we discussed this with daughter, high risk for intubation 5/18-  Patient is hemodynamically improved.  She is with confusion and intermittent lethargy.   04/01/20- patient still with confusion, FIO2 requiremnt has improved on 60% 35L/min    CC  follow up respiratory failure Renal failure  SUBJECTIVE Prognosis is guarded 11 L [place] No SOB, resting comfortable Feels better creatinine improving H/H stable   BP (!) 147/89   Pulse 97   Temp (!) 97.3 F (36.3 C) (Axillary)   Resp 16   Ht 5\' 6"  (1.676 m)   Wt 118.5 kg   SpO2 97%   BMI 42.17 kg/m    I/O last 3 completed shifts: In: 1939.2 [P.O.:360; I.V.:133.3; Other:825; IV Piggyback:620.9] Out: 1300 [Urine:1300] No intake/output data recorded.  SpO2: 97 % O2 Flow Rate (L/min): 35 L/min FiO2 (%): 60 %  Estimated body mass index is 42.17 kg/m as calculated from the following:   Height as of this encounter: 5\' 6"  (1.676 m).   Weight as of this encounter: 118.5 kg.   Review of Systems:  Gen:  Denies  fever, sweats, chills weight loss  HEENT: Denies blurred vision, double vision, ear pain, eye pain, hearing loss, nose bleeds, sore throat Cardiac:  No dizziness, chest pain or heaviness, chest tightness,edema, No JVD Resp:   No cough, -sputum production, -shortness of breath,-wheezing, -hemoptysis,  Gi: Denies swallowing difficulty, stomach pain, nausea or vomiting, diarrhea, constipation, bowel incontinence Gu:  Denies bladder incontinence, burning urine Ext:   Denies Joint pain, stiffness or swelling Skin: Denies  skin rash, easy bruising or bleeding or hives Endoc:  Denies polyuria, polydipsia , polyphagia or weight change Psych:   Denies depression, insomnia or hallucinations  Other:   All other systems negative   Physical Examination:   General Appearance: No distress  Neuro:without focal findings,  speech normal,  HEENT: PERRLA, EOM intact.   Pulmonary: normal breath sounds, No wheezing.  CardiovascularNormal S1,S2.  No m/r/g.   Abdomen: Benign, Soft, non-tender. Renal:  No costovertebral tenderness  GU:  Not performed at this time. Endoc: No evident thyromegaly Skin:   warm, no rashes, no ecchymosis  Extremities: normal, no cyanosis, clubbing. PSYCHIATRIC: Mood, affect within normal limits.   ALL OTHER ROS ARE NEGATIVE   MEDICATIONS: I have reviewed all medications and confirmed regimen as documented   CULTURE RESULTS   Recent Results (from the past 240 hour(s))  Culture, blood (Routine x 2)     Status: None   Collection Time: 03/26/20 12:47 PM   Specimen: BLOOD  Result Value Ref Range Status   Specimen Description BLOOD BLOOD LEFT HAND  Final   Special Requests   Final    BOTTLES DRAWN AEROBIC AND ANAEROBIC Blood Culture adequate volume   Culture   Final    NO GROWTH 5 DAYS Performed at [facility], [address] [place], [address] [place]    Report Status 03/31/2020 FINAL  Final  Culture, blood (Routine x 2)     Status: None   Collection Time: 03/26/20 12:47 PM   Specimen: BLOOD  Result Value Ref Range Status   Specimen Description BLOOD LEFT ANTECUBITAL  Final   Special Requests   Final    BOTTLES DRAWN AEROBIC AND ANAEROBIC Blood Culture adequate volume  Culture   Final    NO GROWTH 5 DAYS Performed at [facility], [address] [place], [place] [address]    Report Status 03/31/2020 FINAL  Final  Urine Culture     Status: Abnormal   Collection Time: 03/26/20  1:55 PM   Specimen: Urine, Random  Result Value Ref Range Status   Specimen Description   Final    URINE, RANDOM Performed at [facility], [address] [place], [place] [address]    Special Requests   Final    NONE Performed at  [facility], [address] [place], [place] [address]    Culture >=100,000 COLONIES/mL ESCHERICHIA COLI (A)  Final   Report Status 03/29/2020 FINAL  Final   Organism ID, Bacteria ESCHERICHIA COLI (A)  Final      Susceptibility   Escherichia coli - MIC*    AMPICILLIN <=2 SENSITIVE Sensitive     CEFAZOLIN <=4 SENSITIVE Sensitive     CEFTRIAXONE <=1 SENSITIVE Sensitive     CIPROFLOXACIN <=0.25 SENSITIVE Sensitive     GENTAMICIN <=1 SENSITIVE Sensitive     IMIPENEM <=0.25 SENSITIVE Sensitive     NITROFURANTOIN <=16 SENSITIVE Sensitive     TRIMETH/SULFA <=20 SENSITIVE Sensitive     AMPICILLIN/SULBACTAM <=2 SENSITIVE Sensitive     PIP/TAZO <=4 SENSITIVE Sensitive     * >=100,000 COLONIES/mL ESCHERICHIA COLI  SARS Coronavirus 2 by RT PCR (hospital order, performed in [facility] hospital lab) Nasopharyngeal Nasopharyngeal Swab     Status: Abnormal   Collection Time: 03/26/20  2:47 PM   Specimen: Nasopharyngeal Swab  Result Value Ref Range Status   SARS Coronavirus 2 POSITIVE (A) NEGATIVE Final    Comment: RESULT CALLED TO, READ BACK BY AND VERIFIED WITH: CADY,R AT 1608 ON 03/26/2020 BY MOSLEY,J (NOTE) SARS-CoV-2 target nucleic acids are DETECTED SARS-CoV-2 RNA is generally detectable in upper respiratory specimens  during the acute phase of infection.  Positive results are indicative  of the presence of the identified virus, but do not rule out bacterial infection or co-infection with other pathogens not detected by the test.  Clinical correlation with patient history and  other diagnostic information is necessary to determine patient infection status.  The expected result is negative. Fact Sheet for Patients:   BoilerBrush.com.cy  Fact Sheet for Healthcare Providers:   https://pope.com/   This test is not yet approved or cleared by the [place] FDA and  has been authorized for detection and/or diagnosis of SARS-CoV-2  by FDA under an Emergency Use Authorization (EUA).  This EUA will remain in effect (meaning this test  can be used) for the duration of  the COVID-19 declaration under Section 564(b)(1) of the Act, 21 U.S.C. section 360-bbb-3(b)(1), unless the authorization is terminated or revoked sooner. Performed at [facility], [address] [place], [place] [address]   MRSA PCR Screening     Status: None   Collection Time: 03/26/20  9:56 PM   Specimen: Nasopharyngeal  Result Value Ref Range Status   MRSA by PCR NEGATIVE NEGATIVE Final    Comment:        The GeneXpert MRSA Assay (FDA approved for NASAL specimens only), is one component of a comprehensive MRSA colonization surveillance program. It is not intended to diagnose MRSA infection nor to guide or monitor treatment for MRSA infections. Performed at [facility], [address] [place], [place] [address]           IMAGING  DG Chest Port 1 View  Result Date: 04/01/2020 CLINICAL DATA:  Acute respiratory failure.  COVID-19 positive. EXAM: PORTABLE CHEST 1 VIEW COMPARISON:  03/30/2020 FINDINGS: Right-sided PICC line has tip overlying the SVC. Lungs are hypoinflated with persistent patchy bilateral airspace opacification with slight overall improvement compatible with known multifocal pneumonia. Suggestion of a small right pleural effusion with associated basilar atelectasis which is new. Cardiomediastinal silhouette and remainder of the exam is unchanged. IMPRESSION: 1. Hypoinflation with persistent multifocal airspace process with slight overall improvement likely multifocal pneumonia. New small right pleural effusion with mild associated basilar atelectasis. 2.  Right-sided PICC line with tip over the SVC. Electronically Signed   By: Marin Olp M.D.   On: 04/01/2020 07:15     Nutrition Status: Nutrition Problem: Increased nutrient needs Etiology: catabolic ONGEXBM([id number]) Signs/Symptoms: estimated  needs Interventions: Ensure Enlive (each supplement provides 350kcal and 20 grams of protein), Magic cup     ASSESSMENT AND PLAN   COVID-19 pneumonia infection-pneumonia/pneumonitis IV steroids -will hold today due to confusion IV remdisivir-done day 5 -continue Ivermectin for 5 d Aggressive pulm toilet  OOB to chair as tolerated  Pulmonary hygiene Continue proning as tolerated due to severe hypoxia   Maintain airborne and contact precautions  As needed bronchodilators (MDI) Vitamin C and zinc Antitussives   Altered mental status with confusion  - possible ICU delerium vs steroid induced vs uremic encephalopathy  - will dc/ steroids  - G6PD testing to administer rasburicase    -Urate >10    Morbid obesity, possible OSA.   Will certainly impact respiratory mechanics   ACUTE KIDNEY INJURY/Renal Failure -continue Foley Catheter-assess need -Avoid nephrotoxic agents-dcd protonix -Follow urine output, BMP -Ensure adequate renal perfusion, optimize oxygenation -Renal dose medications   CARDIAC ICU monitoring  ID -continue IV abx as prescibed -follow up cultures  GI GI PROPHYLAXIS as indicated  NUTRITIONAL STATUS Nutrition Status: Nutrition Problem: Increased nutrient needs Etiology: catabolic WNUUVOZ([id number]) Signs/Symptoms: estimated needs Interventions: Ensure Enlive (each supplement provides 350kcal and 20 grams of protein), Magic cup   DIET-->as tolerated Constipation protocol as indicated  ENDO - will use ICU hypoglycemic\Hyperglycemia protocol if indicated     ELECTROLYTES -follow labs as needed -replace as needed -pharmacy consultation and following   DVT/GI PRX ordered and assessed TRANSFUSIONS AS NEEDED MONITOR FSBS  CASE DISCUSSED IN [place] WITH ICU TEAM  Critical care provider statement:    Critical care time (minutes):  34   Critical care time was exclusive of:  Separately billable procedures and  treating  other patients   Critical care was necessary to treat or prevent imminent or  life-threatening deterioration of the following conditions:  acute hypoxemic respiratory failure due to [phone]    Critical care was time spent personally by me on the following  activities:  Development of treatment plan with patient or surrogate,  discussions with consultants, evaluation of patient's response to  treatment, examination of patient, obtaining history from patient or  surrogate, ordering and performing treatments and interventions, ordering  and review of laboratory studies and re-evaluation of patient's condition   I assumed direction of critical care for this patient from another  provider in my specialty: no      Ottie Glazier, M.D.  Pulmonary & [place]

## 2020-04-02 ENCOUNTER — Inpatient Hospital Stay: Payer: Medicaid Other

## 2020-04-02 LAB — COMPREHENSIVE METABOLIC PANEL
ALT: 16 U/L (ref 0–44)
AST: 34 U/L (ref 15–41)
Albumin: 2.1 g/dL — ABNORMAL LOW (ref 3.5–5.0)
Alkaline Phosphatase: 64 U/L (ref 38–126)
Anion gap: 8 (ref 5–15)
BUN: 30 mg/dL — ABNORMAL HIGH (ref 6–20)
CO2: 33 mmol/L — ABNORMAL HIGH (ref 22–32)
Calcium: 8 mg/dL — ABNORMAL LOW (ref 8.9–10.3)
Chloride: 105 mmol/L (ref 98–111)
Creatinine, Ser: 1.64 mg/dL — ABNORMAL HIGH (ref 0.44–1.00)
GFR calc Af Amer: 43 mL/min — ABNORMAL LOW (ref >60)
GFR calc non Af Amer: 37 mL/min — ABNORMAL LOW (ref >60)
Glucose, Bld: 100 mg/dL — ABNORMAL HIGH (ref 70–99)
Potassium: 3.6 mmol/L (ref 3.5–5.1)
Sodium: 146 mmol/L — ABNORMAL HIGH (ref 135–145)
Total Bilirubin: 0.4 mg/dL (ref 0.3–1.2)
Total Protein: 6.2 g/dL — ABNORMAL LOW (ref 6.5–8.1)

## 2020-04-02 LAB — PHOSPHORUS: Phosphorus: 2.6 mg/dL (ref 2.5–4.6)

## 2020-04-02 LAB — PREPARE RBC (CROSSMATCH)

## 2020-04-02 LAB — MAGNESIUM: Magnesium: 1.7 mg/dL (ref 1.7–2.4)

## 2020-04-02 LAB — FIBRIN DERIVATIVES D-DIMER (ARMC ONLY): Fibrin derivatives D-dimer (ARMC): 843.04 ng/mL (FEU) — ABNORMAL HIGH (ref 0.00–499.00)

## 2020-04-02 LAB — CBC
HCT: 22.9 % — ABNORMAL LOW (ref 36.0–46.0)
Hemoglobin: 6.7 g/dL — ABNORMAL LOW (ref 12.0–15.0)
MCH: 20.6 pg — ABNORMAL LOW (ref 26.0–34.0)
MCHC: 29.3 g/dL — ABNORMAL LOW (ref 30.0–36.0)
MCV: 70.5 fL — ABNORMAL LOW (ref 80.0–100.0)
Platelets: 556 10*3/uL — ABNORMAL HIGH (ref 150–400)
RBC: 3.25 MIL/uL — ABNORMAL LOW (ref 3.87–5.11)
RDW: 23.1 % — ABNORMAL HIGH (ref 11.5–15.5)
WBC: 15.6 10*3/uL — ABNORMAL HIGH (ref 4.0–10.5)
nRBC: 0.1 % (ref 0.0–0.2)

## 2020-04-02 LAB — BLOOD GAS, ARTERIAL
Acid-Base Excess: 9 mmol/L — ABNORMAL HIGH (ref 0.0–2.0)
Bicarbonate: 33.5 mmol/L — ABNORMAL HIGH (ref 20.0–28.0)
FIO2: 0.5
O2 Saturation: 92.9 %
Patient temperature: 38
pCO2 arterial: 46 mmHg (ref 32.0–48.0)
pH, Arterial: 7.47 — ABNORMAL HIGH (ref 7.350–7.450)
pO2, Arterial: 65 mmHg — ABNORMAL LOW (ref 83.0–108.0)

## 2020-04-02 LAB — GLUCOSE 6 PHOSPHATE DEHYDROGENASE
G6PDH: 19.5 U/g{Hb} — ABNORMAL HIGH (ref 4.7–14.6)
Hemoglobin: 7.3 g/dL — ABNORMAL LOW (ref 11.1–15.9)

## 2020-04-02 LAB — HEMOGLOBIN AND HEMATOCRIT, BLOOD
HCT: 23.9 % — ABNORMAL LOW (ref 36.0–46.0)
Hemoglobin: 7.2 g/dL — ABNORMAL LOW (ref 12.0–15.0)

## 2020-04-02 LAB — C-REACTIVE PROTEIN: CRP: 14.9 mg/dL — ABNORMAL HIGH (ref <1.0)

## 2020-04-02 MED ORDER — MAGNESIUM SULFATE 2 GM/50ML IV SOLN
2.0000 g | Freq: Once | INTRAVENOUS | Status: AC
  Administered 2020-04-02: 2 g via INTRAVENOUS
  Filled 2020-04-02: qty 50

## 2020-04-02 MED ORDER — POLYETHYLENE GLYCOL 3350 17 G PO PACK
17.0000 g | PACK | Freq: Every day | ORAL | Status: DC
  Administered 2020-04-03 – 2020-04-07 (×5): 17 g via ORAL
  Filled 2020-04-02 (×9): qty 1

## 2020-04-02 NOTE — Progress Notes (Signed)
[facility]  ROUNDING NOTE   Subjective:   Creatinine 1.64 (1.85) (2.17) (2.86) (3.92)  D5W at 71mL/hr  Continues to have confusion   Objective:  Vital signs in last 24 hours:  Temp:  [98.2 F (36.8 C)-98.5 F (36.9 C)] 98.2 F (36.8 C) (05/20 0400) Pulse Rate:  [94-113] 95 (05/20 0500) Resp:  [18-24] 23 (05/20 0500) BP: (119-176)/(75-151) 145/84 (05/20 0500) SpO2:  [93 %-98 %] 96 % (05/20 0500) FiO2 (%):  [50 %-60 %] 50 % (05/19 2100) Weight:  [117.4 kg] 117.4 kg (05/20 0252)  Weight change: -1.1 kg Filed Weights   03/30/20 0411 04/01/20 0500 04/02/20 0252  Weight: 119.7 kg 118.5 kg 117.4 kg    Intake/Output: I/O last 3 completed shifts: In: 2740.8 [I.V.:1565.8; UMPNT:614; IV Piggyback:350] Out: 1650 [Urine:1650]   Intake/Output this shift:  No intake/output data recorded.  Physical Exam:  Not examined                                   Basic Metabolic Panel: Recent Labs  Lab 03/29/20 0514 03/29/20 0514 03/30/20 0423 03/30/20 0423 03/31/20 0558 04/01/20 0457 04/02/20 0332  NA 144  --  148*  --  149* 149* 146*  K 4.4  --  3.4*  --  3.3* 3.6 3.6  CL 104  --  101  --  103 105 105  CO2 26  --  32  --  35* 34* 33*  GLUCOSE 140*  --  131*  --  117* 129* 100*  BUN 83*  --  76*  --  60* 46* 30*  CREATININE 3.92*  --  2.86*  --  2.17* 1.85* 1.64*  CALCIUM 7.7*   < > 8.3*   < > 8.1* 8.4* 8.0*  MG 1.6*  --  2.2  --  2.1  --  1.7  PHOS 4.0  --   --   --   --   --  2.6   < > = values in this interval not displayed.    Liver Function Tests: Recent Labs  Lab 03/29/20 0514 03/30/20 0423 03/31/20 0558 04/01/20 0457 04/02/20 0332  AST 57* 60* 52* 40 34  ALT 26 29 24 21 16   ALKPHOS 54 58 58 62 64  BILITOT 0.7 0.4 0.4 0.5 0.4  PROT 6.4* 6.7 6.1* 6.6 6.2*  ALBUMIN 2.4* 2.6* 2.4* 2.2* 2.1*   No results for input(s): LIPASE, AMYLASE in the last 168 hours. Recent Labs  Lab 03/31/20 1200  AMMONIA 19    CBC: Recent Labs  Lab  03/26/20 1247 03/27/20 0538 03/29/20 0514 03/30/20 0423 03/31/20 0558 04/01/20 0457 04/02/20 0332  WBC 6.2   < > 9.2 10.2 12.3* 19.5* 15.6*  NEUTROABS 4.6  --   --   --   --   --   --   HGB 6.9*   < > 8.0* 8.3* 7.1* 7.2* 6.7*  HCT 23.0*   < > 24.7* 26.5* 24.0* 24.9* 22.9*  MCV 65.0*   < > 63.7* 64.8* 69.2* 70.5* 70.5*  PLT 473*   < > 610* 732* 645* 572* 556*   < > = values in this interval not displayed.    Cardiac Enzymes: No results for input(s): CKTOTAL, CKMB, CKMBINDEX, TROPONINI in the last 168 hours.  BNP: Invalid input(s): POCBNP  CBG: Recent Labs  Lab 03/26/20 1617 03/28/20 0851 03/28/20 1620 03/29/20 1145 03/30/20 2050  GLUCAP  94 145* 170* 143* 114*    Microbiology: Results for orders placed or performed during the hospital encounter of 03/26/20  Culture, blood (Routine x 2)     Status: None   Collection Time: 03/26/20 12:47 PM   Specimen: BLOOD  Result Value Ref Range Status   Specimen Description BLOOD BLOOD LEFT HAND  Final   Special Requests   Final    BOTTLES DRAWN AEROBIC AND ANAEROBIC Blood Culture adequate volume   Culture   Final    NO GROWTH 5 DAYS Performed at [facility], [address] [place], [place] [address]    Report Status 03/31/2020 FINAL  Final  Culture, blood (Routine x 2)     Status: None   Collection Time: 03/26/20 12:47 PM   Specimen: BLOOD  Result Value Ref Range Status   Specimen Description BLOOD LEFT ANTECUBITAL  Final   Special Requests   Final    BOTTLES DRAWN AEROBIC AND ANAEROBIC Blood Culture adequate volume   Culture   Final    NO GROWTH 5 DAYS Performed at [facility], [address] [place], [place] [address]    Report Status 03/31/2020 FINAL  Final  Urine Culture     Status: Abnormal   Collection Time: 03/26/20  1:55 PM   Specimen: Urine, Random  Result Value Ref Range Status   Specimen Description   Final    URINE, RANDOM Performed at [facility], [address]  [place], [place] [address]    Special Requests   Final    NONE Performed at [facility], [address] [place], [place] [address]    Culture >=100,000 COLONIES/mL ESCHERICHIA COLI (A)  Final   Report Status 03/29/2020 FINAL  Final   Organism ID, Bacteria ESCHERICHIA COLI (A)  Final      Susceptibility   Escherichia coli - MIC*    AMPICILLIN <=2 SENSITIVE Sensitive     CEFAZOLIN <=4 SENSITIVE Sensitive     CEFTRIAXONE <=1 SENSITIVE Sensitive     CIPROFLOXACIN <=0.25 SENSITIVE Sensitive     GENTAMICIN <=1 SENSITIVE Sensitive     IMIPENEM <=0.25 SENSITIVE Sensitive     NITROFURANTOIN <=16 SENSITIVE Sensitive     TRIMETH/SULFA <=20 SENSITIVE Sensitive     AMPICILLIN/SULBACTAM <=2 SENSITIVE Sensitive     PIP/TAZO <=4 SENSITIVE Sensitive     * >=100,000 COLONIES/mL ESCHERICHIA COLI  SARS Coronavirus 2 by RT PCR (hospital order, performed in [facility] hospital lab) Nasopharyngeal Nasopharyngeal Swab     Status: Abnormal   Collection Time: 03/26/20  2:47 PM   Specimen: Nasopharyngeal Swab  Result Value Ref Range Status   SARS Coronavirus 2 POSITIVE (A) NEGATIVE Final    Comment: RESULT CALLED TO, READ BACK BY AND VERIFIED WITH: CADY,R AT 1608 ON 03/26/2020 BY MOSLEY,J (NOTE) SARS-CoV-2 target nucleic acids are DETECTED SARS-CoV-2 RNA is generally detectable in upper respiratory specimens  during the acute phase of infection.  Positive results are indicative  of the presence of the identified virus, but do not rule out bacterial infection or co-infection with other pathogens not detected by the test.  Clinical correlation with patient history and  other diagnostic information is necessary to determine patient infection status.  The expected result is negative. Fact Sheet for Patients:   BoilerBrush.com.cy  Fact Sheet for Healthcare Providers:   https://pope.com/   This test is not yet approved or cleared by the [place] FDA  and  has been authorized for detection and/or diagnosis  of SARS-CoV-2 by FDA under an Emergency Use Authorization (EUA).  This EUA will remain in effect (meaning this test  can be used) for the duration of  the COVID-19 declaration under Section 564(b)(1) of the Act, 21 U.S.C. section 360-bbb-3(b)(1), unless the authorization is terminated or revoked sooner. Performed at [facility], [address] [place], [place] [address]   MRSA PCR Screening     Status: None   Collection Time: 03/26/20  9:56 PM   Specimen: Nasopharyngeal  Result Value Ref Range Status   MRSA by PCR NEGATIVE NEGATIVE Final    Comment:        The GeneXpert MRSA Assay (FDA approved for NASAL specimens only), is one component of a comprehensive MRSA colonization surveillance program. It is not intended to diagnose MRSA infection nor to guide or monitor treatment for MRSA infections. Performed at [facility], [address] [place], [place] [address]     Coagulation Studies: No results for input(s): LABPROT, INR in the last 72 hours.  Urinalysis: No results for input(s): COLORURINE, LABSPEC, PHURINE, GLUCOSEU, HGBUR, BILIRUBINUR, KETONESUR, PROTEINUR, UROBILINOGEN, NITRITE, LEUKOCYTESUR in the last 72 hours.  Invalid input(s): APPERANCEUR    Imaging: DG Chest Port 1 View  Result Date: 04/01/2020 CLINICAL DATA:  Acute respiratory failure.  COVID-19 positive. EXAM: PORTABLE CHEST 1 VIEW COMPARISON:  03/30/2020 FINDINGS: Right-sided PICC line has tip overlying the SVC. Lungs are hypoinflated with persistent patchy bilateral airspace opacification with slight overall improvement compatible with known multifocal pneumonia. Suggestion of a small right pleural effusion with associated basilar atelectasis which is new. Cardiomediastinal silhouette and remainder of the exam is unchanged. IMPRESSION: 1. Hypoinflation with persistent multifocal airspace process with slight overall improvement  likely multifocal pneumonia. New small right pleural effusion with mild associated basilar atelectasis. 2.  Right-sided PICC line with tip over the SVC. Electronically Signed   By: Elberta Fortis M.D.   On: 04/01/2020 07:15     Medications:   . sodium chloride    . dextrose 75 mL/hr at 04/02/20 0512  . magnesium sulfate bolus IVPB     . sodium chloride   Intravenous Once  . aerochamber plus with mask  1 each Other Once  . albuterol  1 puff Inhalation Q4H  . B-complex with vitamin C  1 tablet Oral Daily  . buprenorphine-naloxone  1 tablet Sublingual Daily  . Chlorhexidine Gluconate Cloth  6 each Topical Daily  . ciclesonide  4 puff Inhalation BID  . enoxaparin (LOVENOX) injection  40 mg Subcutaneous Q24H  . feeding supplement (ENSURE ENLIVE)  237 mL Oral TID BM  . ipratropium  2 puff Inhalation Q4H  . ivermectin  150 mcg/kg Oral Daily  . labetalol  300 mg Oral BID  . sodium chloride flush  10-40 mL Intracatheter Q12H  . sodium chloride flush  3 mL Intravenous Q12H   sodium chloride, acetaminophen, docusate sodium, labetalol, lip balm, ondansetron (ZOFRAN) IV, polyethylene glycol, sodium chloride flush, sodium chloride flush  Assessment/ Plan:  Diane Keller is a 44 y.o. black female with history of anxiety, GERD, prior history of CVA who presents with COVID-19 infection and acute kidney injury.  1.  Acute kidney injury secondary to acute tubular necrosis.Baseline creatinine of 0.87 in 2018.  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 Nonoliguric urine output Creatinine improving.   2. Hypertension: 145/84.  - labetalol   3. Hypernatremia and hypokalemia: improved - Dextrose infusion -  encourage PO intake.   5. Anemia with renal failure: hemoglobin 6.7. Status post PRBC transfusion on 5/15.  - Recommend PRBC transfusion for today.   6. Thrombocytosis: with elevated D-dimer. Lower  extremity dopplers are negative.  Platelet counts are trending down.   7. COVID-19 pneumonia on remdesivir, azithro, ceftriaxone, and dexamethazone. Requiring HFNC Appreciate pulmonary input.   6. Urinary Tract Infection: E. Coli - treated with ceftriaxone.    LOS: 7 Diane Keller 5/20/20218:42 AM

## 2020-04-02 NOTE — Progress Notes (Signed)
CRITICAL CARE NOTE  5/14 admitted for COVID pneumonia, anemia and Renal failure 5/14 8L [place], worsening renal function 5/15 very good UOP, 1500 cc's 5/16 good UOP 1200CC's 5/17- patient had episode of respiratory distress with hypoxemia, we discussed this with daughter, high risk for intubation 5/18-  Patient is hemodynamically improved.  She is with confusion and intermittent lethargy.   04/01/20- patient still with confusion, FIO2 requiremnt has improved on 60% 35L/min 04/02/20 - patient remains with intermittent confusion, she seems to have component of icu delerium and possible metabolic encephalopathy   CC  follow up respiratory failure Renal failure  SUBJECTIVE Prognosis is guarded 11 L [place] No SOB, resting comfortable Feels better creatinine improving H/H stable   BP 137/85   Pulse 99   Temp 100.3 F (37.9 C) (Oral)   Resp 20   Ht 5\' 6"  (1.676 m)   Wt 117.4 kg   SpO2 95%   BMI 41.77 kg/m    I/O last 3 completed shifts: In: 2740.8 [I.V.:1565.8; JOINO:676; IV Piggyback:350] Out: 7209 [Urine:1650] No intake/output data recorded.  SpO2: 95 % O2 Flow Rate (L/min): 35 L/min FiO2 (%): 50 %  Estimated body mass index is 41.77 kg/m as calculated from the following:   Height as of this encounter: 5\' 6"  (1.676 m).   Weight as of this encounter: 117.4 kg.   Review of Systems:  Gen:  Denies  fever, sweats, chills weight loss  HEENT: Denies blurred vision, double vision, ear pain, eye pain, hearing loss, nose bleeds, sore throat Cardiac:  No dizziness, chest pain or heaviness, chest tightness,edema, No JVD Resp:   No cough, -sputum production, +shortness of breath,-wheezing, -hemoptysis,  Gi: Denies swallowing difficulty, stomach pain, nausea or vomiting, diarrhea, constipation, bowel incontinence Gu:  Denies bladder incontinence, burning urine Ext:   Denies Joint pain, stiffness or swelling Skin: Denies  skin rash, easy bruising or bleeding or hives Endoc:  Denies  polyuria, polydipsia , polyphagia or weight change Psych:   Denies depression, insomnia or hallucinations  Other:  All other systems negative   Physical Examination:   General Appearance: No distress  Neuro:without focal findings,  speech normal, confusion+ HEENT: PERRLA, EOM intact.   Pulmonary: normal breath sounds, No wheezing.  CardiovascularNormal S1,S2.  No m/r/g.   Abdomen: Benign, Soft, non-tender. GU:  Not performed at this time. Endoc: No evident thyromegaly Skin:   warm, no rashes, no ecchymosis  Extremities: normal, no cyanosis, clubbing. PSYCHIATRIC: Mood, affect within normal limits.+confusion    MEDICATIONS: I have reviewed all medications and confirmed regimen as documented   CULTURE RESULTS   Recent Results (from the past 240 hour(s))  Culture, blood (Routine x 2)     Status: None   Collection Time: 03/26/20 12:47 PM   Specimen: BLOOD  Result Value Ref Range Status   Specimen Description BLOOD BLOOD LEFT HAND  Final   Special Requests   Final    BOTTLES DRAWN AEROBIC AND ANAEROBIC Blood Culture adequate volume   Culture   Final    NO GROWTH 5 DAYS Performed at [facility] [address], [place], [place] [address]    Report Status 03/31/2020 FINAL  Final  Culture, blood (Routine x 2)     Status: None   Collection Time: 03/26/20 12:47 PM   Specimen: BLOOD  Result Value Ref Range Status   Specimen Description BLOOD LEFT ANTECUBITAL  Final   Special Requests   Final    BOTTLES DRAWN AEROBIC AND ANAEROBIC Blood Culture  adequate volume   Culture   Final    NO GROWTH 5 DAYS Performed at [facility], [address] [place], [place] [address]    Report Status 03/31/2020 FINAL  Final  Urine Culture     Status: Abnormal   Collection Time: 03/26/20  1:55 PM   Specimen: Urine, Random  Result Value Ref Range Status   Specimen Description   Final    URINE, RANDOM Performed at [facility], [address] [place],  [place] [address]    Special Requests   Final    NONE Performed at [facility], [address] [place], [place] [address]    Culture >=100,000 COLONIES/mL ESCHERICHIA COLI (A)  Final   Report Status 03/29/2020 FINAL  Final   Organism ID, Bacteria ESCHERICHIA COLI (A)  Final      Susceptibility   Escherichia coli - MIC*    AMPICILLIN <=2 SENSITIVE Sensitive     CEFAZOLIN <=4 SENSITIVE Sensitive     CEFTRIAXONE <=1 SENSITIVE Sensitive     CIPROFLOXACIN <=0.25 SENSITIVE Sensitive     GENTAMICIN <=1 SENSITIVE Sensitive     IMIPENEM <=0.25 SENSITIVE Sensitive     NITROFURANTOIN <=16 SENSITIVE Sensitive     TRIMETH/SULFA <=20 SENSITIVE Sensitive     AMPICILLIN/SULBACTAM <=2 SENSITIVE Sensitive     PIP/TAZO <=4 SENSITIVE Sensitive     * >=100,000 COLONIES/mL ESCHERICHIA COLI  SARS Coronavirus 2 by RT PCR (hospital order, performed in [facility] hospital lab) Nasopharyngeal Nasopharyngeal Swab     Status: Abnormal   Collection Time: 03/26/20  2:47 PM   Specimen: Nasopharyngeal Swab  Result Value Ref Range Status   SARS Coronavirus 2 POSITIVE (A) NEGATIVE Final    Comment: RESULT CALLED TO, READ BACK BY AND VERIFIED WITH: CADY,R AT 1608 ON 03/26/2020 BY MOSLEY,J (NOTE) SARS-CoV-2 target nucleic acids are DETECTED SARS-CoV-2 RNA is generally detectable in upper respiratory specimens  during the acute phase of infection.  Positive results are indicative  of the presence of the identified virus, but do not rule out bacterial infection or co-infection with other pathogens not detected by the test.  Clinical correlation with patient history and  other diagnostic information is necessary to determine patient infection status.  The expected result is negative. Fact Sheet for Patients:   BoilerBrush.com.cy  Fact Sheet for Healthcare Providers:   https://pope.com/   This test is not yet approved or cleared by the [place] FDA and  has  been authorized for detection and/or diagnosis of SARS-CoV-2 by FDA under an Emergency Use Authorization (EUA).  This EUA will remain in effect (meaning this test  can be used) for the duration of  the COVID-19 declaration under Section 564(b)(1) of the Act, 21 U.S.C. section 360-bbb-3(b)(1), unless the authorization is terminated or revoked sooner. Performed at [facility], [address] [place], [place] [address]   MRSA PCR Screening     Status: None   Collection Time: 03/26/20  9:56 PM   Specimen: Nasopharyngeal  Result Value Ref Range Status   MRSA by PCR NEGATIVE NEGATIVE Final    Comment:        The GeneXpert MRSA Assay (FDA approved for NASAL specimens only), is one component of a comprehensive MRSA colonization surveillance program. It is not intended to diagnose MRSA infection nor to guide or monitor treatment for MRSA infections. Performed at [facility], [address], [place], [place] [address]  IMAGING    No results found.   Nutrition Status: Nutrition Problem: Increased nutrient needs Etiology: catabolic illness(COVID-19) Signs/Symptoms: estimated needs Interventions: Ensure Enlive (each supplement provides 350kcal and 20 grams of protein), Magic cup     ASSESSMENT AND PLAN   COVID-19 pneumonia infection-pneumonia/pneumonitis IV steroids -will hold today due to confusion IV remdisivir-done day 5 -continue Ivermectin for 5 d Aggressive pulm toilet  OOB to chair as tolerated  Pulmonary hygiene Continue proning as tolerated due to severe hypoxia   Maintain airborne and contact precautions  As needed bronchodilators (MDI) Vitamin C and zinc Antitussives   Altered mental status with confusion  - possible ICU delerium vs steroid induced vs uremic encephalopathy  - will dc/ steroids  - G6PD testing to administer rasburicase    -Urate >10    Morbid obesity, possible OSA.   Will certainly impact respiratory  mechanics   ACUTE KIDNEY INJURY/Renal Failure -continue Foley Catheter-assess need -Avoid nephrotoxic agents-dcd protonix -Follow urine output, BMP -Ensure adequate renal perfusion, optimize oxygenation -Renal dose medications   CARDIAC ICU monitoring  ID -continue IV abx as prescibed -follow up cultures  GI GI PROPHYLAXIS as indicated  NUTRITIONAL STATUS Nutrition Status: Nutrition Problem: Increased nutrient needs Etiology: catabolic illness(COVID-19) Signs/Symptoms: estimated needs Interventions: Ensure Enlive (each supplement provides 350kcal and 20 grams of protein), Magic cup   DIET-->as tolerated Constipation protocol as indicated  ENDO - will use ICU hypoglycemic\Hyperglycemia protocol if indicated     ELECTROLYTES -follow labs as needed -replace as needed -pharmacy consultation and following   DVT/GI PRX ordered and assessed TRANSFUSIONS AS NEEDED MONITOR FSBS  CASE DISCUSSED IN MULTIDISCIPLINARY ROUNDS WITH ICU TEAM  Critical care provider statement:    Critical care time (minutes):  34   Critical care time was exclusive of:  Separately billable procedures and  treating other patients   Critical care was necessary to treat or prevent imminent or  life-threatening deterioration of the following conditions:  acute hypoxemic respiratory failure due to [phone]    Critical care was time spent personally by me on the following  activities:  Development of treatment plan with patient or surrogate,  discussions with consultants, evaluation of patient's response to  treatment, examination of patient, obtaining history from patient or  surrogate, ordering and performing treatments and interventions, ordering  and review of laboratory studies and re-evaluation of patient's condition   I assumed direction of critical care for this patient from another  provider in my specialty: no      Vida Rigger, M.D.  Pulmonary & Critical Care Medicine  [facility]

## 2020-04-02 NOTE — Progress Notes (Signed)
Pt with increased lethargy and confusion. Pt A&O X1 at this time. Pt refused and became combative with NG tube insertion attempt. Pt ate 25% of meal with strong encouragement and full supervision. Pt falls asleep in between swallowing and has to be told to swallow. Md and family notified. Head CT was ordered and performed. Awaiting results. Will continue to monitor.

## 2020-04-02 NOTE — Progress Notes (Signed)
Nutrition Follow-up  DOCUMENTATION CODES:   Morbid obesity  INTERVENTION:  Continue Ensure Enlive po TID, each supplement provides 350 kcal and 20 grams of protein.  Continue Magic cup TID with meals, each supplement provides 290 kcal and 9 grams of protein.  Recommend placement of small-bore NGT (Dobbhoff) for initiation of tube feeds if this aligns with patient's goals of care.  If plan is for tube feeds recommend initiating Vital 1.5 Cal at 15 mL/hr and advancing by 25 mL/hr every 8 hours to goal rate of 65 mL/hr. Also provide Pro-Stat 30 mL once daily per tube. Goal TF regimen provides 2440 kcal, 120 grams of protein, 1186 mL H2O daily.  Monitor magnesium, potassium, and phosphorus daily for at least 3 days, MD to replete as needed, as pt is at risk for refeeding syndrome given prolonged poor PO intake.  NUTRITION DIAGNOSIS:   Increased nutrient needs related to catabolic OACZYSA(YTKZS-01) as evidenced by estimated needs.  Ongoing.  GOAL:   Patient will meet greater than or equal to 90% of their needs  Not met.  MONITOR:   PO intake, Supplement acceptance, Labs, Weight trends, I & O's  REASON FOR ASSESSMENT:   Consult Assessment of nutrition requirement/status  ASSESSMENT:   44 year old female with PMHx of GERD, anxiety, hx CVA 2018 admitted with COVID PNA and renal failure.  Discussed with RN on floor. Patient remains confused. She is not eating well at meals. She eats 0% to bites. She took one bite of breakfast today and only a few sips of Ensure. Patient has now had 7 days of poor PO intake this admission. She also was not eating well prior to admission. Discussed on rounds. Plan is to discuss with patient/family regarding placement of NGT.  Medications reviewed and include: B-complex with C 1 tablet daily, D5W at 75 mL/hr, magnesium sulfate 2 grams IV once today.  Labs reviewed: Sodium 146, CO2 33, BUN 30, Creatinine 1.64.  Diet Order:   Diet Order            Diet regular Room service appropriate? Yes; Fluid consistency: Thin  Diet effective now             EDUCATION NEEDS:   No education needs have been identified at this time  Skin:  Skin Assessment: Reviewed RN Assessment  Last BM:  Unknown  Height:   Ht Readings from Last 1 Encounters:  03/26/20 _0  (1.676 m)   Weight:   Wt Readings from Last 1 Encounters:  04/02/20 117.4 kg   BMI:  Body mass index is 41.77 kg/m.  Estimated Nutritional Needs:   Kcal:  2300-2500  Protein:  115-125 grams  Fluid:  >/= 2 L/day  Jacklynn Barnacle, MS, RD, LDN Pager number available on Amion

## 2020-04-02 NOTE — Progress Notes (Addendum)
Pharmacy Electrolyte Monitoring Consult:  Pharmacy consulted to assist in monitoring and replacing electrolytes in this 44 y.o. female admitted on 03/26/2020 with COVID-19 infection and AKI. Patient is at risk for refeeding syndrome.   Labs:  Sodium (mmol/L)  Date Value  04/02/2020 146 (H)  05/11/2012 140   Potassium (mmol/L)  Date Value  04/02/2020 3.6  05/11/2012 3.2 (L)   Magnesium (mg/dL)  Date Value  95/63/8756 1.7   Phosphorus (mg/dL)  Date Value  43/32/9518 2.6   Calcium (mg/dL)  Date Value  84/16/6063 8.0 (L)   Calcium, Total (mg/dL)  Date Value  01/60/1093 9.2   Albumin (g/dL)  Date Value  23/55/7322 2.1 (L)  05/11/2012 3.7   Assessment/Plan:  Electrolytes:  Goal for electrolytes WNL.   Currently on D5 @75  mL/h for hypernatremia.  Mg on the lower end of normal, patient received Mg sulfate 2g IV x1 dose this AM for replacement.  Will continue to monitor with AM labs and replace as indicated.   Glucose: SBG 100 this AM. Patient is not eating well at meals, refer to RD note. Patient remains off of IV steroids. Will continue to monitor glucose trend.  Constipation: LBM 5/17. Patient is not eating well which might be a contributing factor. Added scheduled Miralax PO Qday. Will continue to monitor.   Pharmacy will continue to monitor and adjust per consult.    6/17, PharmD Candidate 04/02/2020 11:10 AM

## 2020-04-02 NOTE — Progress Notes (Signed)
Assisted tele visit to patient with family members.  Suman Trivedi Anderson, RN   

## 2020-04-02 NOTE — TOC Initial Note (Addendum)
Transition of Care [facility]) - Initial/Assessment Note    Patient Details  Name: Diane Keller MRN: [id number] Date of Birth: Dec 22, 1975  [facility]) CM/SW Contact:    Liliana Cline, LCSW Phone Number: 04/02/2020, 1:54 PM  Clinical Narrative:               Patient is on Airborne Precautions and HFNC with intermittent confusion per notes. CSW called patient's husband, Earl Lites. Earl Lites reported patient was independent at baseline. CSW informed Earl Lites that the current PT recommendation is SNF at discharge once patient is medically stable and ready for discharge. Earl Lites reported they would be agreeable to SNF. He reported he does not have a SNF preference but would like to do some research and let CSW know what their preference would be. CSW informed Earl Lites that since patient is insured by Medicaid, she would have to stay 30 days for Medicaid to cover the stay. He verbalized understanding. He reported patient does not receive SSI or SSDI. Earl Lites reported they would also be agreeable to Home Health if the recommendation changes to that at discharge. He denied an agency preference. CSW will continue to follow.   Expected Discharge Plan: Skilled Nursing Facility Barriers to Discharge: Continued Medical Work up   Patient Goals and CMS Choice   CMS Medicare.gov Compare Post Acute Care list provided to:: Patient Represenative (must comment) Choice offered to / list presented to : Spouse  Expected Discharge Plan and Services Expected Discharge Plan: Skilled Nursing Facility       Living arrangements for the past 2 months: Single Family Home                                      Prior Living Arrangements/Services Living arrangements for the past 2 months: Single Family Home Lives with:: Spouse Patient language and need for interpreter reviewed:: Yes        Need for Family Participation in Patient Care: Yes (Comment) Care giver support system in place?: Yes  (comment)   Criminal Activity/Legal Involvement Pertinent to Current Situation/Hospitalization: No - Comment as needed  Activities of Daily Living      Permission Sought/Granted Permission sought to share information with : Oceanographer granted to share information with : Yes, Verbal Permission Granted(permission granted by spouse)     Permission granted to share info w AGENCY: Home Health Agencies; SNFs        Emotional Assessment         Alcohol / Substance Use: Not Applicable Psych Involvement: No (comment)  Admission diagnosis:  Renal failure [N19] Acute respiratory failure with hypoxia (HCC) [J96.01] Severe sepsis (HCC) [A41.9, R65.20] Acute renal failure, unspecified acute renal failure type (HCC) [N17.9] Acute anemia [D64.9] Patient Active Problem List   Diagnosis Date Noted  . Renal failure 03/26/2020  . Essential hypertension 06/08/2017  . Heartburn 06/08/2017  . Hypertensive emergency 05/12/2017  . Morbid obesity (HCC) 05/12/2017  . 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 [place] [address] Phone: [phone] Fax: [phone]     Social Determinants of Health (SDOH) Interventions    Readmission Risk Interventions No flowsheet data found.

## 2020-04-03 DIAGNOSIS — J069 Acute upper respiratory infection, unspecified: Secondary | ICD-10-CM

## 2020-04-03 DIAGNOSIS — G9349 Other encephalopathy: Secondary | ICD-10-CM

## 2020-04-03 LAB — CBC
HCT: 24.3 % — ABNORMAL LOW (ref 36.0–46.0)
Hemoglobin: 7.3 g/dL — ABNORMAL LOW (ref 12.0–15.0)
MCH: 21.5 pg — ABNORMAL LOW (ref 26.0–34.0)
MCHC: 30 g/dL (ref 30.0–36.0)
MCV: 71.5 fL — ABNORMAL LOW (ref 80.0–100.0)
Platelets: 449 10*3/uL — ABNORMAL HIGH (ref 150–400)
RBC: 3.4 MIL/uL — ABNORMAL LOW (ref 3.87–5.11)
RDW: 22.2 % — ABNORMAL HIGH (ref 11.5–15.5)
WBC: 19.1 10*3/uL — ABNORMAL HIGH (ref 4.0–10.5)
nRBC: 0 % (ref 0.0–0.2)

## 2020-04-03 LAB — COMPREHENSIVE METABOLIC PANEL
ALT: 11 U/L (ref 0–44)
AST: 30 U/L (ref 15–41)
Albumin: 2 g/dL — ABNORMAL LOW (ref 3.5–5.0)
Alkaline Phosphatase: 71 U/L (ref 38–126)
Anion gap: 9 (ref 5–15)
BUN: 24 mg/dL — ABNORMAL HIGH (ref 6–20)
CO2: 30 mmol/L (ref 22–32)
Calcium: 7.9 mg/dL — ABNORMAL LOW (ref 8.9–10.3)
Chloride: 104 mmol/L (ref 98–111)
Creatinine, Ser: 1.6 mg/dL — ABNORMAL HIGH (ref 0.44–1.00)
GFR calc Af Amer: 44 mL/min — ABNORMAL LOW (ref >60)
GFR calc non Af Amer: 38 mL/min — ABNORMAL LOW (ref >60)
Glucose, Bld: 111 mg/dL — ABNORMAL HIGH (ref 70–99)
Potassium: 3.8 mmol/L (ref 3.5–5.1)
Sodium: 143 mmol/L (ref 135–145)
Total Bilirubin: 0.6 mg/dL (ref 0.3–1.2)
Total Protein: 6.2 g/dL — ABNORMAL LOW (ref 6.5–8.1)

## 2020-04-03 LAB — MAGNESIUM: Magnesium: 2 mg/dL (ref 1.7–2.4)

## 2020-04-03 LAB — C-REACTIVE PROTEIN: CRP: 20.3 mg/dL — ABNORMAL HIGH (ref <1.0)

## 2020-04-03 LAB — FIBRIN DERIVATIVES D-DIMER (ARMC ONLY): Fibrin derivatives D-dimer (ARMC): 1405.15 ng/mL (FEU) — ABNORMAL HIGH (ref 0.00–499.00)

## 2020-04-03 LAB — URIC ACID: Uric Acid, Serum: 7.1 mg/dL (ref 2.5–7.1)

## 2020-04-03 LAB — PHOSPHORUS: Phosphorus: 3.3 mg/dL (ref 2.5–4.6)

## 2020-04-03 MED ORDER — THIAMINE HCL 100 MG/ML IJ SOLN
500.0000 mg | INTRAVENOUS | Status: AC
  Administered 2020-04-03 – 2020-04-07 (×5): 500 mg via INTRAVENOUS
  Filled 2020-04-03 (×5): qty 5

## 2020-04-03 MED ORDER — SILDENAFIL CITRATE 20 MG PO TABS
20.0000 mg | ORAL_TABLET | Freq: Three times a day (TID) | ORAL | Status: DC
  Administered 2020-04-03 – 2020-04-12 (×27): 20 mg via ORAL
  Filled 2020-04-03 (×32): qty 1

## 2020-04-03 MED ORDER — DEXTROSE IN LACTATED RINGERS 5 % IV SOLN
INTRAVENOUS | Status: DC
  Administered 2020-04-03 – 2020-04-04 (×2): 75 mL/h via INTRAVENOUS

## 2020-04-03 MED ORDER — SENNOSIDES-DOCUSATE SODIUM 8.6-50 MG PO TABS
2.0000 | ORAL_TABLET | Freq: Two times a day (BID) | ORAL | Status: DC
  Administered 2020-04-03 – 2020-04-15 (×15): 2 via ORAL
  Filled 2020-04-03 (×22): qty 2

## 2020-04-03 NOTE — Progress Notes (Signed)
[facility]  ROUNDING NOTE   Subjective:   Waxing and waning mental status.   UOP .   Creatinine 1.6 (1.64)  Na 143 (146)   Objective:  Vital signs in last 24 hours:  Temp:  [99.5 F (37.5 C)-102.8 F (39.3 C)] 100.6 F (38.1 C) (05/21 0300) Pulse Rate:  [97-105] 100 (05/21 0951) Resp:  [20-32] 25 (05/20 1800) BP: (118-178)/(74-113) 178/98 (05/21 0951) SpO2:  [92 %-97 %] 96 % (05/20 2053) FiO2 (%):  [50 %] 50 % (05/20 2100)  Weight change:  Filed Weights   03/30/20 0411 04/01/20 0500 04/02/20 0252  Weight: 119.7 kg 118.5 kg 117.4 kg    Intake/Output: I/O last 3 completed shifts: In: 3965.5 [P.O.:717; I.V.:2973.5; LOVFI:433; IV Piggyback:50] Out: 2250 [Urine:2250]   Intake/Output this shift:  Total I/O In: 23 [I.V.:23] Out: -   Physical Exam:  Not examined due to COVID-19 pandemic and preservation of PPE                                   Basic Metabolic Panel: Recent Labs  Lab 03/29/20 0514 03/29/20 0514 03/30/20 0423 03/30/20 0423 03/31/20 2951 03/31/20 8841 04/01/20 0457 04/02/20 0332 04/03/20 0422  NA 144   < > 148*  --  149*  --  149* 146* 143  K 4.4   < > 3.4*  --  3.3*  --  3.6 3.6 3.8  CL 104   < > 101  --  103  --  105 105 104  CO2 26   < > 32  --  35*  --  34* 33* 30  GLUCOSE 140*   < > 131*  --  117*  --  129* 100* 111*  BUN 83*   < > 76*  --  60*  --  46* 30* 24*  CREATININE 3.92*   < > 2.86*  --  2.17*  --  1.85* 1.64* 1.60*  CALCIUM 7.7*   < > 8.3*   < > 8.1*   < > 8.4* 8.0* 7.9*  MG 1.6*  --  2.2  --  2.1  --   --  1.7 2.0  PHOS 4.0  --   --   --   --   --   --  2.6 3.3   < > = values in this interval not displayed.    Liver Function Tests: Recent Labs  Lab 03/30/20 0423 03/31/20 0558 04/01/20 0457 04/02/20 0332 04/03/20 0422  AST 60* 52* 40 34 30  ALT 29 24 21 16 11   ALKPHOS 58 58 62 64 71  BILITOT 0.4 0.4 0.5 0.4 0.6  PROT 6.7 6.1* 6.6 6.2* 6.2*  ALBUMIN 2.6* 2.4* 2.2* 2.1* 2.0*   No  results for input(s): LIPASE, AMYLASE in the last 168 hours. Recent Labs  Lab 03/31/20 1200  AMMONIA 19    CBC: Recent Labs  Lab 03/30/20 0423 03/30/20 0423 03/31/20 0558 03/31/20 1817 04/01/20 0457 04/02/20 0332 04/02/20 1855 04/03/20 0422  WBC 10.2  --  12.3*  --  19.5* 15.6*  --  19.1*  HGB 8.3*   < > 7.1* 7.3* 7.2* 6.7* 7.2* 7.3*  HCT 26.5*   < > 24.0*  --  24.9* 22.9* 23.9* 24.3*  MCV 64.8*  --  69.2*  --  70.5* 70.5*  --  71.5*  PLT 732*  --  645*  --  572* 556*  --  449*   < > = values in this interval not displayed.    Cardiac Enzymes: No results for input(s): CKTOTAL, CKMB, CKMBINDEX, TROPONINI in the last 168 hours.  BNP: Invalid input(s): POCBNP  CBG: Recent Labs  Lab 03/28/20 0851 03/28/20 1620 03/29/20 1145 03/30/20 2050  GLUCAP 145* 170* 143* 114*    Microbiology: Results for orders placed or performed during the hospital encounter of 03/26/20  Culture, blood (Routine x 2)     Status: None   Collection Time: 03/26/20 12:47 PM   Specimen: BLOOD  Result Value Ref Range Status   Specimen Description BLOOD BLOOD LEFT HAND  Final   Special Requests   Final    BOTTLES DRAWN AEROBIC AND ANAEROBIC Blood Culture adequate volume   Culture   Final    NO GROWTH 5 DAYS Performed at [facility], [address] [place], [place] [address]    Report Status 03/31/2020 FINAL  Final  Culture, blood (Routine x 2)     Status: None   Collection Time: 03/26/20 12:47 PM   Specimen: BLOOD  Result Value Ref Range Status   Specimen Description BLOOD LEFT ANTECUBITAL  Final   Special Requests   Final    BOTTLES DRAWN AEROBIC AND ANAEROBIC Blood Culture adequate volume   Culture   Final    NO GROWTH 5 DAYS Performed at [facility], [address] [place], [place] [address]    Report Status 03/31/2020 FINAL  Final  Urine Culture     Status: Abnormal   Collection Time: 03/26/20  1:55 PM   Specimen: Urine, Random  Result Value Ref Range  Status   Specimen Description   Final    URINE, RANDOM Performed at [facility], [address] [place], [place] [address]    Special Requests   Final    NONE Performed at [facility], [address] [place], [place] [address]    Culture >=100,000 COLONIES/mL ESCHERICHIA COLI (A)  Final   Report Status 03/29/2020 FINAL  Final   Organism ID, Bacteria ESCHERICHIA COLI (A)  Final      Susceptibility   Escherichia coli - MIC*    AMPICILLIN <=2 SENSITIVE Sensitive     CEFAZOLIN <=4 SENSITIVE Sensitive     CEFTRIAXONE <=1 SENSITIVE Sensitive     CIPROFLOXACIN <=0.25 SENSITIVE Sensitive     GENTAMICIN <=1 SENSITIVE Sensitive     IMIPENEM <=0.25 SENSITIVE Sensitive     NITROFURANTOIN <=16 SENSITIVE Sensitive     TRIMETH/SULFA <=20 SENSITIVE Sensitive     AMPICILLIN/SULBACTAM <=2 SENSITIVE Sensitive     PIP/TAZO <=4 SENSITIVE Sensitive     * >=100,000 COLONIES/mL ESCHERICHIA COLI  SARS Coronavirus 2 by RT PCR (hospital order, performed in [facility] hospital lab) Nasopharyngeal Nasopharyngeal Swab     Status: Abnormal   Collection Time: 03/26/20  2:47 PM   Specimen: Nasopharyngeal Swab  Result Value Ref Range Status   SARS Coronavirus 2 POSITIVE (A) NEGATIVE Final    Comment: RESULT CALLED TO, READ BACK BY AND VERIFIED WITH: CADY,R AT 1608 ON 03/26/2020 BY MOSLEY,J (NOTE) SARS-CoV-2 target nucleic acids are DETECTED SARS-CoV-2 RNA is generally detectable in upper respiratory specimens  during the acute phase of infection.  Positive results are indicative  of the presence of the identified virus, but do not rule out bacterial infection or co-infection with other pathogens not detected by the test.  Clinical correlation with patient history and  other diagnostic information is necessary to determine  patient infection status.  The expected result is negative. Fact Sheet for Patients:   BoilerBrush.com.cy  Fact Sheet for Healthcare  Providers:   https://pope.com/   This test is not yet approved or cleared by the [place] FDA and  has been authorized for detection and/or diagnosis of SARS-CoV-2 by FDA under an Emergency Use Authorization (EUA).  This EUA will remain in effect (meaning this test  can be used) for the duration of  the COVID-19 declaration under Section 564(b)(1) of the Act, 21 U.S.C. section 360-bbb-3(b)(1), unless the authorization is terminated or revoked sooner. Performed at [facility], [address] [place], [place] [address]   MRSA PCR Screening     Status: None   Collection Time: 03/26/20  9:56 PM   Specimen: Nasopharyngeal  Result Value Ref Range Status   MRSA by PCR NEGATIVE NEGATIVE Final    Comment:        The GeneXpert MRSA Assay (FDA approved for NASAL specimens only), is one component of a comprehensive MRSA colonization surveillance program. It is not intended to diagnose MRSA infection nor to guide or monitor treatment for MRSA infections. Performed at [facility], [address] [place], [place] [address]     Coagulation Studies: No results for input(s): LABPROT, INR in the last 72 hours.  Urinalysis: No results for input(s): COLORURINE, LABSPEC, PHURINE, GLUCOSEU, HGBUR, BILIRUBINUR, KETONESUR, PROTEINUR, UROBILINOGEN, NITRITE, LEUKOCYTESUR in the last 72 hours.  Invalid input(s): APPERANCEUR    Imaging: CT HEAD WO CONTRAST  Result Date: 04/02/2020 CLINICAL DATA:  44 year old female with COVID-19. Pneumonia. Delirium, confusion, hypoxia. EXAM: CT HEAD WITHOUT CONTRAST TECHNIQUE: Contiguous axial images were obtained from the base of the skull through the vertex without intravenous contrast. COMPARISON:  Brain MRI and intracranial MRA 05/11/2017. Head CT 05/10/2017. FINDINGS: Brain: Bulky chronic dural calcifications again noted. Resolved right basal ganglia hemorrhage since 2018 with a small area of encephalomalacia  (series 2 image 14. Underlying cerebral volume remains normal. No midline shift, ventriculomegaly, mass effect, evidence of mass lesion, intracranial hemorrhage or evidence of cortically based acute infarction. No other encephalomalacia identified. Vascular: No suspicious intracranial vascular hyperdensity. Skull: No acute osseous abnormality identified. Sinuses/Orbits: Widespread bilateral paranasal sinus fluid and bubbly opacity although improved sinus aeration compared to the 2018 exams. Hyperplastic sphenoid wing, petrous apex and mastoid air cells remain well pneumatized. There is new minimal to mild left side mastoid effusion. Other: Negative orbit and scalp soft tissues. IMPRESSION: 1. 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 2. No new intracranial abnormality. 3. Widespread paranasal sinus inflammation, although improved compared to 2018. Electronically Signed   By: Odessa Fleming M.D.   On: 04/02/2020 18:22     Medications:   . sodium chloride    . dextrose 75 mL/hr at 04/03/20 0357   . sodium chloride   Intravenous Once  . aerochamber plus with mask  1 each Other Once  . albuterol  1 puff Inhalation Q4H  . B-complex with vitamin C  1 tablet Oral Daily  . buprenorphine-naloxone  1 tablet Sublingual Daily  . Chlorhexidine Gluconate Cloth  6 each Topical Daily  . ciclesonide  4 puff Inhalation BID  . feeding supplement (ENSURE ENLIVE)  237 mL Oral TID BM  . ipratropium  2 puff Inhalation Q4H  . labetalol  300 mg Oral BID  . polyethylene glycol  17 g Oral Daily  . sodium chloride flush  10-40 mL Intracatheter Q12H  .  sodium chloride flush  3 mL Intravenous Q12H   sodium chloride, acetaminophen, docusate sodium, labetalol, lip balm, ondansetron (ZOFRAN) IV, sodium chloride flush, sodium chloride flush  Assessment/ Plan:  Ms. Diane Keller is a 44 y.o. black female with history of anxiety, GERD, prior history of CVA who presents with  COVID-19 infection and acute kidney injury.  1.  Acute kidney injury secondary to acute tubular necrosis.Baseline creatinine of 0.87 in 2018.  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   2. Hypertension: elevated this morning 178/98 - PO labetalol (outpatient regimen) - IV labetalol PRN  3. Hypernatremia: free water deficit has resolved with dextrose infusion.  - Continue D5W infusion   4. Anemia with renal failure: hemoglobin 7.3. Status post PRBC transfusion on 5/15.   5. Thrombocytosis: with elevated D-dimer. Lower extremity dopplers are negative.  Platelet counts are trending down.   6. COVID-19 pneumonia on remdesivir, azithro, ceftriaxone, and dexamethazone. Requiring HFNC Appreciate pulmonary input.   7. Urinary Tract Infection: E. Coli - treated with ceftriaxone.    LOS: 8 Diane Keller 5/21/202110:06 AM

## 2020-04-03 NOTE — Progress Notes (Addendum)
CRITICAL CARE NOTE  5/14 admitted for COVID pneumonia, anemia and Renal failure 5/14 8L [place], worsening renal function 5/15 very good UOP, 1500 cc's 5/16 good UOP 1200CC's 5/17- patient had episode of respiratory distress with hypoxemia, we discussed this with daughter, high risk for intubation 5/18-  Patient is hemodynamically improved.  She is with confusion and intermittent lethargy.   04/01/20- patient still with confusion, FIO2 requiremnt has improved on 60% 35L/min 04/02/20 - patient remains with intermittent confusion, she seems to have component of icu delerium and possible metabolic encephalopathy 8/65/78 -mental status clearing.  Started on sildenafil for persistent hypoxemia in the setting of COVID-19 and evidence of pulmonary hypertension on 2D echo   CC  follow up respiratory failure Renal failure  SUBJECTIVE Mental status clearing somewhat Can tell me where she is but has delayed responses On high flow nasal cannula No SOB, resting comfortable Feels better BUN and creatinine improving H/H stable   BP (!) 155/92   Pulse 86   Temp 100.1 F (37.8 C) (Oral)   Resp (!) 21   Ht 5\' 6"  (1.676 m)   Wt 117.4 kg   SpO2 98%   BMI 41.77 kg/m    I/O last 3 completed shifts: In: 3965.5 [P.O.:717; I.V.:2973.5; Other:225; IV Piggyback:50] Out: 2250 [Urine:2250] Total I/O In: 1948.2 [P.O.:720; I.V.:1178.2; IV Piggyback:50] Out: 1400 [Urine:1400]  SpO2: 98 % O2 Flow Rate (L/min): 60 L/min FiO2 (%): 97 %  Estimated body mass index is 41.77 kg/m as calculated from the following:   Height as of this encounter: 5\' 6"  (1.676 m).   Weight as of this encounter: 117.4 kg.   Review of Systems: Review of systems limited due to her mental status.  Does not express any complaints today.   Physical Examination:   General Appearance: Sleepy but arousable.  Follows commands. Neuro:without focal findings,  speech normal, oriented to person and place HEENT: PERRLA, EOM intact.   Neck: Supple, no JVD, trachea midline. Pulmonary: Difficult assessment with PPE/CAPR.  No tachypnea noted. Cardiovascular: Sinus rhythm on monitor Abdomen: Obese, benign, soft, non-tender. Skin:   warm, no rashes, no ecchymosis  Extremities: normal, no cyanosis, clubbing. PSYCHIATRIC: Mild psychomotor retardation, flat affect, mildly befuddled.   MEDICATIONS: I have reviewed all medications and confirmed regimen as documented   CULTURE RESULTS   Recent Results (from the past 240 hour(s))  Culture, blood (Routine x 2)     Status: None   Collection Time: 03/26/20 12:47 PM   Specimen: BLOOD  Result Value Ref Range Status   Specimen Description BLOOD BLOOD LEFT HAND  Final   Special Requests   Final    BOTTLES DRAWN AEROBIC AND ANAEROBIC Blood Culture adequate volume   Culture   Final    NO GROWTH 5 DAYS Performed at [facility], [address] [place], [place] [address]    Report Status 03/31/2020 FINAL  Final  Culture, blood (Routine x 2)     Status: None   Collection Time: 03/26/20 12:47 PM   Specimen: BLOOD  Result Value Ref Range Status   Specimen Description BLOOD LEFT ANTECUBITAL  Final   Special Requests   Final    BOTTLES DRAWN AEROBIC AND ANAEROBIC Blood Culture adequate volume   Culture   Final    NO GROWTH 5 DAYS Performed at [facility], [address], [place], [place] [address]    Report Status 03/31/2020 FINAL  Final  Urine Culture     Status: Abnormal   Collection  Time: 03/26/20  1:55 PM   Specimen: Urine, Random  Result Value Ref Range Status   Specimen Description   Final    URINE, RANDOM Performed at [facility], [address] [place], [place] [address]    Special Requests   Final    NONE Performed at [facility], [address] [place], [place] [address]    Culture >=100,000 COLONIES/mL ESCHERICHIA COLI (A)  Final   Report Status 03/29/2020 FINAL  Final   Organism ID, Bacteria ESCHERICHIA COLI (A)   Final      Susceptibility   Escherichia coli - MIC*    AMPICILLIN <=2 SENSITIVE Sensitive     CEFAZOLIN <=4 SENSITIVE Sensitive     CEFTRIAXONE <=1 SENSITIVE Sensitive     CIPROFLOXACIN <=0.25 SENSITIVE Sensitive     GENTAMICIN <=1 SENSITIVE Sensitive     IMIPENEM <=0.25 SENSITIVE Sensitive     NITROFURANTOIN <=16 SENSITIVE Sensitive     TRIMETH/SULFA <=20 SENSITIVE Sensitive     AMPICILLIN/SULBACTAM <=2 SENSITIVE Sensitive     PIP/TAZO <=4 SENSITIVE Sensitive     * >=100,000 COLONIES/mL ESCHERICHIA COLI  SARS Coronavirus 2 by RT PCR (hospital order, performed in [facility] hospital lab) Nasopharyngeal Nasopharyngeal Swab     Status: Abnormal   Collection Time: 03/26/20  2:47 PM   Specimen: Nasopharyngeal Swab  Result Value Ref Range Status   SARS Coronavirus 2 POSITIVE (A) NEGATIVE Final    Comment: RESULT CALLED TO, READ BACK BY AND VERIFIED WITH: CADY,R AT 1608 ON 03/26/2020 BY MOSLEY,J (NOTE) SARS-CoV-2 target nucleic acids are DETECTED SARS-CoV-2 RNA is generally detectable in upper respiratory specimens  during the acute phase of infection.  Positive results are indicative  of the presence of the identified virus, but do not rule out bacterial infection or co-infection with other pathogens not detected by the test.  Clinical correlation with patient history and  other diagnostic information is necessary to determine patient infection status.  The expected result is negative. Fact Sheet for Patients:   BoilerBrush.com.cy  Fact Sheet for Healthcare Providers:   https://pope.com/   This test is not yet approved or cleared by the [place] FDA and  has been authorized for detection and/or diagnosis of SARS-CoV-2 by FDA under an Emergency Use Authorization (EUA).  This EUA will remain in effect (meaning this test  can be used) for the duration of  the COVID-19 declaration under Section 564(b)(1) of the Act, 21 U.S.C. section  360-bbb-3(b)(1), unless the authorization is terminated or revoked sooner. Performed at [facility], [address] [place], [place] [address]   MRSA PCR Screening     Status: None   Collection Time: 03/26/20  9:56 PM   Specimen: Nasopharyngeal  Result Value Ref Range Status   MRSA by PCR NEGATIVE NEGATIVE Final    Comment:        The GeneXpert MRSA Assay (FDA approved for NASAL specimens only), is one component of a comprehensive MRSA colonization surveillance program. It is not intended to diagnose MRSA infection nor to guide or monitor treatment for MRSA infections. Performed at [facility], [address], [place], [place] [address]           IMAGING    No results found.   ASSESSMENT AND PLAN  Acute hypoxic respiratory failure due to COVID-19 COVID-19 pneumonia Holding IV steroids due to confusion  Completed IV remdesivir Completed ivermectin Aggressive pulm toilet  OOB to chair as tolerated  Pulmonary hygiene Continue  proning as tolerated due to severe hypoxia Add sildenafil due to persistent hypoxemia Maintain airborne and contact precautions  [place] via MDI Vitamin C and zinc Antitussives   Encephalopathy due to COVID-19 Supportive measures IV steroids discontinued due to increasing confusion  Anemia Iron deficiency anemia No overt signs of bleeding H&H stable May need transfusion for increased oxygen carrying capacity Received 1 unit of PRBCs earlier in course  Morbid obesity, possible OSA Pulmonary hypertension on 2D echo  Will certainly impact respiratory mechanics This issue adds complexity to her management  ACUTE KIDNEY INJURY/Renal Failure -continue Foley Catheter-assess need -Avoid nephrotoxic agents -Follow urine output, BMP -Ensure adequate renal perfusion, optimize oxygenation -Renal dose medications -Nephrology following -Overall improved  Pulmonary hypertension Diastolic dysfunction These issues  add complexity to her management Supportive care  ID E. coli UTI Treated with Rocephin  GI GI PROPHYLAXIS as indicated  NUTRITIONAL STATUS Nutrition Status: Nutrition Problem: Increased nutrient needs Etiology: catabolic illness(COVID-19) Signs/Symptoms: estimated needs Interventions: Ensure Enlive (each supplement provides 350kcal and 20 grams of protein), Magic cup   DIET-->as tolerated Constipation protocol as indicated  ENDO - will use ICU hypoglycemic\Hyperglycemia protocol if indicated     ELECTROLYTES -follow labs as needed -replace as needed -pharmacy consultation and following   Multidisciplinary rounds were performed with the ICU team.     Gailen Shelter, MD [facility]  *This note was dictated using voice recognition software/Dragon.  Despite best efforts to proofread, errors can occur which can change the meaning.  Any change was purely unintentional.

## 2020-04-03 NOTE — Progress Notes (Addendum)
Pharmacy Electrolyte Monitoring Consult:  Pharmacy consulted to assist in monitoring and replacing electrolytes in this 44 y.o. female admitted on 03/26/2020 with COVID-19 infection and AKI. Patient is at risk for refeeding syndrome.   Labs:  Sodium (mmol/L)  Date Value  04/03/2020 143  05/11/2012 140   Potassium (mmol/L)  Date Value  04/03/2020 3.8  05/11/2012 3.2 (L)   Magnesium (mg/dL)  Date Value  83/38/2505 2.0   Phosphorus (mg/dL)  Date Value  39/76/7341 3.3   Calcium (mg/dL)  Date Value  93/79/0240 7.9 (L)   Calcium, Total (mg/dL)  Date Value  97/35/3299 9.2   Albumin (g/dL)  Date Value  24/26/8341 2.0 (L)  05/11/2012 3.7   Assessment/Plan:  Electrolytes:  Goal for electrolytes WNL.   Currently on D5LR @75  mL/h for hypernatremia, which is improving.  No replacement indicated at this time. Will continue to monitor with AM labs and replace as indicated.   Glucose: SBG 111 with AM labs. Patient is not eating well at meals and refusing NG. Patient remains off of IV steroids. Will continue to monitor glucose trend. (Per discussion in AM ICU rounds, patient may start TPN over the weekend, but not confirmed.)  Constipation: LBM 5/17. Patient is not eating well which might be a contributing factor. Currently on miralax.  Will add senokot-S. Will continue to monitor.   Pharmacy will continue to monitor and adjust per consult.    6/17, PharmD  04/03/2020 12:46 PM

## 2020-04-03 NOTE — Progress Notes (Signed)
Assisted tele visit to patient with family member.  Aman Bonet M, RN   

## 2020-04-03 NOTE — Progress Notes (Signed)
Physical Therapy Treatment Patient Details Name: Diane Keller MRN: [id number] DOB: September 29, 1976 Today's Date: 04/03/2020    History of Present Illness Per MD notes: Pt is a 44 y.o. female with a history of GERD and prior stroke with residual left-sided motor deficit who is brought to the ED due to fever and generalized weakness.  MD assessment includes: COVID-19 pneumonia, AMS/confusion, morbid obesity, and AKI/renal failure.    PT Comments    Pt pleasant but remained confused during the session requiring extensive multi-modal cues and extra time to process and follow commands.  Pt required min A with sup to/from sit for both BLE and trunk control and mod A to stand after several failed attempts.  Pt unable to advance either LE in standing and returned to sitting after max standing tolerance of grossly 30 sec.  Pt will benefit from PT services in a SNF setting upon discharge to safely address deficits listed in patient problem list for decreased caregiver assistance and eventual return to PLOF.    Follow Up Recommendations  SNF     Equipment Recommendations  Rolling walker with 5" wheels    Recommendations for Other Services       Precautions / Restrictions Precautions Precautions: Fall Restrictions Weight Bearing Restrictions: No    Mobility  Bed Mobility Overal bed mobility: Needs Assistance Bed Mobility: Supine to Sit;Sit to Supine     Supine to sit: Min assist Sit to supine: Min assist   General bed mobility comments: Min A for BLE control and for trunk to full upright sitting  Transfers Overall transfer level: Needs assistance Equipment used: Rolling walker (2 wheeled) Transfers: Sit to/from Stand Sit to Stand: Mod assist         General transfer comment: Mod verbal cues for sequencing; multiple sit to/from stand attempts made before pt able to come to complete standing   Ambulation/Gait             General Gait Details: Pt unable to advance either LE  this session   Stairs             Wheelchair Mobility    Modified Rankin (Stroke Patients Only)       Balance                                            Cognition Arousal/Alertness: Awake/alert Behavior During Therapy: Flat affect Overall Cognitive Status: No family/caregiver present to determine baseline cognitive functioning                                        Exercises Total Joint Exercises Ankle Circles/Pumps: Strengthening;Both;10 reps;15 reps Quad Sets: Strengthening;Both;10 reps Short Arc Quad: Strengthening;Both;10 reps Heel Slides: AAROM;Both;10 reps;15 reps Hip ABduction/ADduction: AAROM;Both;10 reps;15 reps Straight Leg Raises: AAROM;Both;10 reps;15 reps Other Exercises: rolling left/right with min-mod A Other Exercises: Static sitting at the EOB for improved core therex and activity tolerance Lateral scooting training at the EOB     General Comments        Pertinent Vitals/Pain Pain Assessment: No/denies pain    Home Living                      Prior Function            PT  Goals (current goals can now be found in the care plan section) Progress towards PT goals: Not progressing toward goals - comment(Pt remains limited by weakness and confusion)    Frequency    Min 2X/week      PT Plan Current plan remains appropriate    Co-evaluation              AM-PAC PT "6 Clicks" Mobility   Outcome Measure  Help needed turning from your back to your side while in a flat bed without using bedrails?: A Lot Help needed moving from lying on your back to sitting on the side of a flat bed without using bedrails?: A Lot Help needed moving to and from a bed to a chair (including a wheelchair)?: A Lot Help needed standing up from a chair using your arms (e.g., wheelchair or bedside chair)?: A Lot Help needed to walk in hospital room?: Total Help needed climbing 3-5 steps with a railing? : Total 6  Click Score: 10    End of Session Equipment Utilized During Treatment: Gait belt;Oxygen Activity Tolerance: Patient tolerated treatment well Patient left: in bed;with call bell/phone within reach;with nursing/sitter in room;Other (comment)(bil rails up in ICU as pt was found with nrsg present at end of session) Nurse Communication: Mobility status PT Visit Diagnosis: Muscle weakness (generalized) (M62.81);Difficulty in walking, not elsewhere classified (R26.2)     Time: 4388-8757 PT Time Calculation (min) (ACUTE ONLY): 39 min  Charges:  $Therapeutic Exercise: 23-37 mins $Therapeutic Activity: 8-22 mins                     D. Scott Gabrelle Roca PT, DPT 04/03/20, 3:01 PM

## 2020-04-03 NOTE — Progress Notes (Signed)
Nutrition Follow-up  DOCUMENTATION CODES:   Morbid obesity  INTERVENTION:   Continue Ensure Enlive po TID, each supplement provides 350 kcal and 20 grams of protein.  Continue Magic cup TID with meals, each supplement provides 290 kcal and 9 grams of protein.  Monitor magnesium, potassium, and phosphorus daily for at least 3 days, MD to replete as needed, as pt is at risk for refeeding syndrome given prolonged poor PO intake.  If TPN initiated, recommend: Clinimix E 5/20 _0 /hr x 24 hrs (goal rate 7m/hr) + 20% ILE @ 2569mdaily x 12 hrs per day, MVI and trace elements daily in TPN. Adjust IVF per MD. Regimen provides 2252kcal/day, 100g/day protein and 22421may volume.   NUTRITION DIAGNOSIS:   Increased nutrient needs related to catabolic illMCEYEMV(VKPQA-44s evidenced by estimated needs. Ongoing.  GOAL:   Patient will meet greater than or equal to 90% of their needs Not met.  MONITOR:   PO intake, Supplement acceptance, Labs, Weight trends, I & O's  ASSESSMENT:   [age]ar old female with PMHx of GERD, anxiety, hx CVA 2018 admitted with COVID PNA and renal failure.  Unable to place NGT as pt with agitation. Pt continues to have poor appetite and oral intake. Nutrition was discussed with MD; plan is to initiate high dose IV thiamine today. If pt does not have any improvement in mental status and remains unable to have nasogastric tube placed or take in adequate oral intake, TPN will need to be considered. Pt remains at high refeed risk.   Per chart, pt has remained fairly weight stable since admit.   Medications reviewed and include: B-complex with C 1 tablet daily, miralax, senokot, D5W at 75 mL/hr, thiamine 500m98m daily   Labs reviewed: Sodium 143, K 3.8 wnl, BUN 24(H), creat 1.60(H), P 3.3 wnl, Mg 2.0 wnl, uric acid 7.1 wnl Wbc- 19.1(H), Hgb 7.3(L), Hct 24.3(L), MCV 71.5(L), MCH 21.5(L)  Diet Order:   Diet Order            Diet regular Room service appropriate?  Yes; Fluid consistency: Thin  Diet effective now             EDUCATION NEEDS:   No education needs have been identified at this time  Skin:  Skin Assessment: Reviewed RN Assessment  Last BM:  5/17  Height:   Ht Readings from Last 1 Encounters:  03/26/20 _1  (1.676 m)   Weight:   Wt Readings from Last 1 Encounters:  04/02/20 117.4 kg   BMI:  Body mass index is 41.77 kg/m.  Estimated Nutritional Needs:   Kcal:  [id number]:  115-125 grams  Fluid:  >/= 2 L/day  CaseKoleen Distance RD, LDN Please refer to [facility] and/or RD on-call/weekend/after hours pager

## 2020-04-04 LAB — BASIC METABOLIC PANEL
Anion gap: 6 (ref 5–15)
BUN: 20 mg/dL (ref 6–20)
CO2: 30 mmol/L (ref 22–32)
Calcium: 8.1 mg/dL — ABNORMAL LOW (ref 8.9–10.3)
Chloride: 105 mmol/L (ref 98–111)
Creatinine, Ser: 1.22 mg/dL — ABNORMAL HIGH (ref 0.44–1.00)
GFR calc Af Amer: >60 mL/min (ref >60)
GFR calc non Af Amer: 53 mL/min — ABNORMAL LOW (ref >60)
Glucose, Bld: 109 mg/dL — ABNORMAL HIGH (ref 70–99)
Potassium: 3.6 mmol/L (ref 3.5–5.1)
Sodium: 141 mmol/L (ref 135–145)

## 2020-04-04 LAB — CBC WITH DIFFERENTIAL/PLATELET
Abs Immature Granulocytes: 0.36 10*3/uL — ABNORMAL HIGH (ref 0.00–0.07)
Basophils Absolute: 0 10*3/uL (ref 0.0–0.1)
Basophils Relative: 0 %
Eosinophils Absolute: 0.1 10*3/uL (ref 0.0–0.5)
Eosinophils Relative: 1 %
HCT: 24.4 % — ABNORMAL LOW (ref 36.0–46.0)
Hemoglobin: 7.3 g/dL — ABNORMAL LOW (ref 12.0–15.0)
Immature Granulocytes: 2 %
Lymphocytes Relative: 6 %
Lymphs Abs: 1.1 10*3/uL (ref 0.7–4.0)
MCH: 21.2 pg — ABNORMAL LOW (ref 26.0–34.0)
MCHC: 29.9 g/dL — ABNORMAL LOW (ref 30.0–36.0)
MCV: 70.9 fL — ABNORMAL LOW (ref 80.0–100.0)
Monocytes Absolute: 1.4 10*3/uL — ABNORMAL HIGH (ref 0.1–1.0)
Monocytes Relative: 8 %
Neutro Abs: 14.6 10*3/uL — ABNORMAL HIGH (ref 1.7–7.7)
Neutrophils Relative %: 83 %
Platelets: 394 10*3/uL (ref 150–400)
RBC: 3.44 MIL/uL — ABNORMAL LOW (ref 3.87–5.11)
RDW: 22.7 % — ABNORMAL HIGH (ref 11.5–15.5)
WBC: 17.6 10*3/uL — ABNORMAL HIGH (ref 4.0–10.5)
nRBC: 0 % (ref 0.0–0.2)

## 2020-04-04 LAB — PHOSPHORUS: Phosphorus: 3.5 mg/dL (ref 2.5–4.6)

## 2020-04-04 LAB — MAGNESIUM: Magnesium: 1.7 mg/dL (ref 1.7–2.4)

## 2020-04-04 MED ORDER — DEXAMETHASONE SODIUM PHOSPHATE 4 MG/ML IJ SOLN
4.0000 mg | INTRAMUSCULAR | Status: DC
  Administered 2020-04-04 – 2020-04-15 (×12): 4 mg via INTRAVENOUS
  Filled 2020-04-04 (×12): qty 1

## 2020-04-04 MED ORDER — MAGNESIUM SULFATE 2 GM/50ML IV SOLN
2.0000 g | Freq: Once | INTRAVENOUS | Status: AC
  Administered 2020-04-04: 2 g via INTRAVENOUS
  Filled 2020-04-04: qty 50

## 2020-04-04 NOTE — Progress Notes (Signed)
Assisted tele visit to patient with family member.  Doneisha Ivey M, RN  

## 2020-04-04 NOTE — Progress Notes (Signed)
Assisted tele visit to patient with family member.  Salome Hautala M, RN  

## 2020-04-04 NOTE — Progress Notes (Signed)
Diane Keller  MRN: [id number]  DOB/AGE: September 21, 1976 44 y.o.  Primary Care Physician:Fisher, Demetrios Isaacs, MD  Admit date: 03/26/2020  Chief Complaint:  Chief Complaint  Patient presents with  . Weakness  . Fever    S-Pt presented on  03/26/2020 with  Chief Complaint  Patient presents with  . Weakness  . Fever  . Patient seen in ICU.  Patient resting comfortably in the bed. Patient main concern was if he could drink some water Patient was no other specific concerns  Medications   . sodium chloride   Intravenous Once  . aerochamber plus with mask  1 each Other Once  . albuterol  1 puff Inhalation Q4H  . B-complex with vitamin C  1 tablet Oral Daily  . buprenorphine-naloxone  1 tablet Sublingual Daily  . Chlorhexidine Gluconate Cloth  6 each Topical Daily  . ciclesonide  4 puff Inhalation BID  . feeding supplement (ENSURE ENLIVE)  237 mL Oral TID BM  . ipratropium  2 puff Inhalation Q4H  . labetalol  300 mg Oral BID  . polyethylene glycol  17 g Oral Daily  . senna-docusate  2 tablet Oral BID  . sildenafil  20 mg Oral TID  . sodium chloride flush  10-40 mL Intracatheter Q12H  . sodium chloride flush  3 mL Intravenous Q12H         VQQ:VZDGL from the symptoms mentioned above,there are no other symptoms referable to all systems reviewed.  Physical Exam: Vital signs in last 24 hours: Temp:  [98.9 F (37.2 C)-100.1 F (37.8 C)] 98.9 F (37.2 C) (05/22 0800) Pulse Rate:  [84-92] 88 (05/22 0900) Resp:  [18-24] 24 (05/22 0900) BP: (125-163)/(75-120) 130/81 (05/22 0900) SpO2:  [96 %-100 %] 96 % (05/22 0900) FiO2 (%):  [70 %-97 %] 70 % (05/22 0455) Weight:  [116.2 kg] 116.2 kg (05/22 0500) Weight change:  Last BM Date: 03/30/20  Intake/Output from previous day: 05/21 0701 - 05/22 0700 In: 3184.9 [P.O.:720; I.V.:2064.9; IV Piggyback:50] Out: 1400 [Urine:1400] Total I/O In: 243 [P.O.:240; I.V.:3] Out: -    Physical Exam: General- pt is awake,alert, follows  commands  resp- No acute REsp distress Rhonchi present CVS- S1S2 regular in rate and rhythm GIT- BS+, soft, NT, ND EXT- NO LE Edema, no cyanosis   Lab Results: CBC Recent Labs    04/03/20 0422 04/04/20 0510  WBC 19.1* 17.6*  HGB 7.3* 7.3*  HCT 24.3* 24.4*  PLT 449* 394    BMET Recent Labs    04/03/20 0422 04/04/20 0510  NA 143 141  K 3.8 3.6  CL 104 105  CO2 30 30  GLUCOSE 111* 109*  BUN 24* 20  CREATININE 1.60* 1.22*  CALCIUM 7.9* 8.1*    Creatinine trend May 13 and 21 7.3 Mar 27, 2020 6.5 Mar 28, 2020 5.3 Mar 29 1020 3.9 May 17 to May 20 21.8 has slowly decreased from 3.9-1.6 Apr 04, 2020 1.2  MICRO Recent Results (from the past 240 hour(s))  Culture, blood (Routine x 2)     Status: None   Collection Time: 03/26/20 12:47 PM   Specimen: BLOOD  Result Value Ref Range Status   Specimen Description BLOOD BLOOD LEFT HAND  Final   Special Requests   Final    BOTTLES DRAWN AEROBIC AND ANAEROBIC Blood Culture adequate volume   Culture   Final    NO GROWTH 5 DAYS Performed at [facility], [address], [place], [place] [address]  Report Status 03/31/2020 FINAL  Final  Culture, blood (Routine x 2)     Status: None   Collection Time: 03/26/20 12:47 PM   Specimen: BLOOD  Result Value Ref Range Status   Specimen Description BLOOD LEFT ANTECUBITAL  Final   Special Requests   Final    BOTTLES DRAWN AEROBIC AND ANAEROBIC Blood Culture adequate volume   Culture   Final    NO GROWTH 5 DAYS Performed at [facility], [address] [place], [place] [address]    Report Status 03/31/2020 FINAL  Final  Urine Culture     Status: Abnormal   Collection Time: 03/26/20  1:55 PM   Specimen: Urine, Random  Result Value Ref Range Status   Specimen Description   Final    URINE, RANDOM Performed at [facility], [address], [place], [place] [address]    Special Requests   Final    NONE Performed at [facility], [address] [place], [place] [address]    Culture >=100,000 COLONIES/mL ESCHERICHIA COLI (A)  Final   Report Status 03/29/2020 FINAL  Final   Organism ID, Bacteria ESCHERICHIA COLI (A)  Final      Susceptibility   Escherichia coli - MIC*    AMPICILLIN <=2 SENSITIVE Sensitive     CEFAZOLIN <=4 SENSITIVE Sensitive     CEFTRIAXONE <=1 SENSITIVE Sensitive     CIPROFLOXACIN <=0.25 SENSITIVE Sensitive     GENTAMICIN <=1 SENSITIVE Sensitive     IMIPENEM <=0.25 SENSITIVE Sensitive     NITROFURANTOIN <=16 SENSITIVE Sensitive     TRIMETH/SULFA <=20 SENSITIVE Sensitive     AMPICILLIN/SULBACTAM <=2 SENSITIVE Sensitive     PIP/TAZO <=4 SENSITIVE Sensitive     * >=100,000 COLONIES/mL ESCHERICHIA COLI  SARS Coronavirus 2 by RT PCR (hospital order, performed in [facility] hospital lab) Nasopharyngeal Nasopharyngeal Swab     Status: Abnormal   Collection Time: 03/26/20  2:47 PM   Specimen: Nasopharyngeal Swab  Result Value Ref Range Status   SARS Coronavirus 2 POSITIVE (A) NEGATIVE Final    Comment: RESULT CALLED TO, READ BACK BY AND VERIFIED WITH: CADY,R AT 1608 ON 03/26/2020 BY MOSLEY,J (NOTE) SARS-CoV-2 target nucleic acids are DETECTED SARS-CoV-2 RNA is generally detectable in upper respiratory specimens  during the acute phase of infection.  Positive results are indicative  of the presence of the identified virus, but do not rule out bacterial infection or co-infection with other pathogens not detected by the test.  Clinical correlation with patient history and  other diagnostic information is necessary to determine patient infection status.  The expected result is negative. Fact Sheet for Patients:   BoilerBrush.com.cy  Fact Sheet for Healthcare Providers:   https://pope.com/   This test is not yet approved or cleared by the [place] FDA and  has been authorized for detection and/or diagnosis of SARS-CoV-2 by FDA under an Emergency Use  Authorization (EUA).  This EUA will remain in effect (meaning this test  can be used) for the duration of  the COVID-19 declaration under Section 564(b)(1) of the Act, 21 U.S.C. section 360-bbb-3(b)(1), unless the authorization is terminated or revoked sooner. Performed at [facility], [address] [place], [place] [address]   MRSA PCR Screening     Status: None   Collection Time: 03/26/20  9:56 PM   Specimen: Nasopharyngeal  Result Value Ref Range Status   MRSA by PCR NEGATIVE NEGATIVE Final    Comment:  The GeneXpert MRSA Assay (FDA approved for NASAL specimens only), is one component of a comprehensive MRSA colonization surveillance program. It is not intended to diagnose MRSA infection nor to guide or monitor treatment for MRSA infections. Performed at [facility], [address] [place], [place] [address]       Lab Results  Component Value Date   CALCIUM 8.1 (L) 04/04/2020   PHOS 3.5 04/04/2020               Impression:   Patient is a 44 year old African-American female with a past medical history of anxiety, GERD, history of CVA who was admitted to the hospital with chief complaint of Covid 19 infection and AKI.   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 Patient is being closely followed by the hematology/oncology and the primary team   4) Covid positive Patient is being followed closely by the critical care team  5) electrolytes Normokalemic NOrmonatremic   6)Acid base Patient had a known nongap acidosis and nongap acidosis earlier Patient is now much better  Plan:  AKI is now much better     Diane Keller s [facility] 04/04/2020, 10:02 AM

## 2020-04-04 NOTE — Progress Notes (Signed)
Pharmacy Electrolyte Monitoring Consult:  Pharmacy consulted to assist in monitoring and replacing electrolytes in this 44 y.o. female admitted on 03/26/2020 with COVID-19 infection and AKI. Patient is at risk for refeeding syndrome.   Labs:  Sodium (mmol/L)  Date Value  04/04/2020 141  05/11/2012 140   Potassium (mmol/L)  Date Value  04/04/2020 3.6  05/11/2012 3.2 (L)   Magnesium (mg/dL)  Date Value  51/08/2110 1.7   Phosphorus (mg/dL)  Date Value  73/56/7014 3.5   Calcium (mg/dL)  Date Value  09/12/1313 8.1 (L)   Calcium, Total (mg/dL)  Date Value  38/88/7579 9.2   Albumin (g/dL)  Date Value  72/82/0601 2.0 (L)  05/11/2012 3.7   Assessment/Plan:  Electrolytes:  Goal for electrolytes WNL.   Currently on D5LR @75  mL/h, Na remains stable.  No replacement indicated at this time. Will continue to monitor with AM labs and replace as indicated.   Glucose: Poor nutritional intake. Patient remains off of IV steroids. Will continue to monitor glucose trend.  Constipation: LBM 5/17. Patient is not eating well which might be a contributing factor. Currently on miralax and senokot-S. Will continue to monitor.   Pharmacy will continue to monitor and adjust per consult.    6/17, PharmD  04/04/2020 10:59 AM

## 2020-04-04 NOTE — Progress Notes (Signed)
CRITICAL CARE NOTE  PATIENT BACKGROUND: 44 y.o.femalewith a history of GERD and prior stroke with residual left-sided motor deficit who is brought to the ED due to fever and generalized weakness.EMS described the patient is having hypotension prior to arrival, but lowest blood pressure they observed was 95/70. Patient is having lightheadedness nausea and malaise as well as shortness of breath and cough. Admitted with COVID-19 pneumonia, acute oxyntic respiratory failure due to COVID-19, acute renal failure and anemia.  Requiring high flow O2/BiPAP.    EVENTS: 5/14 admitted for COVID pneumonia, anemia and Renal failure 5/14 8L [place], worsening renal function 5/15 very good UOP, 1500 cc's 5/16 good UOP 1200CC's 5/17- patient had episode of respiratory distress with hypoxemia, we discussed this with daughter, high risk for intubation 5/18-  Patient is hemodynamically improved.  She is with confusion and intermittent lethargy.   04/01/20- patient still with confusion, FIO2 requiremnt has improved on 60% 35L/min 04/02/20- patient remains with intermittent confusion, she seems to have component of icu delerium and possible metabolic encephalopathy 04/29/06- mental status clearing.  Started on sildenafil for persistent hypoxemia,in the setting of COVID-19 and evidence of pulmonary hypertension on 2D echo.  FiO2 requirements to 80% with a flow rate of 55 L 04/04/20- inflammatory markers rising started back on low-dose dexamethasone.  O2 and flow requirements decreasing since starting sildenafil.  FiO2 to 70% flow to 45 L.  CC  follow up respiratory failure Renal failure  SUBJECTIVE Mental status clear today Answers questions appropriately On high flow nasal cannula No SOB, resting comfortable Feels better BUN and creatinine continue to improve H/H stable   BP 130/81   Pulse 88   Temp 98.9 F (37.2 C) (Oral)   Resp (!) 24   Ht 5\' 6"  (1.676 m)   Wt 116.2 kg   SpO2 96%   BMI 41.35 kg/m     I/O last 3 completed shifts: In: 4139.7 [P.O.:957; I.V.:2557.7; Other:575; IV Piggyback:50] Out: 1400 [Urine:1400] Total I/O In: 243 [P.O.:240; I.V.:3] Out: -   SpO2: 96 % O2 Flow Rate (L/min): 45 L/min FiO2 (%): 70 %  Estimated body mass index is 41.35 kg/m as calculated from the following:   Height as of this encounter: 5\' 6"  (1.676 m).   Weight as of this encounter: 116.2 kg.   Review of Systems: Voices no other complaints on system review.  Only complaint is that of thirst.   Physical Examination:   General Appearance: Awake, alert, conversant.  Follows commands.  Oriented Neuro:without focal findings,  speech normal HEENT: PERRLA, EOM intact.  Neck: Supple, no JVD, trachea midline. Pulmonary: Difficult assessment with PPE/CAPR.  No tachypnea noted.  Questionable end expiratory wheeze at left upper lung zone again difficult to assess due to CAPR. Cardiovascular: Sinus rhythm on monitor Abdomen: Obese, benign, soft, non-tender. Skin:   warm, no rashes, no ecchymosis  Extremities: normal, no cyanosis, clubbing. PSYCHIATRIC: Flat affect, normal behavior.   MEDICATIONS: I have reviewed all medications and confirmed regimen as documented   CULTURE RESULTS   Recent Results (from the past 240 hour(s))  Culture, blood (Routine x 2)     Status: None   Collection Time: 03/26/20 12:47 PM   Specimen: BLOOD  Result Value Ref Range Status   Specimen Description BLOOD BLOOD LEFT HAND  Final   Special Requests   Final    BOTTLES DRAWN AEROBIC AND ANAEROBIC Blood Culture adequate volume   Culture   Final    NO GROWTH 5 DAYS Performed at [facility], [address] [place], [place] [address]    Report Status 03/31/2020 FINAL  Final  Culture, blood (Routine x 2)     Status: None   Collection Time: 03/26/20 12:47 PM   Specimen: BLOOD  Result Value Ref Range Status   Specimen Description BLOOD LEFT ANTECUBITAL  Final   Special Requests   Final    BOTTLES  DRAWN AEROBIC AND ANAEROBIC Blood Culture adequate volume   Culture   Final    NO GROWTH 5 DAYS Performed at [facility], [address] [place], [place] [address]    Report Status 03/31/2020 FINAL  Final  Urine Culture     Status: Abnormal   Collection Time: 03/26/20  1:55 PM   Specimen: Urine, Random  Result Value Ref Range Status   Specimen Description   Final    URINE, RANDOM Performed at [facility], [address], [place], [place] [address]    Special Requests   Final    NONE Performed at [facility], [address] [place], [place] [address]    Culture >=100,000 COLONIES/mL ESCHERICHIA COLI (A)  Final   Report Status 03/29/2020 FINAL  Final   Organism ID, Bacteria ESCHERICHIA COLI (A)  Final      Susceptibility   Escherichia coli - MIC*    AMPICILLIN <=2 SENSITIVE Sensitive     CEFAZOLIN <=4 SENSITIVE Sensitive     CEFTRIAXONE <=1 SENSITIVE Sensitive     CIPROFLOXACIN <=0.25 SENSITIVE Sensitive     GENTAMICIN <=1 SENSITIVE Sensitive     IMIPENEM <=0.25 SENSITIVE Sensitive     NITROFURANTOIN <=16 SENSITIVE Sensitive     TRIMETH/SULFA <=20 SENSITIVE Sensitive     AMPICILLIN/SULBACTAM <=2 SENSITIVE Sensitive     PIP/TAZO <=4 SENSITIVE Sensitive     * >=100,000 COLONIES/mL ESCHERICHIA COLI  SARS Coronavirus 2 by RT PCR (hospital order, performed in [facility] hospital lab) Nasopharyngeal Nasopharyngeal Swab     Status: Abnormal   Collection Time: 03/26/20  2:47 PM   Specimen: Nasopharyngeal Swab  Result Value Ref Range Status   SARS Coronavirus 2 POSITIVE (A) NEGATIVE Final    Comment: RESULT CALLED TO, READ BACK BY AND VERIFIED WITH: CADY,R AT 1608 ON 03/26/2020 BY MOSLEY,J (NOTE) SARS-CoV-2 target nucleic acids are DETECTED SARS-CoV-2 RNA is generally detectable in upper respiratory specimens  during the acute phase of infection.  Positive results are indicative  of the presence of the identified virus, but do not rule  out bacterial infection or co-infection with other pathogens not detected by the test.  Clinical correlation with patient history and  other diagnostic information is necessary to determine patient infection status.  The expected result is negative. Fact Sheet for Patients:   BoilerBrush.com.cy  Fact Sheet for Healthcare Providers:   https://pope.com/   This test is not yet approved or cleared by the [place] FDA and  has been authorized for detection and/or diagnosis of SARS-CoV-2 by FDA under an Emergency Use Authorization (EUA).  This EUA will remain in effect (meaning this test  can be used) for the duration of  the COVID-19 declaration under Section 564(b)(1) of the Act, 21 U.S.C. section 360-bbb-3(b)(1), unless the authorization is terminated or revoked sooner. Performed at [facility], [address] [place], [place] [address]   MRSA PCR Screening     Status: None   Collection Time: 03/26/20  9:56 PM   Specimen: Nasopharyngeal  Result Value Ref Range Status   MRSA by  PCR NEGATIVE NEGATIVE Final    Comment:        The GeneXpert MRSA Assay (FDA approved for NASAL specimens only), is one component of a comprehensive MRSA colonization surveillance program. It is not intended to diagnose MRSA infection nor to guide or monitor treatment for MRSA infections. Performed at [facility], [address], [place], [place] [address]           IMAGING    No results found.   ASSESSMENT AND PLAN  Acute hypoxic respiratory failure due to COVID-19 COVID-19 pneumonia  Completed IV remdesivir Completed ivermectin Aggressive pulm toilet  OOB to chair as tolerated  Pulmonary hygiene Added sildenafil 5/21 due to persistent hypoxemia Maintain airborne and contact precautions  Alvesco via MDI Vitamin C and zinc Antitussives Restarted low-dose steroids due to increasing inflammatory markers (?  Rebound)   Encephalopathy due to COVID-19 Supportive measures Mental status clearer today  Anemia Iron deficiency anemia NoTTP No overt signs of bleeding H&H stable May need transfusion for increased oxygen carrying capacity Received 1 unit of PRBCs earlier in course  Morbid obesity, possible OSA Pulmonary hypertension on 2D echo  Will certainly impact respiratory mechanics This issue adds complexity to her management  ACUTE KIDNEY INJURY/Renal Failure continue Foley Catheter-assess need Avoid nephrotoxic agents Follow urine output, BMP Ensure adequate renal perfusion, optimize oxygenation Renal dose medications Nephrology following Overall improved  Pulmonary hypertension Diastolic dysfunction These issues add complexity to her management Supportive care  ID E. coli UTI Completed treatment with Rocephin Will DC Foley catheter Pure wick needed  GI GI PROPHYLAXIS as indicated  NUTRITIONAL STATUS Nutrition Status: Nutrition Problem: Increased nutrient needs Etiology: catabolic illness(COVID-19) Signs/Symptoms: estimated needs Interventions: Ensure Enlive (each supplement provides 350kcal and 20 grams of protein), Magic cup   DIET-->as tolerated Constipation protocol as indicated  ENDO - will use ICU hypoglycemic\Hyperglycemia protocol if indicated     ELECTROLYTES -follow labs as needed -replace as needed -pharmacy consultation and following   Care coordination was performed with bedside nurse.  Discussed with RT.   C. Danice Goltz, MD [place] [facility]  *This note was dictated using voice recognition software/Dragon.  Despite best efforts to proofread, errors can occur which can change the meaning.  Any change was purely unintentional.

## 2020-04-05 LAB — HEMOGLOBIN AND HEMATOCRIT, BLOOD
HCT: 26.9 % — ABNORMAL LOW (ref 36.0–46.0)
Hemoglobin: 8.1 g/dL — ABNORMAL LOW (ref 12.0–15.0)

## 2020-04-05 LAB — BASIC METABOLIC PANEL
Anion gap: 9 (ref 5–15)
BUN: 19 mg/dL (ref 6–20)
CO2: 26 mmol/L (ref 22–32)
Calcium: 8.3 mg/dL — ABNORMAL LOW (ref 8.9–10.3)
Chloride: 105 mmol/L (ref 98–111)
Creatinine, Ser: 1.24 mg/dL — ABNORMAL HIGH (ref 0.44–1.00)
GFR calc Af Amer: >60 mL/min (ref >60)
GFR calc non Af Amer: 52 mL/min — ABNORMAL LOW (ref >60)
Glucose, Bld: 123 mg/dL — ABNORMAL HIGH (ref 70–99)
Potassium: 4.1 mmol/L (ref 3.5–5.1)
Sodium: 140 mmol/L (ref 135–145)

## 2020-04-05 LAB — CBC WITH DIFFERENTIAL/PLATELET
Abs Immature Granulocytes: 0.47 10*3/uL — ABNORMAL HIGH (ref 0.00–0.07)
Basophils Absolute: 0 10*3/uL (ref 0.0–0.1)
Basophils Relative: 0 %
Eosinophils Absolute: 0 10*3/uL (ref 0.0–0.5)
Eosinophils Relative: 0 %
HCT: 23.5 % — ABNORMAL LOW (ref 36.0–46.0)
Hemoglobin: 6.9 g/dL — ABNORMAL LOW (ref 12.0–15.0)
Immature Granulocytes: 3 %
Lymphocytes Relative: 6 %
Lymphs Abs: 1 10*3/uL (ref 0.7–4.0)
MCH: 21.4 pg — ABNORMAL LOW (ref 26.0–34.0)
MCHC: 29.4 g/dL — ABNORMAL LOW (ref 30.0–36.0)
MCV: 72.8 fL — ABNORMAL LOW (ref 80.0–100.0)
Monocytes Absolute: 1.3 10*3/uL — ABNORMAL HIGH (ref 0.1–1.0)
Monocytes Relative: 8 %
Neutro Abs: 14 10*3/uL — ABNORMAL HIGH (ref 1.7–7.7)
Neutrophils Relative %: 83 %
Platelets: 371 10*3/uL (ref 150–400)
RBC: 3.23 MIL/uL — ABNORMAL LOW (ref 3.87–5.11)
RDW: 22.8 % — ABNORMAL HIGH (ref 11.5–15.5)
WBC: 16.7 10*3/uL — ABNORMAL HIGH (ref 4.0–10.5)
nRBC: 0 % (ref 0.0–0.2)

## 2020-04-05 LAB — PHOSPHORUS: Phosphorus: 3.8 mg/dL (ref 2.5–4.6)

## 2020-04-05 LAB — FIBRINOGEN: Fibrinogen: >750 mg/dL — ABNORMAL HIGH (ref 210–475)

## 2020-04-05 LAB — C-REACTIVE PROTEIN: CRP: 22.9 mg/dL — ABNORMAL HIGH (ref <1.0)

## 2020-04-05 LAB — FERRITIN: Ferritin: 74 ng/mL (ref 11–307)

## 2020-04-05 LAB — MAGNESIUM: Magnesium: 2 mg/dL (ref 1.7–2.4)

## 2020-04-05 LAB — PREPARE RBC (CROSSMATCH)

## 2020-04-05 MED ORDER — SODIUM CHLORIDE 0.9% IV SOLUTION
Freq: Once | INTRAVENOUS | Status: AC
  Administered 2020-04-05: 10 mL/h via INTRAVENOUS

## 2020-04-05 MED ORDER — AMLODIPINE BESYLATE 5 MG PO TABS
5.0000 mg | ORAL_TABLET | Freq: Every day | ORAL | Status: DC
  Administered 2020-04-05 – 2020-04-10 (×6): 5 mg via ORAL
  Filled 2020-04-05 (×6): qty 1

## 2020-04-05 NOTE — Progress Notes (Signed)
Assisted tele visit to patient with husband.  Williette Loewe R, RN  

## 2020-04-05 NOTE — Progress Notes (Signed)
Pharmacy Electrolyte Monitoring Consult:  Pharmacy consulted to assist in monitoring and replacing electrolytes in this 44 y.o. female admitted on 03/26/2020 with COVID-19 infection and AKI. Patient is at risk for refeeding syndrome.   Labs:  Sodium (mmol/L)  Date Value  04/05/2020 140  05/11/2012 140   Potassium (mmol/L)  Date Value  04/05/2020 4.1  05/11/2012 3.2 (L)   Magnesium (mg/dL)  Date Value  31/49/7026 2.0   Phosphorus (mg/dL)  Date Value  37/85/8850 3.8   Calcium (mg/dL)  Date Value  27/74/1287 8.3 (L)   Calcium, Total (mg/dL)  Date Value  86/76/7209 9.2   Albumin (g/dL)  Date Value  47/07/6282 2.0 (L)  05/11/2012 3.7   Assessment/Plan:  Electrolytes:  Goal for electrolytes WNL.   Currently on D5LR @75  mL/h, Na remains stable.  No replacement indicated at this time. Will continue to monitor with AM labs and replace as indicated.   Glucose: Poor nutritional intake. Patient remains off of IV steroids. Will continue to monitor glucose trend.  Constipation: LBM 5/21. Currently on miralax and senokot-S. Will continue to monitor.   Pharmacy will continue to monitor and adjust per consult.    6/21, PharmD  04/05/2020 8:43 AM

## 2020-04-05 NOTE — Progress Notes (Signed)
Assisted tele visit to patient with husband.  Kynzleigh Bandel M, RN  

## 2020-04-05 NOTE — Care Management (Signed)
This is a no charge note  Pick up from PCCM per Dr. Jayme Cloud  44 year old lady with past medical history of stroke with left-sided weakness, GERD, anxiety, who is admitted to ICU due to COVID-19 pneumonia, AKI, UTI, anemia.  Patient was treated with remdesivir, ivermectin. Not intubated.  Currently is on high flow nasal cannula oxygen.  Hematologist saw patient, no hemolytic anemia.  Patient has iron deficiency.  Received 1 unit of blood transfusion.  Hemoglobin 6.9 today.  Dr. Jayme Cloud will give another unit of blood transfusion and iron supplement.  We need to pick up the patient tomorrow morning  Lorretta Harp, MD  [facility]   If 7PM-7AM, please contact night-coverage [email or web address] 04/05/2020, 1:03 PM

## 2020-04-05 NOTE — Progress Notes (Signed)
Assisted tele visit to patient with husband and son.  Takelia Urieta M, RN

## 2020-04-05 NOTE — Progress Notes (Signed)
Diane Keller  MRN: [id number]  DOB/AGE: 02/26/1976 44 y.o.  Primary Care Physician:Fisher, Demetrios Isaacs, MD  Admit date: 03/26/2020  Chief Complaint:  Chief Complaint  Patient presents with  . Weakness  . Fever    S-Pt presented on  03/26/2020 with  Chief Complaint  Patient presents with  . Weakness  . Fever  . Patient seen in ICU.  Patient resting comfortably in the bed. Patient's husband was in the room via video link.  I discussed patient kidney related  issue with the patient and her spouse after permission  from her.   Medications   . sodium chloride   Intravenous Once  . aerochamber plus with mask  1 each Other Once  . albuterol  1 puff Inhalation Q4H  . B-complex with vitamin C  1 tablet Oral Daily  . buprenorphine-naloxone  1 tablet Sublingual Daily  . Chlorhexidine Gluconate Cloth  6 each Topical Daily  . ciclesonide  4 puff Inhalation BID  . dexamethasone (DECADRON) injection  4 mg Intravenous Q24H  . feeding supplement (ENSURE ENLIVE)  237 mL Oral TID BM  . ipratropium  2 puff Inhalation Q4H  . labetalol  300 mg Oral BID  . polyethylene glycol  17 g Oral Daily  . senna-docusate  2 tablet Oral BID  . sildenafil  20 mg Oral TID  . sodium chloride flush  10-40 mL Intracatheter Q12H  . sodium chloride flush  3 mL Intravenous Q12H         DPO:EUMPN from the symptoms mentioned above,there are no other symptoms referable to all systems reviewed.  Physical Exam: Vital signs in last 24 hours: Temp:  [97.5 F (36.4 C)-98.8 F (37.1 C)] 97.6 F (36.4 C) (05/23 1016) Pulse Rate:  [76-88] 82 (05/23 1016) Resp:  [16-27] 23 (05/23 1016) BP: (120-185)/(67-114) 174/100 (05/23 1016) SpO2:  [89 %-99 %] 94 % (05/23 1016) FiO2 (%):  [55 %-56 %] 55 % (05/22 1949) Weight:  [116.9 kg] 116.9 kg (05/23 0500) Weight change: 0.7 kg Last BM Date: 04/03/20  Intake/Output from previous day: 05/22 0701 - 05/23 0700 In: 2796.7 [P.O.:1200; I.V.:1496.7; IV  Piggyback:100] Out: 1950 [Urine:1950] Total I/O In: 243 [P.O.:240; I.V.:3] Out: -    Physical Exam: General- pt is awake,alert, follows commands  resp- No acute REsp distress Rhonchi present CVS- S1S2 regular in rate and rhythm GIT- BS+, soft, NT, ND EXT- NO LE Edema, no cyanosis   Lab Results: CBC Recent Labs    04/04/20 0510 04/05/20 0413  WBC 17.6* 16.7*  HGB 7.3* 6.9*  HCT 24.4* 23.5*  PLT 394 371    BMET Recent Labs    04/04/20 0510 04/05/20 0413  NA 141 140  K 3.6 4.1  CL 105 105  CO2 30 26  GLUCOSE 109* 123*  BUN 20 19  CREATININE 1.22* 1.24*  CALCIUM 8.1* 8.3*    Creatinine trend Mar 26 2020 7.3 Mar 27, 2020 6.5 Mar 28, 2020 5.3 Mar 29 1020 3.9 May 17 to May 2021 it has slowly decreased from 3.9  to1.6 Apr 04, 2020 1.2 Apr 05 2020  1.2  MICRO Recent Results (from the past 240 hour(s))  Culture, blood (Routine x 2)     Status: None   Collection Time: 03/26/20 12:47 PM   Specimen: BLOOD  Result Value Ref Range Status   Specimen Description BLOOD BLOOD LEFT HAND  Final   Special Requests   Final    BOTTLES DRAWN AEROBIC AND ANAEROBIC Blood Culture  adequate volume   Culture   Final    NO GROWTH 5 DAYS Performed at [facility], [address] [place], [place] [address]    Report Status 03/31/2020 FINAL  Final  Culture, blood (Routine x 2)     Status: None   Collection Time: 03/26/20 12:47 PM   Specimen: BLOOD  Result Value Ref Range Status   Specimen Description BLOOD LEFT ANTECUBITAL  Final   Special Requests   Final    BOTTLES DRAWN AEROBIC AND ANAEROBIC Blood Culture adequate volume   Culture   Final    NO GROWTH 5 DAYS Performed at [facility], [address] [place], [place] [address]    Report Status 03/31/2020 FINAL  Final  Urine Culture     Status: Abnormal   Collection Time: 03/26/20  1:55 PM   Specimen: Urine, Random  Result Value Ref Range Status   Specimen Description   Final    URINE,  RANDOM Performed at [facility], [address] [place], [place] [address]    Special Requests   Final    NONE Performed at [facility], [place], [place] [address]    Culture >=100,000 COLONIES/mL ESCHERICHIA COLI (A)  Final   Report Status 03/29/2020 FINAL  Final   Organism ID, Bacteria ESCHERICHIA COLI (A)  Final      Susceptibility   Escherichia coli - MIC*    AMPICILLIN <=2 SENSITIVE Sensitive     CEFAZOLIN <=4 SENSITIVE Sensitive     CEFTRIAXONE <=1 SENSITIVE Sensitive     CIPROFLOXACIN <=0.25 SENSITIVE Sensitive     GENTAMICIN <=1 SENSITIVE Sensitive     IMIPENEM <=0.25 SENSITIVE Sensitive     NITROFURANTOIN <=16 SENSITIVE Sensitive     TRIMETH/SULFA <=20 SENSITIVE Sensitive     AMPICILLIN/SULBACTAM <=2 SENSITIVE Sensitive     PIP/TAZO <=4 SENSITIVE Sensitive     * >=100,000 COLONIES/mL ESCHERICHIA COLI  SARS Coronavirus 2 by RT PCR (hospital order, performed in [facility] hospital lab) Nasopharyngeal Nasopharyngeal Swab     Status: Abnormal   Collection Time: 03/26/20  2:47 PM   Specimen: Nasopharyngeal Swab  Result Value Ref Range Status   SARS Coronavirus 2 POSITIVE (A) NEGATIVE Final    Comment: RESULT CALLED TO, READ BACK BY AND VERIFIED WITH: CADY,R AT 1608 ON 03/26/2020 BY MOSLEY,J (NOTE) SARS-CoV-2 target nucleic acids are DETECTED SARS-CoV-2 RNA is generally detectable in upper respiratory specimens  during the acute phase of infection.  Positive results are indicative  of the presence of the identified virus, but do not rule out bacterial infection or co-infection with other pathogens not detected by the test.  Clinical correlation with patient history and  other diagnostic information is necessary to determine patient infection status.  The expected result is negative. Fact Sheet for Patients:   StrictlyIdeas.no  Fact Sheet for Healthcare Providers:   BankingDealers.co.za    This test is not yet approved or cleared by the [place] FDA and  has been authorized for detection and/or diagnosis of SARS-CoV-2 by FDA under an Emergency Use Authorization (EUA).  This EUA will remain in effect (meaning this test  can be used) for the duration of  the COVID-19 declaration under Section 564(b)(1) of the Act, 21 U.S.C. section 360-bbb-3(b)(1), unless the authorization is terminated or revoked sooner. Performed at [facility], [address], [place], [place] [address]   MRSA PCR Screening     Status: None   Collection Time:  03/26/20  9:56 PM   Specimen: Nasopharyngeal  Result Value Ref Range Status   MRSA by PCR NEGATIVE NEGATIVE Final    Comment:        The GeneXpert MRSA Assay (FDA approved for NASAL specimens only), is one component of a comprehensive MRSA colonization surveillance program. It is not intended to diagnose MRSA infection nor to guide or monitor treatment for MRSA infections. Performed at [facility], [address] [place], [place] [address]       Lab Results  Component Value Date   CALCIUM 8.3 (L) 04/05/2020   PHOS 3.8 04/05/2020               Impression:   Patient is a 44 year old African-American female with a past medical history of anxiety, GERD, history of CVA who was admitted to the hospital with chief complaint of Covid 19 infection and AKI.   1)Renal  AKI secondary to ATN AKI secondary to Covid AKI is now better Patient creatinine is now much better  2)  hypertension Blood pressure is uncontrolled Patient is on labetalol 3 mg p.o. 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 Patient is being closely followed by the hematology/oncology and the primary team   4) Covid positive Patient is being followed closely by the critical care team  5) electrolytes Normokalemic NOrmonatremic   6)Acid base Patient had a known nongap  acidosis and nongap acidosis earlier Patient is now much better  Plan:  No need for renal placement therapy We will start patient on amlodipine for uncontrolled hypertension     Teofilo Lupinacci s [facility] 04/05/2020, 10:55 AM

## 2020-04-05 NOTE — Progress Notes (Signed)
Patient refused to attempt to get out of bed and sit in a chair at this time.  Educated patient on the importance of getting out of bed.  Will ask patient again later in the shift.

## 2020-04-05 NOTE — Progress Notes (Addendum)
CRITICAL CARE NOTE  PATIENT BACKGROUND: 44 y.o.femalewith a history of GERD and prior stroke with residual left-sided motor deficit who is brought to the ED due to fever and generalized weakness.EMS described the patient is having hypotension prior to arrival, but lowest blood pressure they observed was 95/70. Patient is having lightheadedness nausea and malaise as well as shortness of breath and cough. Admitted with COVID-19 pneumonia, acute oxyntic respiratory failure due to COVID-19, acute renal failure and anemia.  Requiring high flow O2/BiPAP.    EVENTS: 5/14 admitted for COVID pneumonia, anemia and Renal failure 5/14 8L [place], worsening renal function 5/15 very good UOP, 1500 cc's 5/16 good UOP 1200CC's 5/17- patient had episode of respiratory distress with hypoxemia, we discussed this with daughter, high risk for intubation 5/18-  Patient is hemodynamically improved.  She is with confusion and intermittent lethargy.   04/01/20- patient still with confusion, FIO2 requiremnt has improved on 60% 35L/min 04/02/20- patient remains with intermittent confusion, she seems to have component of icu delerium and possible metabolic encephalopathy 04/03/20- mental status clearing.  Started on sildenafil for persistent hypoxemia,in the setting of COVID-19 and evidence of pulmonary hypertension on 2D echo.  FiO2 requirements to 80% with a flow rate of 55 L 04/04/20- inflammatory markers rising started back on low-dose dexamethasone.  O2 and flow requirements decreasing since starting sildenafil.  FiO2 to 70% flow to 45 L. 04/05/20- wean down to 8 L bubble high flow.  Comfortable.  Received another unit of PRBCs.  Mental status clear.  CC  follow up respiratory failure Renal failure  SUBJECTIVE Mental status clear today Answers questions appropriately She is on video visit with her husband and I was able to answer his questions as well On bubble high flow nasal cannula -8 L No SOB, resting  comfortable Feels better BUN and creatinine continue to improve H/H dropped to 6.9 and 23 (same as admission)   BP (!) 182/111 Comment: PRN labetalol given  Pulse 79   Temp (!) 97.4 F (36.3 C) (Oral)   Resp (!) 22   Ht 5\' 6"  (1.676 m)   Wt 116.9 kg   SpO2 98%   BMI 41.60 kg/m    I/O last 3 completed shifts: In: 3959.7 [P.O.:1200; I.V.:2309.7; Other:350; IV Piggyback:100] Out: 1950 [Urine:1950] Total I/O In: 568 [P.O.:240; I.V.:3; Blood:325] Out: -   SpO2: 98 % O2 Flow Rate (L/min): 8 L/min FiO2 (%): 55 %  Estimated body mass index is 41.6 kg/m as calculated from the following:   Height as of this encounter: 5\' 6"  (1.676 m).   Weight as of this encounter: 116.9 kg.   Review of Systems: Voices no other complaints on system review.  States she feels "good" today.   Physical Examination:   General Appearance: Awake, alert, conversant.  Follows commands.  Oriented. Neuro:without focal findings,  speech normal HEENT: PERRLA, EOM intact.  Neck: Supple, no JVD, trachea midline. Pulmonary: Difficult assessment with PPE/CAPR.  No tachypnea noted Cardiovascular: Sinus rhythm on monitor Abdomen: Obese, benign, soft, non-tender. Skin:   warm, no rashes, no ecchymosis  Extremities: normal, no cyanosis, clubbing. PSYCHIATRIC: Flat affect, normal behavior.   MEDICATIONS: I have reviewed all medications and confirmed regimen as documented   CULTURE RESULTS   Recent Results (from the past 240 hour(s))  Culture, blood (Routine x 2)     Status: None   Collection Time: 03/26/20 12:47 PM   Specimen: BLOOD  Result Value Ref Range Status   Specimen Description BLOOD BLOOD LEFT HAND  Final   Special Requests   Final    BOTTLES DRAWN AEROBIC AND ANAEROBIC Blood Culture adequate volume   Culture   Final    NO GROWTH 5 DAYS Performed at [facility], [address] [place], [place] [address]    Report Status 03/31/2020 FINAL  Final  Culture, blood (Routine x 2)      Status: None   Collection Time: 03/26/20 12:47 PM   Specimen: BLOOD  Result Value Ref Range Status   Specimen Description BLOOD LEFT ANTECUBITAL  Final   Special Requests   Final    BOTTLES DRAWN AEROBIC AND ANAEROBIC Blood Culture adequate volume   Culture   Final    NO GROWTH 5 DAYS Performed at [facility], [address] [place], [place] [address]    Report Status 03/31/2020 FINAL  Final  Urine Culture     Status: Abnormal   Collection Time: 03/26/20  1:55 PM   Specimen: Urine, Random  Result Value Ref Range Status   Specimen Description   Final    URINE, RANDOM Performed at [facility], [address] [place], [place] [address]    Special Requests   Final    NONE Performed at [facility], [address] [place], [place] [address]    Culture >=100,000 COLONIES/mL ESCHERICHIA COLI (A)  Final   Report Status 03/29/2020 FINAL  Final   Organism ID, Bacteria ESCHERICHIA COLI (A)  Final      Susceptibility   Escherichia coli - MIC*    AMPICILLIN <=2 SENSITIVE Sensitive     CEFAZOLIN <=4 SENSITIVE Sensitive     CEFTRIAXONE <=1 SENSITIVE Sensitive     CIPROFLOXACIN <=0.25 SENSITIVE Sensitive     GENTAMICIN <=1 SENSITIVE Sensitive     IMIPENEM <=0.25 SENSITIVE Sensitive     NITROFURANTOIN <=16 SENSITIVE Sensitive     TRIMETH/SULFA <=20 SENSITIVE Sensitive     AMPICILLIN/SULBACTAM <=2 SENSITIVE Sensitive     PIP/TAZO <=4 SENSITIVE Sensitive     * >=100,000 COLONIES/mL ESCHERICHIA COLI  SARS Coronavirus 2 by RT PCR (hospital order, performed in [facility] hospital lab) Nasopharyngeal Nasopharyngeal Swab     Status: Abnormal   Collection Time: 03/26/20  2:47 PM   Specimen: Nasopharyngeal Swab  Result Value Ref Range Status   SARS Coronavirus 2 POSITIVE (A) NEGATIVE Final    Comment: RESULT CALLED TO, READ BACK BY AND VERIFIED WITH: CADY,R AT 1608 ON 03/26/2020 BY MOSLEY,J (NOTE) SARS-CoV-2 target nucleic acids are DETECTED SARS-CoV-2 RNA  is generally detectable in upper respiratory specimens  during the acute phase of infection.  Positive results are indicative  of the presence of the identified virus, but do not rule out bacterial infection or co-infection with other pathogens not detected by the test.  Clinical correlation with patient history and  other diagnostic information is necessary to determine patient infection status.  The expected result is negative. Fact Sheet for Patients:   BoilerBrush.com.cy  Fact Sheet for Healthcare Providers:   https://pope.com/   This test is not yet approved or cleared by the [place] FDA and  has been authorized for detection and/or diagnosis of SARS-CoV-2 by FDA under an Emergency Use Authorization (EUA).  This EUA will remain in effect (meaning this test  can be used) for the duration of  the COVID-19 declaration under Section 564(b)(1) of the Act, 21 U.S.C. section 360-bbb-3(b)(1), unless the authorization is terminated or revoked sooner. Performed at [facility], [address]  [place], [place] [address]   MRSA PCR Screening     Status: None   Collection Time: 03/26/20  9:56 PM   Specimen: Nasopharyngeal  Result Value Ref Range Status   MRSA by PCR NEGATIVE NEGATIVE Final    Comment:        The GeneXpert MRSA Assay (FDA approved for NASAL specimens only), is one component of a comprehensive MRSA colonization surveillance program. It is not intended to diagnose MRSA infection nor to guide or monitor treatment for MRSA infections. Performed at [facility], [address] [place], [place] [address]    Results for orders placed or performed during the hospital encounter of 03/26/20 (from the past 24 hour(s))  Basic metabolic panel     Status: Abnormal   Collection Time: 04/05/20  4:13 AM  Result Value Ref Range   Sodium 140 135 - 145 mmol/L   Potassium 4.1 3.5 - 5.1 mmol/L   Chloride 105 98  - 111 mmol/L   CO2 26 22 - 32 mmol/L   Glucose, Bld 123 (H) 70 - 99 mg/dL   BUN 19 6 - 20 mg/dL   Creatinine, Ser 1.24 (H) 0.44 - 1.00 mg/dL   Calcium 8.3 (L) 8.9 - 10.3 mg/dL   GFR calc non Af Amer 52 (L) >60 mL/min   GFR calc Af Amer >60 >60 mL/min   Anion gap 9 5 - 15  Magnesium     Status: None   Collection Time: 04/05/20  4:13 AM  Result Value Ref Range   Magnesium 2.0 1.7 - 2.4 mg/dL  Phosphorus     Status: None   Collection Time: 04/05/20  4:13 AM  Result Value Ref Range   Phosphorus 3.8 2.5 - 4.6 mg/dL  CBC with Differential/Platelet     Status: Abnormal   Collection Time: 04/05/20  4:13 AM  Result Value Ref Range   WBC 16.7 (H) 4.0 - 10.5 K/uL   RBC 3.23 (L) 3.87 - 5.11 MIL/uL   Hemoglobin 6.9 (L) 12.0 - 15.0 g/dL   HCT 23.5 (L) 36.0 - 46.0 %   MCV 72.8 (L) 80.0 - 100.0 fL   MCH 21.4 (L) 26.0 - 34.0 pg   MCHC 29.4 (L) 30.0 - 36.0 g/dL   RDW 22.8 (H) 11.5 - 15.5 %   Platelets 371 150 - 400 K/uL   nRBC 0.0 0.0 - 0.2 %   Neutrophils Relative % 83 %   Neutro Abs 14.0 (H) 1.7 - 7.7 K/uL   Lymphocytes Relative 6 %   Lymphs Abs 1.0 0.7 - 4.0 K/uL   Monocytes Relative 8 %   Monocytes Absolute 1.3 (H) 0.1 - 1.0 K/uL   Eosinophils Relative 0 %   Eosinophils Absolute 0.0 0.0 - 0.5 K/uL   Basophils Relative 0 %   Basophils Absolute 0.0 0.0 - 0.1 K/uL   Immature Granulocytes 3 %   Abs Immature Granulocytes 0.47 (H) 0.00 - 0.07 K/uL  Fibrinogen     Status: Abnormal   Collection Time: 04/05/20  4:13 AM  Result Value Ref Range   Fibrinogen >750 (H) 210 - 475 mg/dL  C-reactive protein     Status: Abnormal   Collection Time: 04/05/20  4:13 AM  Result Value Ref Range   CRP 22.9 (H) <1.0 mg/dL  Ferritin     Status: None   Collection Time: 04/05/20  4:13 AM  Result Value Ref Range   Ferritin 74 11 - 307 ng/mL  Prepare RBC (crossmatch)  Status: None   Collection Time: 04/05/20  5:39 AM  Result Value Ref Range   Order Confirmation      ORDER PROCESSED BY BLOOD  BANK Performed at [facility], [address] [place], [place] [address]   Hemoglobin and hematocrit, blood     Status: Abnormal   Collection Time: 04/05/20  3:33 PM  Result Value Ref Range   Hemoglobin 8.1 (L) 12.0 - 15.0 g/dL   HCT 14.4 (L) 81.8 - 56.3 %       IMAGING    No results found.   ASSESSMENT AND PLAN  Acute hypoxic respiratory failure due to COVID-19 COVID-19 pneumonia  Completed IV remdesivir Completed ivermectin Aggressive pulm toilet  OOB to chair as tolerated  Pulmonary hygiene Added sildenafil 5/21 due to persistent hypoxemia Maintain airborne and contact precautions  [place] via MDI Vitamin C and zinc Antitussives Restarted low-dose steroids due to increasing inflammatory markers (? Rebound)   Encephalopathy due to COVID-19 Supportive measures Mental status continues to clear  Anemia Iron deficiency anemia Very low iron stores Hx: Menorrhagia NoTTP No overt signs of bleeding Transfused 1 unit PRBCs today Consider IV iron supplementation prior to discharge  Morbid obesity, possible OSA Pulmonary hypertension on 2D echo  Will certainly impact respiratory mechanics This issue adds complexity to her management  ACUTE KIDNEY INJURY/Renal Failure Avoid nephrotoxic agents Follow urine output, BMP Ensure adequate renal perfusion, optimize oxygenation Nephrology following Continues to improve  Pulmonary hypertension Diastolic dysfunction These issues add complexity to her management Supportive care  ID E. coli UTI Completed treatment with Rocephin Foley catheter discontinued 5/22 Pure wick placed  GI GI PROPHYLAXIS as indicated  NUTRITIONAL STATUS Nutrition Status: Nutrition Problem: Increased nutrient needs Etiology: catabolic illness(COVID-19) Signs/Symptoms: estimated needs Interventions: Ensure Enlive (each supplement provides 350kcal and 20 grams of protein), Magic cup   DIET-->as tolerated Constipation  protocol as indicated  ENDO - will use ICU hypoglycemic\Hyperglycemia protocol if indicated   ELECTROLYTES -follow labs as needed -replace as needed -pharmacy consultation and following  Disposition: Switch to stepdown status.  Care coordination was performed with bedside nurse.  Discussed with RT.  Transfer to [facility] hospitalist service in the morning.  Gailen Shelter, MD [facility]  *This note was dictated using voice recognition software/Dragon.  Despite best efforts to proofread, errors can occur which can change the meaning.  Any change was purely unintentional.

## 2020-04-05 NOTE — Progress Notes (Signed)
Patient has not voided since her in and out cath at 0700.  Bladder scanned at this time for .  Patient states that "she wants to keep trying to pee and refuses the in and out catheter". Patient educated on the importance of urinating.

## 2020-04-05 NOTE — Progress Notes (Signed)
End of Shift Summary:     Patient has been alert and oriented x 4 throughout the shift.  Vitals stable, afebrile.  Received 1 unit of PRBCs and 1 dose of PRN labetalol for SBP in the 180s.  Patient has denied pain this shift and appears in no respiratory distress.  I was able to wean her off of the HFNC and onto [place] at 6L.  Patient currently sating 92%.  Patient refused both attempts of getting her to sit up in a chair this day.  Patient refused an in and out cath this evening, stating that she wanted to "keep trying to pee".  Patient was bladder scanned for 370 ml of urine.

## 2020-04-06 DIAGNOSIS — D500 Iron deficiency anemia secondary to blood loss (chronic): Secondary | ICD-10-CM

## 2020-04-06 DIAGNOSIS — J9601 Acute respiratory failure with hypoxia: Secondary | ICD-10-CM

## 2020-04-06 LAB — BASIC METABOLIC PANEL
Anion gap: 7 (ref 5–15)
BUN: 19 mg/dL (ref 6–20)
CO2: 27 mmol/L (ref 22–32)
Calcium: 8.2 mg/dL — ABNORMAL LOW (ref 8.9–10.3)
Chloride: 107 mmol/L (ref 98–111)
Creatinine, Ser: 0.94 mg/dL (ref 0.44–1.00)
GFR calc Af Amer: >60 mL/min (ref >60)
GFR calc non Af Amer: >60 mL/min (ref >60)
Glucose, Bld: 104 mg/dL — ABNORMAL HIGH (ref 70–99)
Potassium: 3.5 mmol/L (ref 3.5–5.1)
Sodium: 141 mmol/L (ref 135–145)

## 2020-04-06 LAB — CBC WITH DIFFERENTIAL/PLATELET
Abs Immature Granulocytes: 0.59 10*3/uL — ABNORMAL HIGH (ref 0.00–0.07)
Basophils Absolute: 0 10*3/uL (ref 0.0–0.1)
Basophils Relative: 0 %
Eosinophils Absolute: 0 10*3/uL (ref 0.0–0.5)
Eosinophils Relative: 0 %
HCT: 27.4 % — ABNORMAL LOW (ref 36.0–46.0)
Hemoglobin: 8 g/dL — ABNORMAL LOW (ref 12.0–15.0)
Immature Granulocytes: 4 %
Lymphocytes Relative: 8 %
Lymphs Abs: 1.3 10*3/uL (ref 0.7–4.0)
MCH: 21.9 pg — ABNORMAL LOW (ref 26.0–34.0)
MCHC: 29.2 g/dL — ABNORMAL LOW (ref 30.0–36.0)
MCV: 74.9 fL — ABNORMAL LOW (ref 80.0–100.0)
Monocytes Absolute: 1.3 10*3/uL — ABNORMAL HIGH (ref 0.1–1.0)
Monocytes Relative: 8 %
Neutro Abs: 12.5 10*3/uL — ABNORMAL HIGH (ref 1.7–7.7)
Neutrophils Relative %: 80 %
Platelets: 367 10*3/uL (ref 150–400)
RBC: 3.66 MIL/uL — ABNORMAL LOW (ref 3.87–5.11)
RDW: 22.5 % — ABNORMAL HIGH (ref 11.5–15.5)
WBC: 15.7 10*3/uL — ABNORMAL HIGH (ref 4.0–10.5)
nRBC: 0 % (ref 0.0–0.2)

## 2020-04-06 LAB — BPAM RBC
Blood Product Expiration Date: 202105252359
Blood Product Expiration Date: 202106052359
ISSUE DATE / TIME: 202105201238
ISSUE DATE / TIME: 202105230942
Unit Type and Rh: 1700
Unit Type and Rh: 7300

## 2020-04-06 LAB — PHOSPHORUS: Phosphorus: 3.1 mg/dL (ref 2.5–4.6)

## 2020-04-06 LAB — TYPE AND SCREEN
ABO/RH(D): B POS
Antibody Screen: NEGATIVE
Unit division: 0
Unit division: 0

## 2020-04-06 LAB — MAGNESIUM: Magnesium: 1.9 mg/dL (ref 1.7–2.4)

## 2020-04-06 MED ORDER — SODIUM CHLORIDE 0.9 % IV SOLN
400.0000 mg | Freq: Once | INTRAVENOUS | Status: AC
  Administered 2020-04-06: 400 mg via INTRAVENOUS
  Filled 2020-04-06: qty 20

## 2020-04-06 MED ORDER — POTASSIUM CHLORIDE 20 MEQ PO PACK
40.0000 meq | PACK | Freq: Once | ORAL | Status: AC
  Administered 2020-04-06: 40 meq via ORAL

## 2020-04-06 NOTE — Progress Notes (Signed)
Pharmacy Electrolyte Monitoring Consult:  Pharmacy consulted to assist in monitoring and replacing electrolytes in this 44 y.o. female admitted on 03/26/2020 with COVID-19 infection and AKI. Patient is at risk for refeeding syndrome.   Labs:  Sodium (mmol/L)  Date Value  04/06/2020 141  05/11/2012 140   Potassium (mmol/L)  Date Value  04/06/2020 3.5  05/11/2012 3.2 (L)   Magnesium (mg/dL)  Date Value  19/75/8832 1.9   Phosphorus (mg/dL)  Date Value  54/98/2641 3.1   Calcium (mg/dL)  Date Value  58/30/9407 8.2 (L)   Calcium, Total (mg/dL)  Date Value  68/06/8109 9.2   Albumin (g/dL)  Date Value  31/59/4585 2.0 (L)  05/11/2012 3.7   Corrected Ca: 9.8 mg/dL  Assessment/Plan:  Electrolytes:  Goal for electrolytes WNL   Currently on D5LR @75  mL/h: stopped  40 mEq oral KCl per Dr  No further replacement indicated at this time.  continue to monitor with 5/25 AM labs and replace as indicated   Glucose: Poor nutritional intake  Day # 3 IV steroids: CBG controlled  Will continue to monitor glucose trend.  Constipation:   LBM 5/21  Currently on miralax and senokot-S  Will continue to monitor   6/21, PharmD  04/06/2020 7:19 AM

## 2020-04-06 NOTE — Progress Notes (Signed)
Assisted tele visit to patient with husband.  Cameron D Howard, RN  

## 2020-04-06 NOTE — Progress Notes (Signed)
Vital signs reviewed, ICU needs resolved  Will sign off at this time. No further recommendations at this time.  Please call [phone] for further questions. Thank you.    Andren Bethea David Iver Fehrenbach, M.D.  [facility]  Medical Director ICU-[facility] Medical Director [facility]io[facility] Department   

## 2020-04-06 NOTE — Progress Notes (Signed)
PROGRESS NOTE    Diane Keller  ION:[id number] DOB: Mar 18, 1976 DOA: 03/26/2020 PCP: Birdie Sons, MD  Complaints.  Shortness of breath.  Brief Narrative: Patient is a 44 year old female with history of acid reflex and history of stroke with residual left-sided weakness who present to the emergency room on 5/13 with generalized weakness and a fever.  She was hypotensive upon arrival.  She was diagnosed with severe COVID-19  Pneumonia, with secondary bacterial pneumonia, acute kidney injury and hypovolemic shock, and acute hypoxemic respite failure.  Patient has completed steroids and remdesivir.  Patient never required hemodialysis.  Did not require intubation.  Assessment & Plan:   Active Problems:   Renal failure  #1.  Iron deficient anemia. Hemoglobin seem to be stable, patient has a very low iron store.  Will give IV iron.  #2.  Acute hypoxemic respiratory failure secondary to COVID-19 pneumonia. Patient is still on high flow oxygen.  But overall had improved.  Reviewed echocardiogram, ejection fraction is normal, mild pulmonary hypertension.  #3.  Acute kidney injury. Renal function had normalized.  4.  Chronic diastolic congestive heart failure with mild pulmonary hypertension. We will check a BNP, will give with diuretics if a BNP is still elevated.  5.  Morbid obesity.  6.  E. coli UTI. Completed antibiotics.       DVT prophylaxis: SCDs Code Status: Full Family Communication: None Disposition Plan:  . Patient came from: Home           . Anticipated d/c place: Home . Barriers to d/c OR conditions which need to be met to effect a safe d/c:   Consultants:   Nephrology  Cardiology  Pulmonology  Procedures:None     Antimicrobials: None   Subjective: Patient is doing well.  She is on high flow oxygen, does not feel any shortness of breath.  No cough.  No fever or chills.  Had a normal bowel movement yesterday, no abdominal pain or nausea  vomiting.  Objective: Vitals:   04/06/20 0500 04/06/20 0600 04/06/20 0700 04/06/20 0745  BP: (!) 144/91 (!) 145/91 (!) 160/100   Pulse: 78 76 82   Resp: (!) 21 19 (!) 21   Temp:      TempSrc:      SpO2: 92% 95% 94% 96%  Weight:      Height:        Intake/Output Summary (Last 24 hours) at 04/06/2020 1309 Last data filed at 04/06/2020 0548 Gross per 24 hour  Intake 2439.47 ml  Output 725 ml  Net 1714.47 ml   Filed Weights   04/04/20 0500 04/05/20 0500 04/06/20 0410  Weight: 116.2 kg 116.9 kg 121.8 kg    Examination:  General exam: Appears calm and comfortable  Morbid obese Respiratory system: Clear to auscultation. Respiratory effort normal. Cardiovascular system: S1 & S2 heard, RRR. No JVD, murmurs, rubs, gallops or clicks. No pedal edema. Gastrointestinal system: Abdomen is nondistended, soft and nontender. No organomegaly or masses felt. Normal bowel sounds heard. Central nervous system: Alert and oriented. No focal neurological deficits. Extremities: Symmetric 5 x 5 power. Skin: No rashes, lesions or ulcers Psychiatry: Judgement and insight appear normal. Mood & affect appropriate.     Data Reviewed: I have personally reviewed following labs and imaging studies  CBC: Recent Labs  Lab 04/02/20 0332 04/02/20 1855 04/03/20 0422 04/04/20 0510 04/05/20 0413 04/05/20 1533 04/06/20 0411  WBC 15.6*  --  19.1* 17.6* 16.7*  --  15.7*  NEUTROABS  --   --   --  14.6* 14.0*  --  12.5*  HGB 6.7*   < > 7.3* 7.3* 6.9* 8.1* 8.0*  HCT 22.9*   < > 24.3* 24.4* 23.5* 26.9* 27.4*  MCV 70.5*  --  71.5* 70.9* 72.8*  --  74.9*  PLT 556*  --  449* 394 371  --  367   < > = values in this interval not displayed.   Basic Metabolic Panel: Recent Labs  Lab 04/02/20 0332 04/03/20 0422 04/04/20 0510 04/05/20 0413 04/06/20 0411  NA 146* 143 141 140 141  K 3.6 3.8 3.6 4.1 3.5  CL 105 104 105 105 107  CO2 33* '30 30 26 27  ' GLUCOSE 100* 111* 109* 123* 104*  BUN 30* 24* '20 19 19    ' CREATININE 1.64* 1.60* 1.22* 1.24* 0.94  CALCIUM 8.0* 7.9* 8.1* 8.3* 8.2*  MG 1.7 2.0 1.7 2.0 1.9  PHOS 2.6 3.3 3.5 3.8 3.1   GFR: Estimated Creatinine Clearance: 99.5 mL/min (by C-G formula based on SCr of 0.94 mg/dL). Liver Function Tests: Recent Labs  Lab 03/31/20 0558 04/01/20 0457 04/02/20 0332 04/03/20 0422  AST 52* 40 34 30  ALT '24 21 16 11  ' ALKPHOS 58 62 64 71  BILITOT 0.4 0.5 0.4 0.6  PROT 6.1* 6.6 6.2* 6.2*  ALBUMIN 2.4* 2.2* 2.1* 2.0*   No results for input(s): LIPASE, AMYLASE in the last 168 hours. Recent Labs  Lab 03/31/20 1200  AMMONIA 19   Coagulation Profile: No results for input(s): INR, PROTIME in the last 168 hours. Cardiac Enzymes: No results for input(s): CKTOTAL, CKMB, CKMBINDEX, TROPONINI in the last 168 hours. BNP (last 3 results) No results for input(s): PROBNP in the last 8760 hours. HbA1C: No results for input(s): HGBA1C in the last 72 hours. CBG: Recent Labs  Lab 03/30/20 2050  GLUCAP 114*   Lipid Profile: No results for input(s): CHOL, HDL, LDLCALC, TRIG, CHOLHDL, LDLDIRECT in the last 72 hours. Thyroid Function Tests: No results for input(s): TSH, T4TOTAL, FREET4, T3FREE, THYROIDAB in the last 72 hours. Anemia Panel: Recent Labs    04/05/20 0413  FERRITIN 74   Sepsis Labs: Recent Labs  Lab 04/01/20 0457  PROCALCITON 2.26    No results found for this or any previous visit (from the past 240 hour(s)).       Radiology Studies: No results found.      Scheduled Meds: . sodium chloride   Intravenous Once  . aerochamber plus with mask  1 each Other Once  . albuterol  1 puff Inhalation Q4H  . amLODipine  5 mg Oral Daily  . B-complex with vitamin C  1 tablet Oral Daily  . buprenorphine-naloxone  1 tablet Sublingual Daily  . Chlorhexidine Gluconate Cloth  6 each Topical Daily  . ciclesonide  4 puff Inhalation BID  . dexamethasone (DECADRON) injection  4 mg Intravenous Q24H  . feeding supplement (ENSURE ENLIVE)  237  mL Oral TID BM  . ipratropium  2 puff Inhalation Q4H  . labetalol  300 mg Oral BID  . polyethylene glycol  17 g Oral Daily  . senna-docusate  2 tablet Oral BID  . sildenafil  20 mg Oral TID  . sodium chloride flush  10-40 mL Intracatheter Q12H  . sodium chloride flush  3 mL Intravenous Q12H   Continuous Infusions: . sodium chloride    . thiamine injection Stopped (04/05/20 1233)     LOS: 11 days    Time spent: 35 minutes    Sharen Hones, MD  [facility]   To contact the attending provider between 7A-7P or the covering provider during after hours 7P-7A, please log into the web site [email or web address] and access using universal [place] password for that web site. If you do not have the password, please call the hospital operator.  04/06/2020, 1:09 PM

## 2020-04-06 NOTE — Progress Notes (Signed)
   04/05/20 1735  Clinical Encounter Type  Visited With Patient  Visit Type Follow-up  Referral From Nurse  Consult/Referral To Chaplain  Spiritual Encounters  Spiritual Needs Prayer  Pt is Covid-19 positive. CH prayed with pt over phone. Pastoral prayer was appreciated.

## 2020-04-06 NOTE — Progress Notes (Signed)
Assisted tele visit to patient with husband.  Janus Vlcek P, RN  

## 2020-04-06 NOTE — NC FL2 (Signed)
B and E LEVEL OF CARE SCREENING TOOL     IDENTIFICATION  Patient Name: Diane Keller Birthdate: Aug 10, 1976 Sex: female Admission Date (Current Location): 03/26/2020  [place] and [place] Number:  Engineering geologist and Address:  [facility], [address] [place], [place] [address]      Provider Number: [id number]  Attending Physician Name and Address:  Sharen Hones, MD  Relative Name and Phone Number:  Kendria, Halberg - Spouse - [phone]    Current Level of Care: Hospital Recommended Level of Care: [place] Prior Approval Number:    Date Approved/Denied:   PASRR Number:    Discharge Plan: Home    Current Diagnoses: Patient Active Problem List   Diagnosis Date Noted  . Renal failure 03/26/2020  . Essential hypertension 06/08/2017  . Heartburn 06/08/2017  . Hypertensive emergency 05/12/2017  . Morbid obesity ([place]) 05/12/2017  . 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    Orientation RESPIRATION BLADDER Height & Weight     Self, Situation, Place, Time  Normal Continent, External catheter Weight: 268 lb 8.3 oz (121.8 kg) Height:  5\' 6"  (167.6 cm)  BEHAVIORAL SYMPTOMS/MOOD NEUROLOGICAL BOWEL NUTRITION STATUS      Continent Diet(Regular diet)  AMBULATORY STATUS COMMUNICATION OF NEEDS Skin   Limited Assist Verbally                         Personal Care Assistance Level of Assistance  Bathing, Feeding, Dressing Bathing Assistance: Maximum assistance Feeding assistance: Limited assistance Dressing Assistance: Maximum assistance     Functional Limitations Info             SPECIAL CARE FACTORS FREQUENCY  PT (By licensed PT), OT (By licensed OT)     PT Frequency: 5 days/week OT Frequency: 5 days/week            Contractures Contractures Info: Not present    Additional Factors Info  Code Status, Allergies, Isolation Precautions Code Status Info: Full Code Allergies Info:  Augmentin; Lisinopril     Isolation Precautions Info: Airborne Precautions- COVID 19     Current Medications (04/06/2020):  This is the current hospital active medication list Current Facility-Administered Medications  Medication Dose Route Frequency Provider Last Rate Last Admin  . 0.9 %  sodium chloride infusion (Manually program via Guardrails IV Fluids)   Intravenous Once Bradly Bienenstock, NP   Stopped at 03/27/20 1632  . 0.9 %  sodium chloride infusion  250 mL Intravenous PRN Flora Lipps, MD      . acetaminophen (TYLENOL) tablet 650 mg  650 mg Oral Q4H PRN Flora Lipps, MD   650 mg at 04/02/20 2133  . aerochamber plus with mask device 1 each  1 each Other Once Flora Lipps, MD   Stopped at 03/26/20 2214  . albuterol (VENTOLIN HFA) 108 (90 Base) MCG/ACT inhaler 1 puff  1 puff Inhalation Q4H Flora Lipps, MD   1 puff at 04/06/20 1208  . amLODipine (NORVASC) tablet 5 mg  5 mg Oral Daily Bhutani, Manpreet S, MD   5 mg at 04/06/20 0953  . B-complex with vitamin C tablet 1 tablet  1 tablet Oral Daily Flora Lipps, MD   1 tablet at 04/06/20 0953  . buprenorphine-naloxone (SUBOXONE) 8-2 mg per SL tablet 1 tablet  1 tablet Sublingual Daily Flora Lipps, MD   1 tablet at 04/06/20 0953  . Chlorhexidine Gluconate Cloth 2 % PADS 6 each  6  each Topical Daily Erin Fulling, MD   6 each at 04/06/20 [phone]  . ciclesonide (ALVESCO) 160 MCG/ACT inhaler 4 puff  4 puff Inhalation BID Erin Fulling, MD   4 puff at 04/06/20 0955  . dexamethasone (DECADRON) injection 4 mg  4 mg Intravenous Q24H Salena Saner, MD   4 mg at 04/06/20 0955  . docusate sodium (COLACE) capsule 100 mg  100 mg Oral BID PRN Erin Fulling, MD   100 mg at 04/05/20 0745  . feeding supplement (ENSURE ENLIVE) (ENSURE ENLIVE) liquid 237 mL  237 mL Oral TID BM Erin Fulling, MD   237 mL at 04/06/20 0955  . ipratropium (ATROVENT HFA) inhaler 2 puff  2 puff Inhalation Q4H Erin Fulling, MD   2 puff at 04/06/20 1209  . labetalol (NORMODYNE)  injection 10 mg  10 mg Intravenous Q2H PRN Harlon Ditty D, NP   10 mg at 04/06/20 0139  . labetalol (NORMODYNE) tablet 300 mg  300 mg Oral BID Kolluru, Sarath, MD   300 mg at 04/06/20 0953  . lip balm (BLISTEX) ointment   Topical PRN Judithe Modest, NP      . ondansetron [facility]) injection 4 mg  4 mg Intravenous Q6H PRN Erin Fulling, MD   4 mg at 04/02/20 0909  . polyethylene glycol (MIRALAX / GLYCOLAX) packet 17 g  17 g Oral Daily Cherly Hensen, RPH   17 g at 04/06/20 0955  . senna-docusate (Senokot-S) tablet 2 tablet  2 tablet Oral BID Cherly Hensen, [facility]   2 tablet at 04/06/20 2426  . sildenafil (REVATIO) tablet 20 mg  20 mg Oral TID Salena Saner, MD   20 mg at 04/06/20 0954  . sodium chloride flush (NS) 0.9 % injection 10-40 mL  10-40 mL Intracatheter Q12H Erin Fulling, MD   10 mL at 04/06/20 0956  . sodium chloride flush (NS) 0.9 % injection 10-40 mL  10-40 mL Intracatheter PRN Erin Fulling, MD      . sodium chloride flush (NS) 0.9 % injection 3 mL  3 mL Intravenous Q12H Erin Fulling, MD   3 mL at 04/06/20 0956  . sodium chloride flush (NS) 0.9 % injection 3 mL  3 mL Intravenous PRN Erin Fulling, MD      . thiamine 500mg  in normal saline (64ml) IVPB  500 mg Intravenous Q24H 45m, MD   Stopped at 04/05/20 1233     Discharge Medications: Please see discharge summary for a list of discharge medications.  Relevant Imaging Results:  Relevant Lab Results:   Additional Information SS #: [id number]  Oneal [place]low E Raymond Bhardwaj, LCSW

## 2020-04-07 ENCOUNTER — Inpatient Hospital Stay: Payer: Medicaid Other

## 2020-04-07 DIAGNOSIS — D500 Iron deficiency anemia secondary to blood loss (chronic): Secondary | ICD-10-CM | POA: Diagnosis present

## 2020-04-07 DIAGNOSIS — J9601 Acute respiratory failure with hypoxia: Secondary | ICD-10-CM | POA: Diagnosis present

## 2020-04-07 DIAGNOSIS — J1282 Pneumonia due to coronavirus disease 2019: Secondary | ICD-10-CM

## 2020-04-07 DIAGNOSIS — U071 COVID-19: Secondary | ICD-10-CM

## 2020-04-07 HISTORY — DX: COVID-19: U07.1

## 2020-04-07 LAB — CBC WITH DIFFERENTIAL/PLATELET
Abs Immature Granulocytes: 0.29 10*3/uL — ABNORMAL HIGH (ref 0.00–0.07)
Basophils Absolute: 0 10*3/uL (ref 0.0–0.1)
Basophils Relative: 0 %
Eosinophils Absolute: 0 10*3/uL (ref 0.0–0.5)
Eosinophils Relative: 0 %
HCT: 25.6 % — ABNORMAL LOW (ref 36.0–46.0)
Hemoglobin: 7.6 g/dL — ABNORMAL LOW (ref 12.0–15.0)
Immature Granulocytes: 3 %
Lymphocytes Relative: 11 %
Lymphs Abs: 1.3 10*3/uL (ref 0.7–4.0)
MCH: 22.3 pg — ABNORMAL LOW (ref 26.0–34.0)
MCHC: 29.7 g/dL — ABNORMAL LOW (ref 30.0–36.0)
MCV: 75.1 fL — ABNORMAL LOW (ref 80.0–100.0)
Monocytes Absolute: 1 10*3/uL (ref 0.1–1.0)
Monocytes Relative: 9 %
Neutro Abs: 9 10*3/uL — ABNORMAL HIGH (ref 1.7–7.7)
Neutrophils Relative %: 77 %
Platelets: 278 10*3/uL (ref 150–400)
RBC: 3.41 MIL/uL — ABNORMAL LOW (ref 3.87–5.11)
RDW: 22.7 % — ABNORMAL HIGH (ref 11.5–15.5)
Smear Review: NORMAL
WBC: 11.6 10*3/uL — ABNORMAL HIGH (ref 4.0–10.5)
nRBC: 0.3 % — ABNORMAL HIGH (ref 0.0–0.2)

## 2020-04-07 LAB — BASIC METABOLIC PANEL
Anion gap: 6 (ref 5–15)
BUN: 14 mg/dL (ref 6–20)
CO2: 27 mmol/L (ref 22–32)
Calcium: 8.1 mg/dL — ABNORMAL LOW (ref 8.9–10.3)
Chloride: 109 mmol/L (ref 98–111)
Creatinine, Ser: 1.1 mg/dL — ABNORMAL HIGH (ref 0.44–1.00)
GFR calc Af Amer: >60 mL/min (ref >60)
GFR calc non Af Amer: >60 mL/min (ref >60)
Glucose, Bld: 82 mg/dL (ref 70–99)
Potassium: 3.5 mmol/L (ref 3.5–5.1)
Sodium: 142 mmol/L (ref 135–145)

## 2020-04-07 LAB — BRAIN NATRIURETIC PEPTIDE: B Natriuretic Peptide: 98.8 pg/mL (ref 0.0–100.0)

## 2020-04-07 LAB — MAGNESIUM: Magnesium: 1.5 mg/dL — ABNORMAL LOW (ref 1.7–2.4)

## 2020-04-07 LAB — PHOSPHORUS: Phosphorus: 3.1 mg/dL (ref 2.5–4.6)

## 2020-04-07 MED ORDER — PANTOPRAZOLE SODIUM 40 MG PO TBEC
40.0000 mg | DELAYED_RELEASE_TABLET | Freq: Every day | ORAL | Status: DC
  Administered 2020-04-07 – 2020-04-12 (×6): 40 mg via ORAL
  Filled 2020-04-07 (×6): qty 1

## 2020-04-07 MED ORDER — ALUM & MAG HYDROXIDE-SIMETH 200-200-20 MG/5ML PO SUSP
30.0000 mL | ORAL | Status: DC | PRN
  Administered 2020-04-07 – 2020-04-11 (×3): 30 mL via ORAL
  Filled 2020-04-07 (×3): qty 30

## 2020-04-07 MED ORDER — MAGNESIUM SULFATE 2 GM/50ML IV SOLN
2.0000 g | Freq: Once | INTRAVENOUS | Status: AC
  Administered 2020-04-07: 2 g via INTRAVENOUS
  Filled 2020-04-07: qty 50

## 2020-04-07 MED ORDER — POTASSIUM CHLORIDE 20 MEQ PO PACK
40.0000 meq | PACK | Freq: Once | ORAL | Status: AC
  Administered 2020-04-07: 40 meq via ORAL
  Filled 2020-04-07: qty 2

## 2020-04-07 NOTE — Progress Notes (Addendum)
PROGRESS NOTE    Diane Keller  SRP:[id number] DOB: 31-May-1976 DOA: 03/26/2020 PCP: Birdie Sons, MD   Chief complaint.  Shortness of breath and hypoxia.   Brief Narrative:  Patient is a 44 year old female with history of acid reflex and history of stroke with residual left-sided weakness who present to the emergency room on 5/13 with generalized weakness and a fever.  She was hypotensive upon arrival.  She was diagnosed with severe COVID-19  Pneumonia, with secondary bacterial pneumonia, acute kidney injury and hypovolemic shock, and acute hypoxemic respiratory failure.  Patient has completed  remdesivir.  Patient never required hemodialysis.  Did not require intubation.  Patient was transferred to stepdown unit from ICU on 5/24.     Assessment & Plan:   Active Problems:   Renal failure  #1.  Acute hypoxemic respiratory failure secondary to COVID-19 pneumonia. Patient had a worsening hypoxemia last night, requiring 15 L oxygen.  Currently on 10 L oxygen.  Recheck her BNP was normal. Echocardiogram showed a mild pulmonary hypertension with normal ejection fraction.  Patient had a completed a course of antibiotics for bacterial infection.  I will recheck a procalcitonin level.  Reviewed the chest x-ray images performed on 5/19, there are multifocal patchy infiltrates.  Recheck a chest x-ray. Continue albuterol as scheduled.  Continue dexamethasone.   2.  Severe iron deficiency anemia. Required PRBC transfusion, also received IV iron. No evidence of active bleeding. Continue to monitor CBC.  3.  Acute kidney injury with ATN.  Secondary to Covid infection. Renal function had improved.  4.  Chronic diastolic congestive heart failure with mild pulmonary hypertension. BNP is normal, patient does not have volume overload.  5.  Morbid obesity.  6.  E. coli UTI. Antibiotics completed.  Addendum: 1700.  Patient had agitation and some confusion today. Added seroquel for  tonight. CT head did not show any acute change.   DVT prophylaxis: SCDs Code Status: Full Family Communication: None Disposition Plan:   Patient came from: Home                                                                                                               Anticipated d/c place: Home  Barriers to d/c OR conditions which need to be met to effect a safe d/c:   Consultants:   Nephrology  Cardiology  Pulmonology  Procedures:None     Antimicrobials: None  Subjective: Patient had a worsening oxygen desaturation last night.  Was initially  placed on 15 L oxygen, now down to 10 L..  She still has short of breath with exertion, not seem to be worse than before.  Cough, largely nonproductive. Does not have any nausea vomiting or diarrhea.  No abdominal pain. No fever or chills. No dysuria hematuria.  Objective: Vitals:   04/07/20 0500 04/07/20 0600 04/07/20 0700 04/07/20 0800  BP:    124/82  Pulse: 86 86 88 89  Resp: (!) 27 (!) 27 (!) 25 19  Temp:    99.2 F (37.3 C)  TempSrc:    Oral  SpO2: 94% 92% 90% 90%  Weight:      Height:        Intake/Output Summary (Last 24 hours) at 04/07/2020 1224 Last data filed at 04/07/2020 0200 Gross per 24 hour  Intake 340.93 ml  Output 875 ml  Net -534.07 ml   Filed Weights   04/05/20 0500 04/06/20 0410 04/07/20 0221  Weight: 116.9 kg 121.8 kg 121 kg    Examination:  General exam: Appears calm and comfortable.  Morbidly obese Respiratory system: Decreased breathing sounds. Respiratory effort normal. Cardiovascular system: S1 & S2 heard, RRR. No JVD, murmurs, rubs, gallops or clicks. No pedal edema. Gastrointestinal system: Abdomen is nondistended, soft and nontender. No organomegaly or masses felt. Normal bowel sounds heard. Central nervous system: Alert and oriented. No focal neurological deficits. Extremities: Symmetric Skin: No rashes, lesions or ulcers Psychiatry: Judgement and insight appear normal.  Mood & affect appropriate.     Data Reviewed: I have personally reviewed following labs and imaging studies  CBC: Recent Labs  Lab 04/03/20 0422 04/03/20 0422 04/04/20 0510 04/05/20 0413 04/05/20 1533 04/06/20 0411 04/07/20 0408  WBC 19.1*  --  17.6* 16.7*  --  15.7* 11.6*  NEUTROABS  --   --  14.6* 14.0*  --  12.5* 9.0*  HGB 7.3*   < > 7.3* 6.9* 8.1* 8.0* 7.6*  HCT 24.3*   < > 24.4* 23.5* 26.9* 27.4* 25.6*  MCV 71.5*  --  70.9* 72.8*  --  74.9* 75.1*  PLT 449*  --  394 371  --  367 278   < > = values in this interval not displayed.   Basic Metabolic Panel: Recent Labs  Lab 04/03/20 0422 04/04/20 0510 04/05/20 0413 04/06/20 0411 04/07/20 0408  NA 143 141 140 141 142  K 3.8 3.6 4.1 3.5 3.5  CL 104 105 105 107 109  CO2 _0 GLUCOSE 111* 109* 123* 104* 82  BUN 24* _1 CREATININE 1.60* 1.22* 1.24* 0.94 1.10*  CALCIUM 7.9* 8.1* 8.3* 8.2* 8.1*  MG 2.0 1.7 2.0 1.9 1.5*  PHOS 3.3 3.5 3.8 3.1 3.1   GFR: Estimated Creatinine Clearance: 84.7 mL/min (A) (by C-G formula based on SCr of 1.1 mg/dL (H)). Liver Function Tests: Recent Labs  Lab 04/01/20 0457 04/02/20 0332 04/03/20 0422  AST 40 34 30  ALT _2 ALKPHOS 62 64 71  BILITOT 0.5 0.4 0.6  PROT 6.6 6.2* 6.2*  ALBUMIN 2.2* 2.1* 2.0*   No results for input(s): LIPASE, AMYLASE in the last 168 hours. No results for input(s): AMMONIA in the last 168 hours. Coagulation Profile: No results for input(s): INR, PROTIME in the last 168 hours. Cardiac Enzymes: No results for input(s): CKTOTAL, CKMB, CKMBINDEX, TROPONINI in the last 168 hours. BNP (last 3 results) No results for input(s): PROBNP in the last 8760 hours. HbA1C: No results for input(s): HGBA1C in the last 72 hours. CBG: No results for input(s): GLUCAP in the last 168 hours. Lipid Profile: No results for input(s): CHOL, HDL, LDLCALC, TRIG, CHOLHDL, LDLDIRECT in the last 72 hours. Thyroid Function Tests: No results for input(s):  TSH, T4TOTAL, FREET4, T3FREE, THYROIDAB in the last 72 hours. Anemia Panel: Recent Labs    04/05/20 0413  FERRITIN 74   Sepsis Labs: Recent Labs  Lab 04/01/20 0457  PROCALCITON 2.26    No results found for this or any previous visit (from the past 240 hour(s)).  Radiology Studies: No results found.      Scheduled Meds: . sodium chloride   Intravenous Once  . aerochamber plus with mask  1 each Other Once  . albuterol  1 puff Inhalation Q4H  . amLODipine  5 mg Oral Daily  . B-complex with vitamin C  1 tablet Oral Daily  . buprenorphine-naloxone  1 tablet Sublingual Daily  . Chlorhexidine Gluconate Cloth  6 each Topical Daily  . ciclesonide  4 puff Inhalation BID  . dexamethasone (DECADRON) injection  4 mg Intravenous Q24H  . feeding supplement (ENSURE ENLIVE)  237 mL Oral TID BM  . ipratropium  2 puff Inhalation Q4H  . labetalol  300 mg Oral BID  . pantoprazole  40 mg Oral Daily  . polyethylene glycol  17 g Oral Daily  . senna-docusate  2 tablet Oral BID  . sildenafil  20 mg Oral TID  . sodium chloride flush  10-40 mL Intracatheter Q12H  . sodium chloride flush  3 mL Intravenous Q12H   Continuous Infusions: . sodium chloride    . thiamine injection Stopped (04/06/20 1618)     LOS: 12 days    Time spent: 34 minutes    Sharen Hones, MD [facility]   To contact the attending provider between 7A-7P or the covering provider during after hours 7P-7A, please log into the web site [email or web address] and access using universal [place] password for that web site. If you do not have the password, please call the hospital operator.  04/07/2020, 12:24 PM

## 2020-04-07 NOTE — Progress Notes (Signed)
Assisted tele visit to patient with husband.  Deaundra Dupriest D Lynae Pederson, RN  

## 2020-04-07 NOTE — Progress Notes (Signed)
Patient is on COVID isolation. Demanding to have two people bathe her. NT not available to assist RN. At 0100, RN had window of opportuity to get bath done as patient requested. Patient able to roll self in the bed. RN bathed patient by self. Patient agitated about being woken up for bath. Earlier in shift, RN made patient aware that the bath would get done during night shift as she is a night bath.

## 2020-04-07 NOTE — Progress Notes (Signed)
Assisted tele visit to patient with husband.  Shell Yandow P, RN  

## 2020-04-07 NOTE — Progress Notes (Signed)
Pharmacy Electrolyte Monitoring Consult:  Pharmacy consulted to assist in monitoring and replacing electrolytes in this 44 y.o. female admitted on 03/26/2020 with COVID-19 infection and AKI. Patient is at risk for refeeding syndrome.   Labs:  Sodium (mmol/L)  Date Value  04/07/2020 142  05/11/2012 140   Potassium (mmol/L)  Date Value  04/07/2020 3.5  05/11/2012 3.2 (L)   Magnesium (mg/dL)  Date Value  59/08/2889 1.5 (L)   Phosphorus (mg/dL)  Date Value  22/84/0698 3.1   Calcium (mg/dL)  Date Value  61/48/3073 8.1 (L)   Calcium, Total (mg/dL)  Date Value  54/30/1484 9.2   Albumin (g/dL)  Date Value  03/97/9536 2.0 (L)  05/11/2012 3.7   Corrected Ca: 9.7 mg/dL  Assessment/Plan:  Electrolytes:  Goal for electrolytes WNL   Repeat 40 mEq oral KCl x 1  2 grams IV magnesium sulfate x 1  continue to monitor with 5/26 AM labs and replace as indicated   Glucose: Poor nutritional intake  Day # 4 IV steroids: CBG controlled  Will continue to monitor glucose trend.  Constipation:   LBM 5/21  Currently on miralax and senokot-S scheduled plus prn docusate  Will continue to monitor   Lowella Bandy, PharmD  04/07/2020 7:08 AM

## 2020-04-07 NOTE — Progress Notes (Signed)
Physical Therapy Treatment Patient Details Name: Diane Keller MRN: [id number] DOB: 10/19/1976 Today's Date: 04/07/2020    History of Present Illness Per MD notes: Pt is a 44 y.o. female with a history of GERD and prior stroke with residual left-sided motor deficit who is brought to the ED due to fever and generalized weakness.  MD assessment includes: COVID-19 pneumonia, AMS/confusion, morbid obesity, and AKI/renal failure.    PT Comments    Pt with late lunch this date stating "I haven't eaten all day!".  Pt was willing to participate in supine therapeutic exercises this date per below and put forth very good effort doing so.  Pt also demonstrated grossly improved ability to follow commands.  Pt's SpO2 remained in the low 90s at rest and 87-88% during therex with cues given for breathing techniques during exercise.  Pt will benefit from PT services in a SNF setting upon discharge to safely address deficits listed in patient problem list for decreased caregiver assistance and eventual return to PLOF.   Follow Up Recommendations  SNF     Equipment Recommendations  Rolling walker with 5" wheels    Recommendations for Other Services       Precautions / Restrictions Precautions Precautions: Fall Restrictions Weight Bearing Restrictions: No    Mobility  Bed Mobility               General bed mobility comments: NT this session secondary to pt desire to eat lunch  Transfers                    Ambulation/Gait                 Stairs             Wheelchair Mobility    Modified Rankin (Stroke Patients Only)       Balance                                            Cognition Arousal/Alertness: Awake/alert Behavior During Therapy: WFL for tasks assessed/performed Overall Cognitive Status: No family/caregiver present to determine baseline cognitive functioning                                        Exercises  Total Joint Exercises Ankle Circles/Pumps: Strengthening;Both;10 reps;15 reps(with manual resistance.) Quad Sets: Strengthening;Both;10 reps Gluteal Sets: Strengthening;Both;10 reps Towel Squeeze: Strengthening;Both;10 reps Short Arc Quad: Strengthening;Both;10 reps(with manual resistance.) Heel Slides: Both;10 reps;Strengthening Hip ABduction/ADduction: Both;10 reps;Strengthening(with manual resistance.) Other Exercises Other Exercises: BLE supine leg press x 10 with manual resistance.    General Comments        Pertinent Vitals/Pain Pain Assessment: No/denies pain    Home Living                      Prior Function            PT Goals (current goals can now be found in the care plan section) Progress towards PT goals: Progressing toward goals    Frequency    Min 2X/week      PT Plan Current plan remains appropriate    Co-evaluation              AM-PAC PT "6 Clicks" Mobility   Outcome Measure  Help needed turning from your back to your side while in a flat bed without using bedrails?: A Lot Help needed moving from lying on your back to sitting on the side of a flat bed without using bedrails?: A Lot Help needed moving to and from a bed to a chair (including a wheelchair)?: A Lot Help needed standing up from a chair using your arms (e.g., wheelchair or bedside chair)?: A Lot Help needed to walk in hospital room?: Total Help needed climbing 3-5 steps with a railing? : Total 6 Click Score: 10    End of Session Equipment Utilized During Treatment: Oxygen Activity Tolerance: Patient tolerated treatment well Patient left: in bed;with call bell/phone within reach;Other (comment) Nurse Communication: Mobility status PT Visit Diagnosis: Muscle weakness (generalized) (M62.81);Difficulty in walking, not elsewhere classified (R26.2)     Time: 4299-8069 PT Time Calculation (min) (ACUTE ONLY): 24 min  Charges:  $Therapeutic Exercise: 23-37 mins                      D. Scott Larissa Pegg PT, DPT 04/07/20, 3:58 PM

## 2020-04-08 LAB — CBC WITH DIFFERENTIAL/PLATELET
Abs Immature Granulocytes: 0.27 10*3/uL — ABNORMAL HIGH (ref 0.00–0.07)
Basophils Absolute: 0 10*3/uL (ref 0.0–0.1)
Basophils Relative: 0 %
Eosinophils Absolute: 0.1 10*3/uL (ref 0.0–0.5)
Eosinophils Relative: 1 %
HCT: 24.1 % — ABNORMAL LOW (ref 36.0–46.0)
Hemoglobin: 7.2 g/dL — ABNORMAL LOW (ref 12.0–15.0)
Immature Granulocytes: 2 %
Lymphocytes Relative: 11 %
Lymphs Abs: 1.3 10*3/uL (ref 0.7–4.0)
MCH: 22.5 pg — ABNORMAL LOW (ref 26.0–34.0)
MCHC: 29.9 g/dL — ABNORMAL LOW (ref 30.0–36.0)
MCV: 75.3 fL — ABNORMAL LOW (ref 80.0–100.0)
Monocytes Absolute: 0.9 10*3/uL (ref 0.1–1.0)
Monocytes Relative: 7 %
Neutro Abs: 9.9 10*3/uL — ABNORMAL HIGH (ref 1.7–7.7)
Neutrophils Relative %: 79 %
Platelets: 253 10*3/uL (ref 150–400)
RBC: 3.2 MIL/uL — ABNORMAL LOW (ref 3.87–5.11)
RDW: 23.4 % — ABNORMAL HIGH (ref 11.5–15.5)
Smear Review: NORMAL
WBC: 12.5 10*3/uL — ABNORMAL HIGH (ref 4.0–10.5)
nRBC: 0.2 % (ref 0.0–0.2)

## 2020-04-08 LAB — BASIC METABOLIC PANEL
Anion gap: 8 (ref 5–15)
BUN: 11 mg/dL (ref 6–20)
CO2: 25 mmol/L (ref 22–32)
Calcium: 8.2 mg/dL — ABNORMAL LOW (ref 8.9–10.3)
Chloride: 108 mmol/L (ref 98–111)
Creatinine, Ser: 0.9 mg/dL (ref 0.44–1.00)
GFR calc Af Amer: >60 mL/min (ref >60)
GFR calc non Af Amer: >60 mL/min (ref >60)
Glucose, Bld: 92 mg/dL (ref 70–99)
Potassium: 3.4 mmol/L — ABNORMAL LOW (ref 3.5–5.1)
Sodium: 141 mmol/L (ref 135–145)

## 2020-04-08 LAB — MAGNESIUM: Magnesium: 1.6 mg/dL — ABNORMAL LOW (ref 1.7–2.4)

## 2020-04-08 LAB — PROCALCITONIN: Procalcitonin: 0.32 ng/mL

## 2020-04-08 LAB — PHOSPHORUS: Phosphorus: 3.8 mg/dL (ref 2.5–4.6)

## 2020-04-08 MED ORDER — BISACODYL 5 MG PO TBEC
5.0000 mg | DELAYED_RELEASE_TABLET | Freq: Once | ORAL | Status: AC
  Administered 2020-04-08: 5 mg via ORAL
  Filled 2020-04-08: qty 1

## 2020-04-08 MED ORDER — MAGNESIUM SULFATE 2 GM/50ML IV SOLN
2.0000 g | Freq: Once | INTRAVENOUS | Status: AC
  Administered 2020-04-08: 2 g via INTRAVENOUS
  Filled 2020-04-08: qty 50

## 2020-04-08 MED ORDER — MELATONIN 5 MG PO TABS
5.0000 mg | ORAL_TABLET | Freq: Every day | ORAL | Status: DC
  Administered 2020-04-08 – 2020-04-11 (×5): 5 mg via ORAL
  Filled 2020-04-08 (×6): qty 1

## 2020-04-08 MED ORDER — POTASSIUM CHLORIDE 20 MEQ PO PACK
40.0000 meq | PACK | Freq: Two times a day (BID) | ORAL | Status: AC
  Administered 2020-04-08 (×2): 40 meq via ORAL
  Filled 2020-04-08 (×2): qty 2

## 2020-04-08 NOTE — Progress Notes (Signed)
PROGRESS NOTE    NILE DORNING  GUY:[id number] DOB: 1976-04-23 DOA: 03/26/2020 PCP: Malva Limes, MD    Brief Narrative:  Patient is a 44 year old female with history of acid reflex and history of stroke with residual left-sided weakness who present to the emergency room on 5/13 with generalized weakness and a fever. She was hypotensive upon arrival. She was diagnosed with severe COVID-19 Pneumonia, with secondary bacterial pneumonia, acute kidney injury and hypovolemic shock,and acute hypoxemic respiratory failure. Patient has completed  remdesivir. Patient never required hemodialysis. Did not require intubation.  Patient was transferred to stepdown unit from ICU on 5/24.    Consultants:   Nephrology, PCCM, cardiology  Procedures:  Antimicrobials:   None   Subjective: Feeling a little better.  Still with short of breath no other complaints Objective: Vitals:   04/08/20 1000 04/08/20 1100 04/08/20 1200 04/08/20 1300  BP: (!) 160/96 139/83 130/77 132/81  Pulse: (!) 29  78   Resp: (!) 26 (!) 25 (!) 28 (!) 32  Temp:   98 F (36.7 C)   TempSrc:      SpO2: 92%  93%   Weight:      Height:        Intake/Output Summary (Last 24 hours) at 04/08/2020 1622 Last data filed at 04/08/2020 1300 Gross per 24 hour  Intake 623.93 ml  Output 2550 ml  Net -1926.07 ml   Filed Weights   04/05/20 0500 04/06/20 0410 04/07/20 0221  Weight: 116.9 kg 121.8 kg 121 kg    Examination:  General exam: Appears calm and comfortable  Respiratory system: Clear to auscultation.  No wheezing or rhonchi's Cardiovascular system: S1 & S2 heard, RRR. No JVD, murmurs, rubs, gallops or clicks.  Gastrointestinal system: Abdomen is nondistended, soft and nontender.. Normal bowel sounds heard. Central nervous system: Alert and oriented.  Grossly intact Extremities: No edema Skin: Warm dry Psychiatry: . Mood & affect appropriate in current setting.     Data Reviewed: I have personally  reviewed following labs and imaging studies  CBC: Recent Labs  Lab 04/04/20 0510 04/04/20 0510 04/05/20 0413 04/05/20 1533 04/06/20 0411 04/07/20 0408 04/08/20 0534  WBC 17.6*  --  16.7*  --  15.7* 11.6* 12.5*  NEUTROABS 14.6*  --  14.0*  --  12.5* 9.0* 9.9*  HGB 7.3*   < > 6.9* 8.1* 8.0* 7.6* 7.2*  HCT 24.4*   < > 23.5* 26.9* 27.4* 25.6* 24.1*  MCV 70.9*  --  72.8*  --  74.9* 75.1* 75.3*  PLT 394  --  371  --  367 278 253   < > = values in this interval not displayed.   Basic Metabolic Panel: Recent Labs  Lab 04/04/20 0510 04/05/20 0413 04/06/20 0411 04/07/20 0408 04/08/20 0534  NA 141 140 141 142 141  K 3.6 4.1 3.5 3.5 3.4*  CL 105 105 107 109 108  CO2 30 26 27 27 25   GLUCOSE 109* 123* 104* 82 92  BUN 20 19 19 14 11   CREATININE 1.22* 1.24* 0.94 1.10* 0.90  CALCIUM 8.1* 8.3* 8.2* 8.1* 8.2*  MG 1.7 2.0 1.9 1.5* 1.6*  PHOS 3.5 3.8 3.1 3.1 3.8   GFR: Estimated Creatinine Clearance: 103.6 mL/min (by C-G formula based on SCr of 0.9 mg/dL). Liver Function Tests: Recent Labs  Lab 04/02/20 0332 04/03/20 0422  AST 34 30  ALT 16 11  ALKPHOS 64 71  BILITOT 0.4 0.6  PROT 6.2* 6.2*  ALBUMIN 2.1* 2.0*  No results for input(s): LIPASE, AMYLASE in the last 168 hours. No results for input(s): AMMONIA in the last 168 hours. Coagulation Profile: No results for input(s): INR, PROTIME in the last 168 hours. Cardiac Enzymes: No results for input(s): CKTOTAL, CKMB, CKMBINDEX, TROPONINI in the last 168 hours. BNP (last 3 results) No results for input(s): PROBNP in the last 8760 hours. HbA1C: No results for input(s): HGBA1C in the last 72 hours. CBG: No results for input(s): GLUCAP in the last 168 hours. Lipid Profile: No results for input(s): CHOL, HDL, LDLCALC, TRIG, CHOLHDL, LDLDIRECT in the last 72 hours. Thyroid Function Tests: No results for input(s): TSH, T4TOTAL, FREET4, T3FREE, THYROIDAB in the last 72 hours. Anemia Panel: No results for input(s): VITAMINB12,  FOLATE, FERRITIN, TIBC, IRON, RETICCTPCT in the last 72 hours. Sepsis Labs: Recent Labs  Lab 04/08/20 0534  PROCALCITON 0.32    No results found for this or any previous visit (from the past 240 hour(s)).       Radiology Studies: CT HEAD WO CONTRAST  Result Date: 04/07/2020 CLINICAL DATA:  Altered mental status, COVID-19 pneumonia EXAM: CT HEAD WITHOUT CONTRAST TECHNIQUE: Contiguous axial images were obtained from the base of the skull through the vertex without intravenous contrast. COMPARISON:  04/02/2020 FINDINGS: Brain: There is no acute intracranial hemorrhage, mass effect, or edema. There is no new loss of gray-white differentiation. Small area of encephalomalacia is again identified in the region the right lentiform nucleus. There is no extra-axial fluid collection. Ventricles and sulci are within normal limits in size and configuration. Vascular: No hyperdense vessel or unexpected calcification. Skull: Calvarium is unremarkable. Sinuses/Orbits: Nonspecific partial ethmoid and sphenoid sinus opacification layering secretions. Visualized orbits are unremarkable. Other: Mastoid air cells are clear. IMPRESSION: No acute intracranial abnormality. No significant change since 04/02/2020. Electronically Signed   By: Macy Mis M.D.   On: 04/07/2020 16:19   DG Chest Port 1 View  Result Date: 04/07/2020 CLINICAL DATA:  Hypoxia.  COVID-19 positive EXAM: PORTABLE CHEST 1 VIEW COMPARISON:  Apr 01, 2020 FINDINGS: Right peripherally inserted central catheter crosses from the right side with tip in left innominate vein. No pneumothorax. There is airspace opacity throughout much of the right upper lobe as well as in the left upper lobe and left base regions. A lesser degree of opacity is noted in the right base region. There is no appreciable adenopathy. Heart is upper normal in size with pulmonary vascularity within normal limits. No bone lesions. IMPRESSION: Multifocal airspace opacity,  significantly more than on most recent study consistent with multifocal pneumonia. Right peripherally inserted central catheter has its tip in the left innominate vein. No pneumothorax. Electronically Signed   By: Lowella Grip III M.D.   On: 04/07/2020 13:20        Scheduled Meds: . sodium chloride   Intravenous Once  . aerochamber plus with mask  1 each Other Once  . albuterol  1 puff Inhalation Q4H  . amLODipine  5 mg Oral Daily  . B-complex with vitamin C  1 tablet Oral Daily  . bisacodyl  5 mg Oral Once  . buprenorphine-naloxone  1 tablet Sublingual Daily  . Chlorhexidine Gluconate Cloth  6 each Topical Daily  . ciclesonide  4 puff Inhalation BID  . dexamethasone (DECADRON) injection  4 mg Intravenous Q24H  . feeding supplement (ENSURE ENLIVE)  237 mL Oral TID BM  . ipratropium  2 puff Inhalation Q4H  . labetalol  300 mg Oral BID  . melatonin  5  mg Oral QHS  . pantoprazole  40 mg Oral Daily  . polyethylene glycol  17 g Oral Daily  . potassium chloride  40 mEq Oral BID  . senna-docusate  2 tablet Oral BID  . sildenafil  20 mg Oral TID  . sodium chloride flush  10-40 mL Intracatheter Q12H  . sodium chloride flush  3 mL Intravenous Q12H   Continuous Infusions: . sodium chloride      Assessment & Plan:   Active Problems:   Renal failure   Acute hypoxemic respiratory failure (HCC)   Pneumonia due to COVID-19 virus   Iron deficiency anemia due to chronic blood loss  #1.  Acute hypoxemic respiratory failure secondary to COVID-19 pneumonia. Still requiring much o2, has been weaned down from 15L to 5L now.  contin  Echocardiogram showed a mild pulmonary hypertension with normal ejection fraction.  Patient had a completed a course of antibiotics for bacterial infection.  We will continue inhalers and steroids   2.  Severe iron deficiency anemia. Required PRBC transfusion, also received IV iron. No evidence of active bleeding. Continue to monitor CBC., will  transfuse if Hg <7  3.  Acute kidney injury with ATN.  Secondary to Covid infection. Renal function had improved.  4.  Chronic diastolic congestive heart failure with mild pulmonary hypertension. BNP is normal, patient does not have volume overload.  5.  Morbid obesity.  6.  E. coli UTI. Antibiotics completed.   DVT prophylaxis:SCDs Code Status:Full Family Communication:None Disposition Plan:SNF Barrier: Medically still not optimized, will need snf, case mx working on it. Need to wean down 02. Need to meet safe d/c        LOS: 13 days   Time spent:45 minutes with more than 50% on COC    Lynn Ito, MD [facility] Pager [phone]  If 7PM-7AM, please contact night-coverage [email or web address] Password [facility] 04/08/2020, 4:22 PM

## 2020-04-08 NOTE — Progress Notes (Signed)
Assisted tele visit to patient with husband.  Diane Keller D Idan Prime, RN  

## 2020-04-08 NOTE — Progress Notes (Addendum)
Pharmacy Electrolyte Monitoring Consult:  Pharmacy consulted to assist in monitoring and replacing electrolytes in this 44 y.o. female admitted on 03/26/2020 with COVID-19 infection and AKI. Patient is at risk for refeeding syndrome.   Labs:  Sodium (mmol/L)  Date Value  04/08/2020 141  05/11/2012 140   Potassium (mmol/L)  Date Value  04/08/2020 3.4 (L)  05/11/2012 3.2 (L)   Magnesium (mg/dL)  Date Value  15/80/6386 1.6 (L)   Phosphorus (mg/dL)  Date Value  85/48/8301 3.8   Calcium (mg/dL)  Date Value  41/59/7331 8.2 (L)   Calcium, Total (mg/dL)  Date Value  25/06/7198 9.2   Albumin (g/dL)  Date Value  41/29/0475 2.0 (L)  05/11/2012 3.7   Corrected Ca: 9.8 mg/dL  Assessment/Plan:  Electrolytes:  Goal for electrolytes WNL   40 mEq oral KCl x 2  Repeat 2 grams IV magnesium sulfate x 1  continue to monitor with 5/27 AM labs and replace as indicated   Glucose: Poor nutritional intake  Day # 5 IV steroids: CBG controlled  Will continue to monitor glucose trend.  Constipation:   LBM 5/21  Currently on miralax and senokot-S scheduled plus prn docusate  Bisacodyl po x 1 this evening  Will continue to monitor   Lowella Bandy, PharmD  04/08/2020 6:57 AM

## 2020-04-09 LAB — CBC WITH DIFFERENTIAL/PLATELET
Abs Immature Granulocytes: 0.25 10*3/uL — ABNORMAL HIGH (ref 0.00–0.07)
Basophils Absolute: 0.1 10*3/uL (ref 0.0–0.1)
Basophils Relative: 0 %
Eosinophils Absolute: 0.1 10*3/uL (ref 0.0–0.5)
Eosinophils Relative: 1 %
HCT: 26.5 % — ABNORMAL LOW (ref 36.0–46.0)
Hemoglobin: 7.9 g/dL — ABNORMAL LOW (ref 12.0–15.0)
Immature Granulocytes: 2 %
Lymphocytes Relative: 9 %
Lymphs Abs: 1.2 10*3/uL (ref 0.7–4.0)
MCH: 22.1 pg — ABNORMAL LOW (ref 26.0–34.0)
MCHC: 29.8 g/dL — ABNORMAL LOW (ref 30.0–36.0)
MCV: 74.2 fL — ABNORMAL LOW (ref 80.0–100.0)
Monocytes Absolute: 0.6 10*3/uL (ref 0.1–1.0)
Monocytes Relative: 4 %
Neutro Abs: 11.6 10*3/uL — ABNORMAL HIGH (ref 1.7–7.7)
Neutrophils Relative %: 84 %
Platelets: 239 10*3/uL (ref 150–400)
RBC: 3.57 MIL/uL — ABNORMAL LOW (ref 3.87–5.11)
RDW: 23.9 % — ABNORMAL HIGH (ref 11.5–15.5)
Smear Review: NORMAL
WBC: 13.7 10*3/uL — ABNORMAL HIGH (ref 4.0–10.5)
nRBC: 0 % (ref 0.0–0.2)

## 2020-04-09 LAB — BASIC METABOLIC PANEL
Anion gap: 7 (ref 5–15)
BUN: 10 mg/dL (ref 6–20)
CO2: 26 mmol/L (ref 22–32)
Calcium: 8.4 mg/dL — ABNORMAL LOW (ref 8.9–10.3)
Chloride: 108 mmol/L (ref 98–111)
Creatinine, Ser: 0.92 mg/dL (ref 0.44–1.00)
GFR calc Af Amer: >60 mL/min (ref >60)
GFR calc non Af Amer: >60 mL/min (ref >60)
Glucose, Bld: 104 mg/dL — ABNORMAL HIGH (ref 70–99)
Potassium: 3.8 mmol/L (ref 3.5–5.1)
Sodium: 141 mmol/L (ref 135–145)

## 2020-04-09 LAB — PHOSPHORUS: Phosphorus: 3.6 mg/dL (ref 2.5–4.6)

## 2020-04-09 LAB — MAGNESIUM: Magnesium: 1.7 mg/dL (ref 1.7–2.4)

## 2020-04-09 MED ORDER — MAGNESIUM SULFATE 2 GM/50ML IV SOLN
2.0000 g | Freq: Once | INTRAVENOUS | Status: AC
  Administered 2020-04-09: 2 g via INTRAVENOUS
  Filled 2020-04-09: qty 50

## 2020-04-09 MED ORDER — ALTEPLASE 2 MG IJ SOLR
2.0000 mg | Freq: Once | INTRAMUSCULAR | Status: AC
  Administered 2020-04-09: 2 mg
  Filled 2020-04-09: qty 2

## 2020-04-09 MED ORDER — TRAZODONE HCL 50 MG PO TABS
25.0000 mg | ORAL_TABLET | Freq: Every evening | ORAL | Status: DC | PRN
  Administered 2020-04-10 – 2020-04-15 (×4): 25 mg via ORAL
  Filled 2020-04-09 (×4): qty 1

## 2020-04-09 MED ORDER — MAGNESIUM SULFATE 2 GM/50ML IV SOLN
2.0000 g | Freq: Once | INTRAVENOUS | Status: AC
  Administered 2020-04-09: 21:00:00 2 g via INTRAVENOUS
  Filled 2020-04-09: qty 50

## 2020-04-09 NOTE — Progress Notes (Signed)
Physical Therapy Treatment Patient Details Name: Diane Keller MRN: [id number] DOB: 07/29/1976 Today's Date: 04/09/2020    History of Present Illness Per MD notes: Pt is a 44 y.o. female with a history of GERD and prior stroke with residual left-sided motor deficit who is brought to the ED due to fever and generalized weakness.  MD assessment includes: COVID-19 pneumonia, AMS/confusion, morbid obesity, and AKI/renal failure.    PT Comments    Pt pleasant and motivated to participate during the session.  Pt only required the use of the bed rails and extra time and effort with bed mobility tasks.  Pt required the EOB to be elevated and min A to stand safely and to address instability in standing.  Pt was able to amb a max of 8' before requiring to return to sitting with SpO2 dropping from the low 90s to 86%.  SpO2 quickly increased back to the low 90s with PLB with no adverse symptoms other than fatigue noted by pt.  Pt will benefit from PT services in a SNF setting upon discharge to safely address deficits listed in patient problem list for decreased caregiver assistance and eventual return to PLOF.     Follow Up Recommendations  SNF     Equipment Recommendations  Rolling walker with 5" wheels    Recommendations for Other Services       Precautions / Restrictions Precautions Precautions: Fall Restrictions Weight Bearing Restrictions: No    Mobility  Bed Mobility Overal bed mobility: Modified Independent             General bed mobility comments: Extra time and effort required along with the use of the bed rails  Transfers Overall transfer level: Needs assistance Equipment used: Rolling walker (2 wheeled) Transfers: Sit to/from Stand Sit to Stand: Min assist;From elevated surface         General transfer comment: Mod verbal cues for hand placement and min A for stability standing from an elevated surface  Ambulation/Gait Ambulation/Gait assistance: Mod  assist Gait Distance (Feet): 8 Feet Assistive device: Rolling walker (2 wheeled) Gait Pattern/deviations: Step-through pattern;Trunk flexed Gait velocity: decreased   General Gait Details: Effortful and unsteady steps with posterior instability requiring mod A to prevent LOB   Stairs             Wheelchair Mobility    Modified Rankin (Stroke Patients Only)       Balance Overall balance assessment: Needs assistance   Sitting balance-Leahy Scale: Good     Standing balance support: Bilateral upper extremity supported;During functional activity Standing balance-Leahy Scale: Poor Standing balance comment: Mod A for stability during ambulation                            Cognition Arousal/Alertness: Awake/alert Behavior During Therapy: WFL for tasks assessed/performed Overall Cognitive Status: Within Functional Limits for tasks assessed                                        Exercises Total Joint Exercises Ankle Circles/Pumps: Strengthening;Both;10 reps;15 reps Quad Sets: Strengthening;Both;10 reps Gluteal Sets: Strengthening;Both;10 reps Short Arc Quad: Strengthening;Both;10 reps Heel Slides: Both;10 reps;Strengthening Hip ABduction/ADduction: Both;10 reps;Strengthening Marching in Standing: AROM;Strengthening;Both;5 reps;Standing Other Exercises: lateral scooting at EOB Other Exercises: PLB education    General Comments        Pertinent Vitals/Pain Pain Assessment: No/denies pain  Home Living Family/patient expects to be discharged to:: Private residence Living Arrangements: Spouse/significant other;Children Available Help at Discharge: Family(Husband and 20 y.o son.) Type of Home: House Home Access: Level entry   Home Layout: One level(1 step down into dining room) Home Equipment: None      Prior Function Level of Independence: Independent      Comments: Stays home with 46 y.o., 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 I for all ADL/IADL management PTA.   PT Goals (current goals can now be found in the care plan section) Progress towards PT goals: Progressing toward goals    Frequency    Min 2X/week      PT Plan Current plan remains appropriate    Co-evaluation              AM-PAC PT "6 Clicks" Mobility   Outcome Measure  Help needed turning from your back to your side while in a flat bed without using bedrails?: A Little Help needed moving from lying on your back to sitting on the side of a flat bed without using bedrails?: A Little Help needed moving to and from a bed to a chair (including a wheelchair)?: A Lot Help needed standing up from a chair using your arms (e.g., wheelchair or bedside chair)?: A Little Help needed to walk in hospital room?: A Lot Help needed climbing 3-5 steps with a railing? : Total 6 Click Score: 14    End of Session Equipment Utilized During Treatment: Oxygen;Gait belt Activity Tolerance: Patient tolerated treatment well Patient left: in bed;with call bell/phone within reach;with bed alarm set Nurse Communication: Mobility status;Other (comment)(SpO2 down to 86% with amb but back to low 90s upon sitting <30 sec) PT Visit Diagnosis: Muscle weakness (generalized) (M62.81);Difficulty in walking, not elsewhere classified (R26.2)     Time: 1550-1630 PT Time Calculation (min) (ACUTE ONLY): 40 min  Charges:  $Gait Training: 8-22 mins $Therapeutic Exercise: 8-22 mins $Therapeutic Activity: 8-22 mins                    D. Scott Ngina Royer PT, DPT 04/09/20, 4:47 PM

## 2020-04-09 NOTE — Progress Notes (Signed)
PROGRESS NOTE    Diane Keller  OHY:[id number] DOB: Jul 27, 1976 DOA: 03/26/2020 PCP: Malva Limes, MD    Brief Narrative:  Patient is a 44 year old female with history of acid reflex and history of stroke with residual left-sided weakness who present to the emergency room on 5/13 with generalized weakness and a fever. She was hypotensive upon arrival. She was diagnosed with severe COVID-19 Pneumonia, with secondary bacterial pneumonia, acute kidney injury and hypovolemic shock,and acute hypoxemic respiratory failure. Patient has completed  remdesivir. Patient never required hemodialysis. Did not require intubation.  Patient was transferred to stepdown unit from ICU on 5/24.    Consultants:   Nephrology, PCCM, cardiology  Procedures:  Antimicrobials:   None   Subjective: Reports feeling much better.  Liters of oxygen.  Still does become short of breath.  Per OT note ,pt spO2 noted to reach 86% during functional transfer with pt on 10L HFNC  Objective: Vitals:   04/09/20 1300 04/09/20 1400 04/09/20 1500 04/09/20 1801  BP: 139/89 111/70 (!) 164/94 (!) 149/92  Pulse: 80 81 78 84  Resp: (!) 31 (!) 29 (!) 24 (!) 22  Temp:   98.8 F (37.1 C) 97.8 F (36.6 C)  TempSrc:   Oral Oral  SpO2: 94% 96%    Weight:      Height:        Intake/Output Summary (Last 24 hours) at 04/09/2020 1900 Last data filed at 04/09/2020 0600 Gross per 24 hour  Intake 711 ml  Output 950 ml  Net -239 ml   Filed Weights   04/06/20 0410 04/07/20 0221 04/09/20 0500  Weight: 121.8 kg 121 kg 119.7 kg    Examination:  General exam: Appears calm and comfortable , nad Respiratory system: minimal rhonchi b/l bases, no wheezizng Cardiovascular system: S1 & S2 heard, RRR. No JVD, murmurs, rubs, gallops or clicks.  Gastrointestinal system: Abdomen is nondistended, soft and nontender.. Normal bowel sounds heard. Central nervous system: Alert and oriented x3.  Grossly intact Extremities: No  edema or cyanosos Skin: Warm dry Psychiatry: . Mood & affect appropriate in current setting.     Data Reviewed: I have personally reviewed following labs and imaging studies  CBC: Recent Labs  Lab 04/05/20 0413 04/05/20 0413 04/05/20 1533 04/06/20 0411 04/07/20 0408 04/08/20 0534 04/09/20 0708  WBC 16.7*  --   --  15.7* 11.6* 12.5* 13.7*  NEUTROABS 14.0*  --   --  12.5* 9.0* 9.9* 11.6*  HGB 6.9*   < > 8.1* 8.0* 7.6* 7.2* 7.9*  HCT 23.5*   < > 26.9* 27.4* 25.6* 24.1* 26.5*  MCV 72.8*  --   --  74.9* 75.1* 75.3* 74.2*  PLT 371  --   --  367 278 253 239   < > = values in this interval not displayed.   Basic Metabolic Panel: Recent Labs  Lab 04/05/20 0413 04/06/20 0411 04/07/20 0408 04/08/20 0534 04/09/20 0708  NA 140 141 142 141 141  K 4.1 3.5 3.5 3.4* 3.8  CL 105 107 109 108 108  CO2 26 27 27 25 26   GLUCOSE 123* 104* 82 92 104*  BUN 19 19 14 11 10   CREATININE 1.24* 0.94 1.10* 0.90 0.92  CALCIUM 8.3* 8.2* 8.1* 8.2* 8.4*  MG 2.0 1.9 1.5* 1.6* 1.7  PHOS 3.8 3.1 3.1 3.8 3.6   GFR: Estimated Creatinine Clearance: 100.7 mL/min (by C-G formula based on SCr of 0.92 mg/dL). Liver Function Tests: Recent Labs  Lab 04/03/20 0422  AST  30  ALT 11  ALKPHOS 71  BILITOT 0.6  PROT 6.2*  ALBUMIN 2.0*   No results for input(s): LIPASE, AMYLASE in the last 168 hours. No results for input(s): AMMONIA in the last 168 hours. Coagulation Profile: No results for input(s): INR, PROTIME in the last 168 hours. Cardiac Enzymes: No results for input(s): CKTOTAL, CKMB, CKMBINDEX, TROPONINI in the last 168 hours. BNP (last 3 results) No results for input(s): PROBNP in the last 8760 hours. HbA1C: No results for input(s): HGBA1C in the last 72 hours. CBG: No results for input(s): GLUCAP in the last 168 hours. Lipid Profile: No results for input(s): CHOL, HDL, LDLCALC, TRIG, CHOLHDL, LDLDIRECT in the last 72 hours. Thyroid Function Tests: No results for input(s): TSH, T4TOTAL,  FREET4, T3FREE, THYROIDAB in the last 72 hours. Anemia Panel: No results for input(s): VITAMINB12, FOLATE, FERRITIN, TIBC, IRON, RETICCTPCT in the last 72 hours. Sepsis Labs: Recent Labs  Lab 04/08/20 0534  PROCALCITON 0.32    No results found for this or any previous visit (from the past 240 hour(s)).       Radiology Studies: No results found.      Scheduled Meds: . sodium chloride   Intravenous Once  . aerochamber plus with mask  1 each Other Once  . albuterol  1 puff Inhalation Q4H  . amLODipine  5 mg Oral Daily  . B-complex with vitamin C  1 tablet Oral Daily  . buprenorphine-naloxone  1 tablet Sublingual Daily  . Chlorhexidine Gluconate Cloth  6 each Topical Daily  . ciclesonide  4 puff Inhalation BID  . dexamethasone (DECADRON) injection  4 mg Intravenous Q24H  . feeding supplement (ENSURE ENLIVE)  237 mL Oral TID BM  . ipratropium  2 puff Inhalation Q4H  . labetalol  300 mg Oral BID  . melatonin  5 mg Oral QHS  . pantoprazole  40 mg Oral Daily  . polyethylene glycol  17 g Oral Daily  . senna-docusate  2 tablet Oral BID  . sildenafil  20 mg Oral TID  . sodium chloride flush  10-40 mL Intracatheter Q12H  . sodium chloride flush  3 mL Intravenous Q12H   Continuous Infusions: . sodium chloride      Assessment & Plan:   Active Problems:   Renal failure   Acute hypoxemic respiratory failure (HCC)   Pneumonia due to COVID-19 virus   Iron deficiency anemia due to chronic blood loss  #1.  Acute hypoxemic respiratory failure secondary to COVID-19 pneumonia. Still requiring much o2, has been weaned down from 15L to 56L now.  Drops sats with ambulation and needs more 02l.   Echocardiogram showed a mild pulmonary hypertension with normal ejection fraction.  Patient had a completed a course of antibiotics for bacterial infection.  We will continue inhalers and steroids Have asked pulmonology to see pt for any further recommendation.   2.  Severe iron  deficiency anemia. Required PRBC transfusion, also received IV iron. No evidence of active bleeding. Continue to monitor CBC., will transfuse if Hg <7 Today Hg 7.9  3.  Acute kidney injury with ATN.  Secondary to Covid infection. Renal function has improved.  4.  Chronic diastolic congestive heart failure with mild pulmonary hypertension. BNP is normal, euvolemic on exam  5.  Morbid obesity.  6.  E. coli UTI. Antibiotics completed.   DVT prophylaxis:SCDs Code Status:Full Family Communication:None Disposition Plan:SNF Barrier: Medically still not optimized, will need snf, case mx working on it. Need to wean down  02. Need to meet safe d/c planning . Likely few more days as she still requires iv steroids        LOS: 14 days   Time spent:45 minutes with more than 50% on COC    Lynn Ito, MD [facility] Pager [phone]  If 7PM-7AM, please contact night-coverage [email or web address] Password [email or web address] 04/09/2020, 7:00 PM Patient ID: Diane Keller, female   DOB: June 10, 1976, 44 y.o.   MRN: [id number]

## 2020-04-09 NOTE — Progress Notes (Signed)
Pharmacy Electrolyte Monitoring Consult:  Pharmacy consulted to assist in monitoring and replacing electrolytes in this 44 y.o. female admitted on 03/26/2020 with COVID-19 infection and AKI. Patient is at risk for refeeding syndrome.   Labs:  Sodium (mmol/L)  Date Value  04/09/2020 141  05/11/2012 140   Potassium (mmol/L)  Date Value  04/09/2020 3.8  05/11/2012 3.2 (L)   Magnesium (mg/dL)  Date Value  09/79/4997 1.7   Phosphorus (mg/dL)  Date Value  18/20/9906 3.6   Calcium (mg/dL)  Date Value  89/34/0684 8.4 (L)   Calcium, Total (mg/dL)  Date Value  03/35/3317 9.2   Albumin (g/dL)  Date Value  40/99/2780 2.0 (L)  05/11/2012 3.7   Corrected Ca: 10.0 mg/dL  Assessment/Plan:  Electrolytes:  Goal for electrolytes WNL   Repeat 2 grams IV magnesium sulfate x 1  continue to monitor with 5/28 AM labs and replace as indicated   Glucose: Poor nutritional intake  Day # 6 IV steroids: CBG controlled  Will continue to monitor glucose trend.  Constipation:   LBM 5/27  Currently on miralax and senokot-S scheduled plus prn docusate  Will continue to monitor   Lowella Bandy, PharmD  04/09/2020 8:05 AM

## 2020-04-09 NOTE — TOC Progression Note (Signed)
Transition of Care [facility]) - Progression Note    Patient Details  Name: Diane Keller MRN: [id number] Date of Birth: 04-14-1976  [facility] CM/SW Contact  Liliana Cline, LCSW Phone Number: 04/09/2020, 2:23 PM  Clinical Narrative:   No SNF bed offers yet. Sent patient's information to other SNFs in surrounding areas.     Expected Discharge Plan: Skilled Nursing Facility Barriers to Discharge: Continued Medical Work up  Expected Discharge Plan and Services Expected Discharge Plan: Skilled Nursing Facility       Living arrangements for the past 2 months: Single Family Home                                       Social Determinants of Health (SDOH) Interventions    Readmission Risk Interventions No flowsheet data found.

## 2020-04-09 NOTE — Evaluation (Signed)
Occupational Therapy Evaluation Patient Details Name: Diane Keller MRN: [id number] DOB: 05/17/1976 Today's Date: 04/09/2020    History of Present Illness Per MD notes: Pt is a 44 y.o. female with a history of GERD and prior stroke with residual left-sided motor deficit who is brought to the ED due to fever and generalized weakness.  MD assessment includes: COVID-19 pneumonia, AMS/confusion, morbid obesity, and AKI/renal failure.   Clinical Impression   Diane Keller was seen for OT evaluation this date. Pt received on 10 L HFNC. Pt is generally A&O t/o session but requires occasional prompting/re-direction to task with activities. Pt reports that she was independent in all ADL and functional mobility PTA, living in a 1-level house with a level entry with her spouse and 71 y.o. son. Pt currently requires MIN A for bed and STS transfers as well as MOD A for LB ADL management due to current functional impairments (See OT Problem List below). O2 sats monitored t/o session. Pt spO2 noted to reach 86% during functional transfer with pt on 10L HFNC. RN notified/aware. Pt educated in energy conservation strategies including pursed lip breathing, activity pacing, home/routines modifications, work simplification, AE/DME, prioritizing of meaningful occupations, and falls prevention.  Pt verbalized understanding and would benefit from additional skilled OT services to maximize recall and carryover of learned techniques and facilitate implementation of learned techniques into daily routines. Upon discharge, recommend STR to maximize pt safety and return to PLOF.       Follow Up Recommendations  SNF    Equipment Recommendations  3 in 1 bedside commode    Recommendations for Other Services       Precautions / Restrictions Precautions Precautions: Fall Restrictions Weight Bearing Restrictions: No      Mobility Bed Mobility Overal bed mobility: Modified Independent Bed Mobility: Supine to Sit;Sit to  Supine     Supine to sit: Min assist Sit to supine: Min assist   General bed mobility comments: Extra time and effort required  Transfers Overall transfer level: Needs assistance Equipment used: Rolling walker (2 wheeled) Transfers: Sit to/from Stand Sit to Stand: Min assist;From elevated surface         General transfer comment: Mod verbal cues for hand placement and min A for stability standing from an elevated surface    Balance Overall balance assessment: Needs assistance Sitting-balance support: Feet supported;No upper extremity supported Sitting balance-Leahy Scale: Good Sitting balance - Comments: Steady static sitting, reaching within BOS   Standing balance support: Bilateral upper extremity supported;During functional activity Standing balance-Leahy Scale: Poor Standing balance comment: Requires MIN A to maintain static standing at EOB                           ADL either performed or assessed with clinical judgement   ADL Overall ADL's : Needs assistance/impaired                                       General ADL Comments: Pt functionally limited by cardiopulmonary status, decreased activity tolerance, decreased safety awareness, and generalized weakness. Requires MIN A for bed mobility and STS this date. MIN-MOD A for LB ADL management including bathing, dressing, and toileting.     Vision Baseline Vision/History: No visual deficits Patient Visual Report: No change from baseline       Perception     Praxis  Pertinent Vitals/Pain Pain Assessment: No/denies pain     Hand Dominance Right   Extremity/Trunk Assessment Upper Extremity Assessment Upper Extremity Assessment: Generalized weakness   Lower Extremity Assessment Lower Extremity Assessment: Generalized weakness   Cervical / Trunk Assessment Cervical / Trunk Assessment: Normal   Communication Communication Communication: No difficulties   Cognition  Arousal/Alertness: Awake/alert Behavior During Therapy: WFL for tasks assessed/performed Overall Cognitive Status: Within Functional Limits for tasks assessed                                 General Comments: Pt with mild difficulty following commands, but overall A&O x4.   General Comments  Pt vitals monitored t/o session. SpO2 noted to reach 86% with STS and bed mobility this date. Pt remains on 10L HFNC t/o session. spO2 rebounds to 92% with therapuetic rest break and cues for PLB. RN notified/aware.    Exercises Total Joint Exercises Ankle Circles/Pumps: Strengthening;Both;10 reps;15 reps Quad Sets: Strengthening;Both;10 reps Gluteal Sets: Strengthening;Both;10 reps Short Arc Quad: Strengthening;Both;10 reps Heel Slides: Both;10 reps;Strengthening Hip ABduction/ADduction: Both;10 reps;Strengthening Marching in Standing: AROM;Strengthening;Both;5 reps;Standing Other Exercises Other Exercises: Pt educated on falls prevention strategies, safe use of AE for functional mobility, ECS including: PLB, activity pacing, routines modifications to support safety and fxl independence upon hospital DC.   Shoulder Instructions      Home Living Family/patient expects to be discharged to:: Private residence Living Arrangements: Spouse/significant other;Children Available Help at Discharge: Family(Husband and 18 y.o son.) Type of Home: House Home Access: Level entry     Home Layout: One level(1 step down into dining room)     Bathroom Shower/Tub: Tub/shower unit;Curtain   Biochemist, clinical: Standard     Home Equipment: None          Prior Functioning/Environment Level of Independence: Independent        Comments: Stays home with 46 y.o., also works as Librarian, academic and substance abuse Social worker. I for all ADL/IADL management PTA.        OT Problem List: Decreased strength;Decreased coordination;Cardiopulmonary status limiting activity;Decreased activity  tolerance;Decreased safety awareness;Impaired balance (sitting and/or standing);Decreased knowledge of use of DME or AE      OT Treatment/Interventions: Self-care/ADL training;Therapeutic exercise;Therapeutic activities;DME and/or AE instruction;Patient/family education;Balance training;Energy conservation    OT Goals(Current goals can be found in the care plan section) Acute Rehab OT Goals Patient Stated Goal: To get back to caring for my son OT Goal Formulation: With patient Time For Goal Achievement: 04/23/20 Potential to Achieve Goals: Good ADL Goals Pt Will Perform Grooming: with set-up;with supervision;sitting Pt Will Transfer to Toilet: ambulating;with min assist;bedside commode(c LRAD PRN for safety) Pt Will Perform Toileting - Clothing Manipulation and hygiene: sit to/from stand;with min assist;with adaptive equipment(c LRAD PRN for safety) Additional ADL Goal #1: PT will independently verbalize a plan to implement at least 3 learned energy conservation strategies into her daily routines/home environment for improved safety and fxl independence upon hospital DC.  OT Frequency: Min 2X/week   Barriers to D/C: Decreased caregiver support          Co-evaluation              AM-PAC OT "6 Clicks" Daily Activity     Outcome Measure Help from another person eating meals?: A Little Help from another person taking care of personal grooming?: A Little Help from another person toileting, which includes using toliet, bedpan, or urinal?: A Lot Help from  another person bathing (including washing, rinsing, drying)?: A Lot Help from another person to put on and taking off regular upper body clothing?: A Little Help from another person to put on and taking off regular lower body clothing?: A Lot 6 Click Score: 15   End of Session Equipment Utilized During Treatment: Gait belt;Rolling walker Nurse Communication: Mobility status  Activity Tolerance: Patient tolerated treatment  well Patient left: in bed;with call bell/phone within reach;with bed alarm set  OT Visit Diagnosis: Other abnormalities of gait and mobility (R26.89);Muscle weakness (generalized) (M62.81)                Time: 6754-4920 OT Time Calculation (min): 47 min Charges:  OT General Charges $OT Visit: 1 Visit OT Evaluation $OT Eval Moderate Complexity: 1 Mod OT Treatments $Self Care/Home Management : 38-52 mins  Rockney Ghee, M.S., OTR/L Ascom: [phone] 04/09/20, 4:57 PM

## 2020-04-10 ENCOUNTER — Other Ambulatory Visit: Payer: Self-pay

## 2020-04-10 ENCOUNTER — Encounter: Payer: Self-pay | Admitting: Internal Medicine

## 2020-04-10 LAB — CBC WITH DIFFERENTIAL/PLATELET
Abs Immature Granulocytes: 0.26 10*3/uL — ABNORMAL HIGH (ref 0.00–0.07)
Basophils Absolute: 0 10*3/uL (ref 0.0–0.1)
Basophils Relative: 0 %
Eosinophils Absolute: 0 10*3/uL (ref 0.0–0.5)
Eosinophils Relative: 0 %
HCT: 24.9 % — ABNORMAL LOW (ref 36.0–46.0)
Hemoglobin: 7.4 g/dL — ABNORMAL LOW (ref 12.0–15.0)
Immature Granulocytes: 2 %
Lymphocytes Relative: 9 %
Lymphs Abs: 1.2 10*3/uL (ref 0.7–4.0)
MCH: 22.4 pg — ABNORMAL LOW (ref 26.0–34.0)
MCHC: 29.7 g/dL — ABNORMAL LOW (ref 30.0–36.0)
MCV: 75.2 fL — ABNORMAL LOW (ref 80.0–100.0)
Monocytes Absolute: 0.6 10*3/uL (ref 0.1–1.0)
Monocytes Relative: 4 %
Neutro Abs: 11.5 10*3/uL — ABNORMAL HIGH (ref 1.7–7.7)
Neutrophils Relative %: 85 %
Platelets: 259 10*3/uL (ref 150–400)
RBC: 3.31 MIL/uL — ABNORMAL LOW (ref 3.87–5.11)
RDW: 23.8 % — ABNORMAL HIGH (ref 11.5–15.5)
Smear Review: NORMAL
WBC: 13.5 10*3/uL — ABNORMAL HIGH (ref 4.0–10.5)
nRBC: 0 % (ref 0.0–0.2)

## 2020-04-10 LAB — BASIC METABOLIC PANEL
Anion gap: 6 (ref 5–15)
BUN: 10 mg/dL (ref 6–20)
CO2: 25 mmol/L (ref 22–32)
Calcium: 8.5 mg/dL — ABNORMAL LOW (ref 8.9–10.3)
Chloride: 107 mmol/L (ref 98–111)
Creatinine, Ser: 0.92 mg/dL (ref 0.44–1.00)
GFR calc Af Amer: >60 mL/min (ref >60)
GFR calc non Af Amer: >60 mL/min (ref >60)
Glucose, Bld: 120 mg/dL — ABNORMAL HIGH (ref 70–99)
Potassium: 3.8 mmol/L (ref 3.5–5.1)
Sodium: 138 mmol/L (ref 135–145)

## 2020-04-10 LAB — MAGNESIUM: Magnesium: 2 mg/dL (ref 1.7–2.4)

## 2020-04-10 LAB — PHOSPHORUS: Phosphorus: 3.8 mg/dL (ref 2.5–4.6)

## 2020-04-10 MED ORDER — FUROSEMIDE 10 MG/ML IJ SOLN
40.0000 mg | Freq: Every day | INTRAMUSCULAR | Status: DC
  Administered 2020-04-10 – 2020-04-16 (×7): 40 mg via INTRAVENOUS
  Filled 2020-04-10 (×7): qty 4

## 2020-04-10 MED ORDER — AMLODIPINE BESYLATE 5 MG PO TABS
5.0000 mg | ORAL_TABLET | Freq: Once | ORAL | Status: AC
  Administered 2020-04-10: 5 mg via ORAL
  Filled 2020-04-10: qty 1

## 2020-04-10 MED ORDER — AMLODIPINE BESYLATE 10 MG PO TABS
10.0000 mg | ORAL_TABLET | Freq: Every day | ORAL | Status: DC
  Administered 2020-04-11 – 2020-04-16 (×6): 10 mg via ORAL
  Filled 2020-04-10 (×6): qty 1

## 2020-04-10 NOTE — Progress Notes (Signed)
Nutrition Follow-up  DOCUMENTATION CODES:   Morbid obesity  INTERVENTION:  Continue Ensure TID (chocolate) Discontinue Magic Cup due to patient dislike Update HealthTouch with patient preferences Encouraged po intake of meals and supplements  NUTRITION DIAGNOSIS:   Increased nutrient needs related to catabolic illness(COVID-19) as evidenced by estimated needs. Ongoing  GOAL:   Patient will meet greater than or equal to 90% of their needs Progressing, patient consuming 50% of meals from 5/26-5/27 per flowsheets and is drinking Ensure supplements TID.  MONITOR:   PO intake, Supplement acceptance, Labs, Weight trends, I & O's  REASON FOR ASSESSMENT:   Consult Assessment of nutrition requirement/status  ASSESSMENT:   44 year old female with PMHx of GERD, anxiety, hx CVA 2018 admitted with COVID PNA and renal failure.  Patient has completed remdesivir,  transferred to stepdown on 5/24.  Spoke with patient via phone, she reports feeling much better. She endorses good appetite, recalls scrambled eggs and bacon this morning, unable to eat the grits, stated they were hard and there was no  spread sent on her tray. Patient reports that she is drinking Ensure and likes the chocolate flavor. She does not like Magic Cup supplement, will discontinue. Patient requesting orange jello and would like a rice crispy treat.  Admit wt 256 lb    Current wt 263 lb  Medications reviewed and include: Decadron, Miralax, Senokot Labs reviewed  Diet Order:   Diet Order            Diet regular Room service appropriate? Yes; Fluid consistency: Thin  Diet effective now              EDUCATION NEEDS:   No education needs have been identified at this time  Skin:  Skin Assessment: Reviewed RN Assessment  Last BM:  5/27  Height:   Ht Readings from Last 1 Encounters:  03/26/20 5\' 6"  (1.676 m)    Weight:   Wt Readings from Last 1 Encounters:  04/10/20 119.5 kg    BMI:  Body mass  index is 42.52 kg/m.  Estimated Nutritional Needs:   Kcal:  2300-2500  Protein:  115-125 grams  Fluid:  >/= 2 L/day   04/12/20, RD, LDN Clinical Nutrition After Hours/Weekend Pager # in Amion

## 2020-04-10 NOTE — Progress Notes (Signed)
PROGRESS NOTE    Diane Keller  Diane Keller DOB: 10-21-1976 DOA: 03/26/2020 PCP: Birdie Sons, MD    Brief Narrative:  Patient is a 44 year old female with history of acid reflex and history of stroke with residual left-sided weakness who present to the emergency room on 5/13 with generalized weakness and a fever. She was hypotensive upon arrival. She was diagnosed with severe COVID-19 Pneumonia, with secondary bacterial pneumonia, acute kidney injury and hypovolemic shock,and acute hypoxemic respiratory failure. Patient has completed  remdesivir. Patient never required hemodialysis. Did not require intubation.  Patient was transferred to stepdown unit from ICU on 5/24.    Consultants:   Nephrology, PCCM, cardiology  Procedures:  Antimicrobials:   None   Subjective: Requiring 6 L of oxygen right now.  With ambulation she does require higher high flow oxygen.  Although patient states she feels much better.  Objective: Vitals:   04/10/20 0858 04/10/20 0923 04/10/20 1030 04/10/20 1503  BP:  (!) 183/102 (!) 153/91 (!) 142/90  Pulse:  82 80   Resp:   (!) 22   Temp: 98.2 F (36.8 C)     TempSrc: Oral     SpO2:   96%   Weight:      Height:        Intake/Output Summary (Last 24 hours) at 04/10/2020 1641 Last data filed at 04/10/2020 0836 Gross per 24 hour  Intake 380 ml  Output 800 ml  Net -420 ml   Filed Weights   04/07/20 0221 04/09/20 0500 04/10/20 0500  Weight: 121 kg 119.7 kg 119.5 kg    Examination:  General exam: Appears calm and comfortable , nad Respiratory system: Scattered bibasilar rhonchi's, no wheezing or rails Cardiovascular system: S1 & S2 heard, RRR. No JVD, murmurs, rubs, gallops or clicks.  Gastrointestinal system: Abdomen is nondistended, soft and nontender.. Normal bowel sounds heard. Central nervous system: Alert and oriented x3.  Grossly intact Extremities: No edema or cyanosos Skin: Warm dry Psychiatry: . Mood & affect  appropriate in current setting.     Data Reviewed: I have personally reviewed following labs and imaging studies  CBC: Recent Labs  Lab 04/06/20 0411 04/07/20 0408 04/08/20 0534 04/09/20 0708 04/10/20 0500  WBC 15.7* 11.6* 12.5* 13.7* 13.5*  NEUTROABS 12.5* 9.0* 9.9* 11.6* 11.5*  HGB 8.0* 7.6* 7.2* 7.9* 7.4*  HCT 27.4* 25.6* 24.1* 26.5* 24.9*  MCV 74.9* 75.1* 75.3* 74.2* 75.2*  PLT 367 278 253 239 696   Basic Metabolic Panel: Recent Labs  Lab 04/06/20 0411 04/07/20 0408 04/08/20 0534 04/09/20 0708 04/10/20 0500  NA 141 142 141 141 138  K 3.5 3.5 3.4* 3.8 3.8  CL 107 109 108 108 107  CO2 27 27 25 26 25   GLUCOSE 104* 82 92 104* 120*  BUN 19 14 11 10 10   CREATININE 0.94 1.10* 0.90 0.92 0.92  CALCIUM 8.2* 8.1* 8.2* 8.4* 8.5*  MG 1.9 1.5* 1.6* 1.7 2.0  PHOS 3.1 3.1 3.8 3.6 3.8   GFR: Estimated Creatinine Clearance: 100.6 mL/min (by C-G formula based on SCr of 0.92 mg/dL). Liver Function Tests: No results for input(s): AST, ALT, ALKPHOS, BILITOT, PROT, ALBUMIN in the last 168 hours. No results for input(s): LIPASE, AMYLASE in the last 168 hours. No results for input(s): AMMONIA in the last 168 hours. Coagulation Profile: No results for input(s): INR, PROTIME in the last 168 hours. Cardiac Enzymes: No results for input(s): CKTOTAL, CKMB, CKMBINDEX, TROPONINI in the last 168 hours. BNP (last 3 results) No  results for input(s): PROBNP in the last 8760 hours. HbA1C: No results for input(s): HGBA1C in the last 72 hours. CBG: No results for input(s): GLUCAP in the last 168 hours. Lipid Profile: No results for input(s): CHOL, HDL, LDLCALC, TRIG, CHOLHDL, LDLDIRECT in the last 72 hours. Thyroid Function Tests: No results for input(s): TSH, T4TOTAL, FREET4, T3FREE, THYROIDAB in the last 72 hours. Anemia Panel: No results for input(s): VITAMINB12, FOLATE, FERRITIN, TIBC, IRON, RETICCTPCT in the last 72 hours. Sepsis Labs: Recent Labs  Lab 04/08/20 0534  PROCALCITON  0.32    No results found for this or any previous visit (from the past 240 hour(s)).       Radiology Studies: No results found.      Scheduled Meds: . sodium chloride   Intravenous Once  . aerochamber plus with mask  1 each Other Once  . albuterol  1 puff Inhalation Q4H  . [START ON 04/11/2020] amLODipine  10 mg Oral Daily  . B-complex with vitamin C  1 tablet Oral Daily  . buprenorphine-naloxone  1 tablet Sublingual Daily  . Chlorhexidine Gluconate Cloth  6 each Topical Daily  . ciclesonide  4 puff Inhalation BID  . dexamethasone (DECADRON) injection  4 mg Intravenous Q24H  . feeding supplement (ENSURE ENLIVE)  237 mL Oral TID BM  . ipratropium  2 puff Inhalation Q4H  . labetalol  300 mg Oral BID  . melatonin  5 mg Oral QHS  . pantoprazole  40 mg Oral Daily  . polyethylene glycol  17 g Oral Daily  . senna-docusate  2 tablet Oral BID  . sildenafil  20 mg Oral TID  . sodium chloride flush  10-40 mL Intracatheter Q12H  . sodium chloride flush  3 mL Intravenous Q12H   Continuous Infusions: . sodium chloride      Assessment & Plan:   Active Problems:   Renal failure   Acute hypoxemic respiratory failure (HCC)   Pneumonia due to COVID-19 virus   Iron deficiency anemia due to chronic blood loss  #1.  Acute hypoxemic respiratory failure secondary to COVID-19 pneumonia. Still requiring much o2, has been weaned down from 15L to 56L now.  Drops sats with ambulation and needs more 02l.   Echocardiogram showed a mild pulmonary hypertension with normal ejection fraction.  Patient had a completed a course of antibiotics for bacterial infection.  We will continue inhalers and steroids Will f/u on pccm recommendation   2.  Severe iron deficiency anemia. Required PRBC transfusion, also received IV iron. No evidence of active bleeding. Continue to monitor CBC., will transfuse if Hg <7 Today Hg 7.9  3.  Acute kidney injury with ATN.  Secondary to Covid infection. Renal  function has improved.  4.  Chronic diastolic congestive heart failure with mild pulmonary hypertension. BNP is normal, euvolemic on exam  5.  Morbid obesity.  6.  E. coli UTI. Antibiotics completed.   DVT prophylaxis:SCDs Code Status:Full Family Communication:None Disposition Plan:SNF Barrier: Medically still not optimized, will need snf, case mx working on it. Need to wean down 02. Need to meet safe d/c planning . Likely few more days as she still requires iv steroids        LOS: 15 days   Time spent:45 minutes with more than 50% on COC    Lynn Ito, MD [facility] Pager [phone]  If 7PM-7AM, please contact night-coverage [email or web address] Password [email or web address] 04/10/2020, 4:41 PM Patient ID: Diane Keller, female   DOB: [birth year], 44 y.o.  MRN: [id number]

## 2020-04-10 NOTE — Progress Notes (Signed)
Pharmacy Electrolyte Monitoring Consult:  Pharmacy consulted to assist in monitoring and replacing electrolytes in this 44 y.o. female admitted on 03/26/2020 with COVID-19 infection and AKI. Patient is at risk for refeeding syndrome.   Labs:  Sodium (mmol/L)  Date Value  04/10/2020 138  05/11/2012 140   Potassium (mmol/L)  Date Value  04/10/2020 3.8  05/11/2012 3.2 (L)   Magnesium (mg/dL)  Date Value  65/01/5464 2.0   Phosphorus (mg/dL)  Date Value  68/10/7516 3.8   Calcium (mg/dL)  Date Value  00/17/4944 8.5 (L)   Calcium, Total (mg/dL)  Date Value  96/75/9163 9.2   Albumin (g/dL)  Date Value  84/66/5993 2.0 (L)  05/11/2012 3.7   Corrected Ca: 10.0 mg/dL  Assessment/Plan:  Electrolytes:  Goal for electrolytes WNL   No replacement warranted at this time.   Glucose: Poor nutritional intake  Day # 7 IV steroids: CBG controlled  Constipation:   LBM 5/27  Currently on miralax and senokot-S scheduled plus prn docusate   Consulted initiated in ICU. Patient has now transferred to the floor.   Pharmacy will sign on at this time.    Gardner Candle, PharmD, BCPS Clinical Pharmacist 04/10/2020 8:17 AM

## 2020-04-10 NOTE — Consult Note (Signed)
Pulmonary Medicine          Date: 04/10/2020,   MRN# [id number] Diane Keller 16-Feb-1976     AdmissionWeight: 116.3 kg                 CurrentWeight: 119.5 kg   Referring physician: Dr. Marylu Lund   CHIEF COMPLAINT:   Acute hypoxemic respiratory failure due to COVID-19 pneumonia   HISTORY OF PRESENT ILLNESS   44 y.o.femalewith a history of GERD and prior stroke with residual left-sided motor deficit who is brought to the ED due to fever and generalized weakness.EMS described the patient is having hypotension prior to arrival, but lowest blood pressure they observed was 95/70. Patient is having lightheadedness nausea and malaise as well as shortness of breath and cough. Admitted with COVID-19 pneumonia, acute oxyntic respiratory failure due to COVID-19, acute renal failure and anemia.    5/14 admitted for COVID pneumonia, anemia and Renal failure 5/148L [place], worsening renal function 5/15 very good UOP, 1500 cc's 5/16 good UOP 1200CC's 5/17- patient had episode of respiratory distress with hypoxemia, we discussed this with daughter, high risk for intubation 5/18-  Patient is hemodynamically improved.  She is with confusion and intermittent lethargy.   04/01/20- patient still with confusion, FIO2 requiremnt has improved on 60% 35L/min 04/02/20- patient remains with intermittent confusion, she seems to have component of icu delerium and possible metabolic encephalopathy 04/03/20- mental status clearing.  Started on sildenafil for persistent hypoxemia,in the setting of COVID-19 and evidence of pulmonary hypertension on 2D echo.  FiO2 requirements to 80% with a flow rate of 55 L 04/04/20- inflammatory markers rising started back on low-dose dexamethasone.  O2 and flow requirements decreasing since starting sildenafil.  FiO2 to 70% flow to 45 L. 04/05/20- wean down to 8 L bubble high flow.  Comfortable.  Received another unit of PRBCs.  Mental status clear.   PAST MEDICAL  HISTORY   Past Medical History:  Diagnosis Date   Anxiety    GERD (gastroesophageal reflux disease)    Stroke (HCC) 2018     SURGICAL HISTORY   Past Surgical History:  Procedure Laterality Date   CESAREAN SECTION       FAMILY HISTORY   Family History  Problem Relation Age of Onset   Hypertension Father    Heart attack Father    Cerebrovascular Accident Father      SOCIAL HISTORY   Social History   Tobacco Use   Smoking status: Former Smoker    Types: Cigarettes   Smokeless tobacco: Never Used  Substance Use Topics   Alcohol use: Yes   Drug use: No     MEDICATIONS    Home Medication:    Current Medication:  Current Facility-Administered Medications:    0.9 %  sodium chloride infusion (Manually program via Guardrails IV Fluids), , Intravenous, Once, Harlon Ditty D, NP, Stopped at 03/27/20 1632   0.9 %  sodium chloride infusion, 250 mL, Intravenous, PRN, Erin Fulling, MD   acetaminophen (TYLENOL) tablet 650 mg, 650 mg, Oral, Q4H PRN, Erin Fulling, MD, 650 mg at 04/02/20 2133   aerochamber plus with mask device 1 each, 1 each, Other, Once, Erin Fulling, MD, Stopped at 03/26/20 2214   albuterol (VENTOLIN HFA) 108 (90 Base) MCG/ACT inhaler 1 puff, 1 puff, Inhalation, Q4H, Kasa, Kurian, MD, 1 puff at 04/10/20 0335   alum & mag hydroxide-simeth (MAALOX/MYLANTA) 200-200-20 MG/5ML suspension 30 mL, 30 mL, Oral, Q4H PRN, Manuela Schwartz, NP, 30 mL  at 04/10/20 0646   amLODipine (NORVASC) tablet 5 mg, 5 mg, Oral, Daily, Bhutani, Manpreet S, MD, 5 mg at 04/09/20 [id number]   B-complex with vitamin C tablet 1 tablet, 1 tablet, Oral, Daily, Erin FullingKasa, Kurian, MD, 1 tablet at 04/09/20 [id number]   buprenorphine-naloxone (SUBOXONE) 8-2 mg per SL tablet 1 tablet, 1 tablet, Sublingual, Daily, Erin FullingKasa, Kurian, MD, 1 tablet at 04/09/20 [id number]   Chlorhexidine Gluconate Cloth 2 % PADS 6 each, 6 each, Topical, Daily, Erin FullingKasa, Kurian, MD, 6 each at 04/09/20 2211   ciclesonide  (ALVESCO) 160 MCG/ACT inhaler 4 puff, 4 puff, Inhalation, BID, Erin FullingKasa, Kurian, MD, 4 puff at 04/09/20 2108   dexamethasone (DECADRON) injection 4 mg, 4 mg, Intravenous, Q24H, Salena SanerGonzalez, Carmen L, MD, 4 mg at 04/09/20 0925   docusate sodium (COLACE) capsule 100 mg, 100 mg, Oral, BID PRN, Erin FullingKasa, Kurian, MD, 100 mg at 04/05/20 0745   feeding supplement (ENSURE ENLIVE) (ENSURE ENLIVE) liquid 237 mL, 237 mL, Oral, TID BM, Belia HemanKasa, Kurian, MD, 237 mL at 04/08/20 1000   ipratropium (ATROVENT HFA) inhaler 2 puff, 2 puff, Inhalation, Q4H, Kasa, Wallis BambergKurian, MD, 2 puff at 04/10/20 0336   labetalol (NORMODYNE) injection 10 mg, 10 mg, Intravenous, Q2H PRN, Harlon DittyKeene, Jeremiah D, NP, 10 mg at 04/06/20 0139   labetalol (NORMODYNE) tablet 300 mg, 300 mg, Oral, BID, Kolluru, Sarath, MD, 300 mg at 04/09/20 [id number]   lip balm (BLISTEX) ointment, , Topical, PRN, Harlon DittyKeene, Jeremiah D, NP   melatonin tablet 5 mg, 5 mg, Oral, QHS, Manuela SchwartzMorrison, Brenda, NP, 5 mg at 04/09/20 2107   ondansetron [facility](ZOFRAN) injection 4 mg, 4 mg, Intravenous, Q6H PRN, Erin FullingKasa, Kurian, MD, 4 mg at 04/02/20 0909   pantoprazole (PROTONIX) EC tablet 40 mg, 40 mg, Oral, Daily, Manuela SchwartzMorrison, Brenda, NP, 40 mg at 04/09/20 0919   polyethylene glycol (MIRALAX / GLYCOLAX) packet 17 g, 17 g, Oral, Daily, Cherly HensenMoran, Christopher W, RPH, 17 g at 04/07/20 1036   senna-docusate (Senokot-S) tablet 2 tablet, 2 tablet, Oral, BID, Cherly HensenMoran, Christopher W, [facility] HospitalRPH, 2 tablet at 04/09/20 2107   sildenafil (REVATIO) tablet 20 mg, 20 mg, Oral, TID, Salena SanerGonzalez, Carmen L, MD, 20 mg at 04/09/20 0920   sodium chloride flush (NS) 0.9 % injection 10-40 mL, 10-40 mL, Intracatheter, Q12H, Kasa, Wallis BambergKurian, MD, 20 mL at 04/09/20 2212   sodium chloride flush (NS) 0.9 % injection 10-40 mL, 10-40 mL, Intracatheter, PRN, Erin FullingKasa, Kurian, MD   sodium chloride flush (NS) 0.9 % injection 3 mL, 3 mL, Intravenous, Q12H, Kasa, Kurian, MD, 3 mL at 04/09/20 0933   sodium chloride flush (NS) 0.9 % injection 3 mL, 3 mL,  Intravenous, PRN, Erin FullingKasa, Kurian, MD   traZODone (DESYREL) tablet 25 mg, 25 mg, Oral, QHS PRN, Mansy, Jan A, MD    ALLERGIES   Augmentin [amoxicillin-pot clavulanate] and Lisinopril     REVIEW OF SYSTEMS    Review of Systems:  Gen:  Denies  fever, sweats, chills weigh loss  HEENT: Denies blurred vision, double vision, ear pain, eye pain, hearing loss, nose bleeds, sore throat Cardiac:  No dizziness, chest pain or heaviness, chest tightness,edema Resp:   Denies cough or sputum porduction, shortness of breath,wheezing, hemoptysis,  Gi: Denies swallowing difficulty, stomach pain, nausea or vomiting, diarrhea, constipation, bowel incontinence Gu:  Denies bladder incontinence, burning urine Ext:   Denies Joint pain, stiffness or swelling Skin: Denies  skin rash, easy bruising or bleeding or hives Endoc:  Denies polyuria, polydipsia , polyphagia or weight change Psych:   Denies depression, insomnia or  hallucinations   Other:  All other systems negative   VS: BP (!) 152/92 (BP Location: Left Arm)    Pulse 81    Temp 98.2 F (36.8 C) (Oral)    Resp (!) 22    Ht 5\' 6"  (1.676 m)    Wt 119.5 kg    SpO2 98%    BMI 42.52 kg/m      PHYSICAL EXAM    GENERAL:NAD, no fevers, chills, no weakness no fatigue HEAD: Normocephalic, atraumatic.  EYES: Pupils equal, round, reactive to light. Extraocular muscles intact. No scleral icterus.  MOUTH: Moist mucosal membrane. Dentition intact. No abscess noted.  EAR, NOSE, THROAT: Clear without exudates. No external lesions.  NECK: Supple. No thyromegaly. No nodules. No JVD.  PULMONARY: bilateral rhonchorous breath sounds CARDIOVASCULAR: S1 and S2. Regular rate and rhythm. No murmurs, rubs, or gallops. No edema. Pedal pulses 2+ bilaterally.  GASTROINTESTINAL: Soft, nontender, nondistended. No masses. Positive bowel sounds. No hepatosplenomegaly.  MUSCULOSKELETAL: No swelling, clubbing, or edema. Range of motion full in all extremities.  NEUROLOGIC:  Cranial nerves II through XII are intact. No gross focal neurological deficits. Sensation intact. Reflexes intact.  SKIN: No ulceration, lesions, rashes, or cyanosis. Skin warm and dry. Turgor intact.  PSYCHIATRIC: Mood, affect within normal limits. The patient is awake, alert and oriented x 3. Insight, judgment intact.       IMAGING    DG Abdomen 1 View  Result Date: 03/26/2020 CLINICAL DATA:  Shortness of breath.  Abdominal mass. EXAM: ABDOMEN - 1 VIEW COMPARISON:  None FINDINGS: Normal amount of gas within the intestine. No definite soft tissue mass by radiography. Some potential for increased density projected over the left pelvis, not a reliable finding. No abnormal calcifications or significant bone findings. IMPRESSION: Gas pattern within normal limits. Possible increased density of the left pelvis that could represent a mass. Electronically Signed   By: Nelson Chimes M.D.   On: 03/26/2020 13:44   CT HEAD WO CONTRAST  Result Date: 04/07/2020 CLINICAL DATA:  Altered mental status, COVID-19 pneumonia EXAM: CT HEAD WITHOUT CONTRAST TECHNIQUE: Contiguous axial images were obtained from the base of the skull through the vertex without intravenous contrast. COMPARISON:  04/02/2020 FINDINGS: Brain: There is no acute intracranial hemorrhage, mass effect, or edema. There is no new loss of gray-white differentiation. Small area of encephalomalacia is again identified in the region the right lentiform nucleus. There is no extra-axial fluid collection. Ventricles and sulci are within normal limits in size and configuration. Vascular: No hyperdense vessel or unexpected calcification. Skull: Calvarium is unremarkable. Sinuses/Orbits: Nonspecific partial ethmoid and sphenoid sinus opacification layering secretions. Visualized orbits are unremarkable. Other: Mastoid air cells are clear. IMPRESSION: No acute intracranial abnormality. No significant change since 04/02/2020. Electronically Signed   By: Macy Mis M.D.   On: 04/07/2020 16:19   CT HEAD WO CONTRAST  Result Date: 04/02/2020 CLINICAL DATA:  44 year old female with COVID-19. Pneumonia. Delirium, confusion, hypoxia. EXAM: CT HEAD WITHOUT CONTRAST TECHNIQUE: Contiguous axial images were obtained from the base of the skull through the vertex without intravenous contrast. COMPARISON:  Brain MRI and intracranial MRA 05/11/2017. Head CT 05/10/2017. FINDINGS: Brain: Bulky chronic dural calcifications again noted. 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 Underlying cerebral volume remains normal. No midline shift, ventriculomegaly, mass effect, evidence of mass lesion, intracranial hemorrhage or evidence of cortically based acute infarction. No other encephalomalacia identified. Vascular: No suspicious intracranial vascular hyperdensity. Skull:  No acute osseous abnormality identified. Sinuses/Orbits: Widespread bilateral paranasal sinus fluid and bubbly opacity although improved sinus aeration compared to the 2018 exams. Hyperplastic sphenoid wing, petrous apex and mastoid air cells remain well pneumatized. There is new minimal to mild left side mastoid effusion. Other: Negative orbit and scalp soft tissues. IMPRESSION: 1. 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 2. No new intracranial abnormality. 3. Widespread paranasal sinus inflammation, although improved compared to 2018. Electronically Signed   By: Odessa Fleming M.D.   On: 04/02/2020 18:22   US RENAL  Result Date: 03/27/2020 CLINICAL DATA:  Acute tubular necrosis EXAM: RENAL / URINARY TRACT ULTRASOUND COMPLETE COMPARISON:  None. FINDINGS: Right Kidney: Renal measurements: 13 x 5 x 6 cm = volume: 214 mL. Increased echogenicity. No hydronephrosis or mass Left Kidney: Renal measurements: 14 x 7.4 x 6 cm = volume: 325 mL. Increased echogenicity. No hydronephrosis or mass Bladder: Moderate  distension of the bladder despite a Foley catheter. No focal finding. Other: Cholelithiasis is incidentally noted.  No wall thickening. IMPRESSION: 1. 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 If renal failure is acute, inflammatory nephropathy would also be considered. 2. Cholelithiasis. 3. Moderate distension of the bladder despite a Foley catheter, please ensure the catheter is functional. Electronically Signed   By: Marnee Spring M.D.   On: 03/27/2020 05:27   US Venous Img Lower Bilateral (DVT)  Result Date: 03/27/2020 CLINICAL DATA:  Acute respiratory failure with hypoxia EXAM: BILATERAL LOWER EXTREMITY VENOUS DOPPLER ULTRASOUND TECHNIQUE: Gray-scale sonography with compression, as well as color and duplex ultrasound, were performed to evaluate the deep venous system(s) from the level of the common femoral vein through the popliteal and proximal calf veins. COMPARISON:  None. FINDINGS: VENOUS Normal compressibility of the common femoral, superficial femoral, and popliteal veins, as well as the visualized calf veins. Visualized portions of profunda femoral vein and great saphenous vein unremarkable. No filling defects to suggest DVT on grayscale or color Doppler imaging. Doppler waveforms show normal direction of venous flow, normal respiratory plasticity and response to augmentation. Limited views of the contralateral common femoral vein are unremarkable. IMPRESSION: Negative. Electronically Signed   By: Marnee Spring M.D.   On: 03/27/2020 05:07   DG Chest Port 1 View  Result Date: 04/07/2020 CLINICAL DATA:  Hypoxia.  COVID-19 positive EXAM: PORTABLE CHEST 1 VIEW COMPARISON:  Apr 01, 2020 FINDINGS: Right peripherally inserted central catheter crosses from the right side with tip in left innominate vein. No pneumothorax. There is airspace opacity throughout much of the right upper lobe as well as in the left upper lobe and left base regions. A  lesser degree of opacity is noted in the right base region. There is no appreciable adenopathy. Heart is upper normal in size with pulmonary vascularity within normal limits. No bone lesions. IMPRESSION: Multifocal airspace opacity, significantly more than on most recent study consistent with multifocal pneumonia. Right peripherally inserted central catheter has its tip in the left innominate vein. No pneumothorax. Electronically Signed   By: Bretta Bang III M.D.   On: 04/07/2020 13:20   DG Chest Port 1 View  Result Date: 04/01/2020 CLINICAL DATA:  Acute respiratory failure.  COVID-19 positive. EXAM: PORTABLE CHEST 1 VIEW COMPARISON:  03/30/2020 FINDINGS: Right-sided PICC line has tip overlying the SVC. Lungs are hypoinflated with persistent patchy bilateral airspace opacification with slight overall improvement compatible with known multifocal pneumonia. Suggestion of a small right pleural effusion  with associated basilar atelectasis which is new. Cardiomediastinal silhouette and remainder of the exam is unchanged. IMPRESSION: 1. Hypoinflation with persistent multifocal airspace process with slight overall improvement likely multifocal pneumonia. New small right pleural effusion with mild associated basilar atelectasis. 2.  Right-sided PICC line with tip over the SVC. Electronically Signed   By: Elberta Fortis M.D.   On: 04/01/2020 07:15   DG Chest Port 1 View  Result Date: 03/30/2020 CLINICAL DATA:  Patient with vomiting. Increased oxygen requirement. EXAM: PORTABLE CHEST 1 VIEW COMPARISON:  Chest radiograph 03/30/2020. FINDINGS: Monitoring leads overlie the patient. Stable cardiomegaly. Similar-appearing bilateral patchy areas of consolidation. Elevation right hemidiaphragm. Probable small left pleural effusion. No pneumothorax. IMPRESSION: Similar-appearing bilateral airspace opacities. Probable small left pleural effusion. Electronically Signed   By: Annia Belt M.D.   On: 03/30/2020 14:37   DG  Chest Port 1 View  Result Date: 03/30/2020 CLINICAL DATA:  Shortness of breath EXAM: PORTABLE CHEST 1 VIEW COMPARISON:  03/27/2020 FINDINGS: Persistent bilateral opacities. Slightly decreased left mid lung consolidation. There is otherwise overall similar lung aeration. No significant pleural effusion. No pneumothorax. Stable cardiomediastinal contours. IMPRESSION: Persistent bilateral opacities with slightly decreased left mid lung consolidation. Otherwise similar lung aeration compared to 03/27/2020. Electronically Signed   By: Guadlupe Spanish M.D.   On: 03/30/2020 09:05   DG Chest Port 1 View  Result Date: 03/27/2020 CLINICAL DATA:  Short of breath, COVID-19 positive EXAM: PORTABLE CHEST 1 VIEW COMPARISON:  03/26/2020 FINDINGS: Single frontal view of the chest demonstrates progressive bilateral multifocal airspace disease consistent with pneumonia. No effusion or pneumothorax. Cardiac silhouette is stable. IMPRESSION: 1. Progressive multifocal bilateral pneumonia. Electronically Signed   By: Sharlet Salina M.D.   On: 03/27/2020 01:12   DG Chest Portable 1 View  Result Date: 03/26/2020 CLINICAL DATA:  Shortness of breath and hypoxia. Abdominal mass. Fever and weakness. EXAM: PORTABLE CHEST 1 VIEW COMPARISON:  04/23/2012 FINDINGS: Cardiomegaly. Mediastinal shadows are normal. Areas of patchy bilateral pulmonary density that could represent patchy bronchopneumonia. An element of edema is possible. No visible effusion. No significant bone finding. IMPRESSION: Patchy bilateral pulmonary densities most consistent with pneumonia. Possible that there could be an element of edema. Electronically Signed   By: Paulina Fusi M.D.   On: 03/26/2020 13:43   ECHOCARDIOGRAM COMPLETE BUBBLE STUDY  Result Date: 03/27/2020    ECHOCARDIOGRAM REPORT   Patient Name:   ZORAH BACKES Date of Exam: 03/27/2020 Medical Rec #:  [id number]        Height:       66.0 in Accession #:    [id number]       Weight:       256.4 lb Date  of Birth:  Oct 12, 1976         BSA:          2.222 m Patient Age:    44 years         BP:           104/74 mmHg Patient Gender: F                HR:           81 bpm. Exam Location:  [facility] Procedure: 2D Echo, Color Doppler and Cardiac Doppler Indications:     I63.9 Stroke  History:         Patient has prior history of Echocardiogram examinations. Pt  tested positive for covid-19 on 03/26/2020.  Sonographer:     Humphrey Rolls RDCS (AE) Referring Phys:  [id number] Erin Fulling Diagnosing Phys: Julien Nordmann MD IMPRESSIONS  1. Left ventricular ejection fraction, by estimation, is 60 to 65%. The left ventricle has normal function. The left ventricle has no regional wall motion abnormalities. There is mild left ventricular hypertrophy. Left ventricular diastolic parameters are consistent with Grade I diastolic dysfunction (impaired relaxation).  2. Right ventricular systolic function is normal. The right ventricular size is normal. There is mildly elevated pulmonary artery systolic pressure.  3. Mild mitral valve regurgitation.  4. Agitated saline contrast bubble study was negative, with no evidence of any interatrial shunt. FINDINGS  Left Ventricle: Left ventricular ejection fraction, by estimation, is 60 to 65%. The left ventricle has normal function. The left ventricle has no regional wall motion abnormalities. The left ventricular internal cavity size was normal in size. There is  mild left ventricular hypertrophy. Left ventricular diastolic parameters are consistent with Grade I diastolic dysfunction (impaired relaxation). Right Ventricle: The right ventricular size is normal. No increase in right ventricular wall thickness. Right ventricular systolic function is normal. There is mildly elevated pulmonary artery systolic pressure. The tricuspid regurgitant velocity is 3.03  m/s, and with an assumed right atrial pressure of 5 mmHg, the estimated right ventricular systolic pressure is 41.7 mmHg. Left Atrium: Left  atrial size was normal in size. Right Atrium: Right atrial size was normal in size. Pericardium: There is no evidence of pericardial effusion. Mitral Valve: The mitral valve is normal in structure. Normal mobility of the mitral valve leaflets. Mild mitral valve regurgitation. No evidence of mitral valve stenosis. MV peak gradient, 4.2 mmHg. The mean mitral valve gradient is 2.0 mmHg. Tricuspid Valve: The tricuspid valve is normal in structure. Tricuspid valve regurgitation is mild . No evidence of tricuspid stenosis. Aortic Valve: The aortic valve is normal in structure. Aortic valve regurgitation is not visualized. No aortic stenosis is present. Aortic valve mean gradient measures 5.0 mmHg. Aortic valve peak gradient measures 11.8 mmHg. Aortic valve area, by VTI measures 3.00 cm. Pulmonic Valve: The pulmonic valve was normal in structure. Pulmonic valve regurgitation is trivial. No evidence of pulmonic stenosis. Aorta: The aortic root is normal in size and structure. Venous: The inferior vena cava is normal in size with greater than 50% respiratory variability, suggesting right atrial pressure of 3 mmHg. IAS/Shunts: No atrial level shunt detected by color flow Doppler. Agitated saline contrast was given intravenously to evaluate for intracardiac shunting. Agitated saline contrast bubble study was negative, with no evidence of any interatrial shunt.  LEFT VENTRICLE PLAX 2D LVIDd:         4.70 cm  Diastology LVIDs:         2.72 cm  LV e' lateral:   6.31 cm/s LV PW:         1.28 cm  LV E/e' lateral: 12.8 LV IVS:        0.92 cm  LV e' medial:    5.66 cm/s LVOT diam:     2.10 cm  LV E/e' medial:  14.3 LV SV:         88 LV SV Index:   39 LVOT Area:     3.46 cm  RIGHT VENTRICLE RV Basal diam:  2.69 cm LEFT ATRIUM             Index       RIGHT ATRIUM  Index LA diam:        3.80 cm 1.71 cm/m  RA Area:     17.20 cm LA Vol (A2C):   75.5 ml 33.97 ml/m RA Volume:   42.10 ml  18.94 ml/m LA Vol (A4C):   47.6 ml  21.42 ml/m LA Biplane Vol: 61.5 ml 27.67 ml/m  AORTIC VALVE                    PULMONIC VALVE AV Area (Vmax):    2.66 cm     PV Vmax:       1.46 m/s AV Area (Vmean):   2.94 cm     PV Vmean:      101.000 cm/s AV Area (VTI):     3.00 cm     PV VTI:        0.287 m AV Vmax:           172.00 cm/s  PV Peak grad:  8.5 mmHg AV Vmean:          103.000 cm/s PV Mean grad:  5.0 mmHg AV VTI:            0.292 m AV Peak Grad:      11.8 mmHg AV Mean Grad:      5.0 mmHg LVOT Vmax:         132.00 cm/s LVOT Vmean:        87.500 cm/s LVOT VTI:          0.253 m LVOT/AV VTI ratio: 0.87  AORTA Ao Root diam: 3.30 cm MITRAL VALVE                TRICUSPID VALVE MV Area (PHT): 3.77 cm     TR Peak grad:   36.7 mmHg MV Peak grad:  4.2 mmHg     TR Vmax:        303.00 cm/s MV Mean grad:  2.0 mmHg MV Vmax:       1.02 m/s     SHUNTS MV Vmean:      69.8 cm/s    Systemic VTI:  0.25 m MV Decel Time: 201 msec     Systemic Diam: 2.10 cm MV E velocity: 81.00 cm/s MV A velocity: 108.00 cm/s MV E/A ratio:  0.75 Julien Nordmann MD Electronically signed by Julien Nordmann MD Signature Date/Time: 03/27/2020/1:53:37 PM    Final    [place] EKG SITE RITE  Result Date: 03/30/2020 If Site Rite image not attached, placement could not be confirmed due to current cardiac rhythm.     ASSESSMENT/PLAN   Acute hypoxemic respiratory failure  - due to COVID19   - s/p full course of therapy with Remdesevir   - continue current care   -recommend trial of diuresis - patient is 7L net +   -fungitell to rule out PCP  UTI  due to ecoli >100K colonies  -completed abx        Thank you for allowing me to participate in the care of this patient.   Patient/Family are satisfied with care plan and all questions have been answered.  This document was prepared using Dragon voice recognition software and may include unintentional dictation errors.     Vida Rigger, M.D.  Division of Pulmonary & Critical Care Medicine  [facility]

## 2020-04-10 NOTE — Plan of Care (Signed)
Patient indicated we did not need to contact family for an update.  If they had further questions she would ask for them.  No further questions at this time.

## 2020-04-10 NOTE — Progress Notes (Signed)
Physical Therapy Treatment Patient Details Name: Diane Keller MRN: [id number] DOB: 1975-12-13 Today's Date: 04/10/2020    History of Present Illness Per MD notes: Pt is a 44 y.o. female with a history of GERD and prior stroke with residual left-sided motor deficit who is brought to the ED due to fever and generalized weakness.  MD assessment includes: COVID-19 pneumonia, AMS/confusion, morbid obesity, and AKI/renal failure.    PT Comments    Pt pleasant and motivated to participate during the session.  Pt put forth good effort during the session.  During graded therapeutic exercise pt's SpO2 remained >/= 88% with frequent therapeutic rest breaks and cues for proper breathing during exercise.  Upon coming to sitting the pt's SpO2 dropped to the mid 80s but returned to >/= 88% with cues for PLB.  Pt was able to amb around 6 feet at the EOB with Min A for stability before fatiguing and needing to return to sitting with SpO2 dropping to the low 80s.  Per nursing O2 increased to 7L from 6L with SpO2 increasing back to >/= 88% in around 30 sec.  Pt's O2 then returned to 6L with SpO2 at 90% at end of session, nursing notified. Pt will benefit from PT services in a SNF setting upon discharge to safely address deficits listed in patient problem list for decreased caregiver assistance and eventual return to PLOF.     Follow Up Recommendations  SNF     Equipment Recommendations  Rolling walker with 5" wheels    Recommendations for Other Services       Precautions / Restrictions Precautions Precautions: Fall Restrictions Weight Bearing Restrictions: No    Mobility  Bed Mobility Overal bed mobility: Modified Independent             General bed mobility comments: Extra time and effort required  Transfers Overall transfer level: Needs assistance Equipment used: Rolling walker (2 wheeled) Transfers: Sit to/from Stand Sit to Stand: Min guard;From elevated surface         General  transfer comment: Mod verbal cues for sequencing  Ambulation/Gait Ambulation/Gait assistance: Min assist Gait Distance (Feet): 6 Feet Assistive device: Rolling walker (2 wheeled) Gait Pattern/deviations: Step-through pattern;Trunk flexed Gait velocity: decreased   General Gait Details: Effortful and unsteady steps with posterior instability requiring min A to prevent LOB   Stairs             Wheelchair Mobility    Modified Rankin (Stroke Patients Only)       Balance Overall balance assessment: Needs assistance Sitting-balance support: Feet supported;No upper extremity supported Sitting balance-Leahy Scale: Good Sitting balance - Comments: Steady static sitting, reaching within BOS   Standing balance support: Bilateral upper extremity supported;During functional activity Standing balance-Leahy Scale: Poor Standing balance comment: Requires min A to maintain static standing at EOB                            Cognition Arousal/Alertness: Awake/alert Behavior During Therapy: WFL for tasks assessed/performed Overall Cognitive Status: Within Functional Limits for tasks assessed                                        Exercises Total Joint Exercises Ankle Circles/Pumps: Strengthening;Both;10 reps;15 reps(with manual resistance) Quad Sets: Strengthening;Both;10 reps;15 reps Gluteal Sets: Strengthening;Both;10 reps;15 reps Towel Squeeze: Strengthening;Both;10 reps Heel Slides: Both;10 reps;Strengthening;5 reps  Hip ABduction/ADduction: Both;10 reps;Strengthening;5 reps Straight Leg Raises: AAROM;Both;10 reps;5 reps Long Arc Quad: Strengthening;Both;10 reps Other Exercises Other Exercises: Pt education on PLB and breathing technique during therapeutic exercise Other Exercises: Multiple rolling left/right in bed for core therex Other Exercises: HEP education and review for BLE APs, QS, GS, and LAQs x 10 each 5-6x/day    General Comments         Pertinent Vitals/Pain Pain Assessment: No/denies pain    Home Living                      Prior Function            PT Goals (current goals can now be found in the care plan section) Progress towards PT goals: Progressing toward goals    Frequency    Min 2X/week      PT Plan Current plan remains appropriate    Co-evaluation              AM-PAC PT "6 Clicks" Mobility   Outcome Measure  Help needed turning from your back to your side while in a flat bed without using bedrails?: A Little Help needed moving from lying on your back to sitting on the side of a flat bed without using bedrails?: A Little Help needed moving to and from a bed to a chair (including a wheelchair)?: A Little Help needed standing up from a chair using your arms (e.g., wheelchair or bedside chair)?: A Little Help needed to walk in hospital room?: A Lot Help needed climbing 3-5 steps with a railing? : Total 6 Click Score: 15    End of Session Equipment Utilized During Treatment: Gait belt;Oxygen Activity Tolerance: Patient limited by fatigue;Treatment limited secondary to medical complications (Comment)(O2 desaturation and fatigue with amb)   Nurse Communication: Mobility status;Other (comment)(SpO2 with activity) PT Visit Diagnosis: Muscle weakness (generalized) (M62.81);Difficulty in walking, not elsewhere classified (R26.2)     Time: 7989-2119 PT Time Calculation (min) (ACUTE ONLY): 54 min  Charges:  $Therapeutic Exercise: 23-37 mins $Therapeutic Activity: 23-37 mins                     D. Scott Shere Eisenhart PT, DPT 04/10/20, 4:33 PM

## 2020-04-10 NOTE — TOC Progression Note (Signed)
Transition of Care [facility]) - Progression Note    Patient Details  Name: Diane Keller MRN: [id number] Date of Birth: 1976/03/22  Transition of Care [facility]) CM/SW Contact  Charlese Gruetzmacher, Lemar Livings, LCSW Phone Number: 04/10/2020, 2:22 PM  Clinical Narrative: Have expanded bed search for [name] for rehab bed. Pt is Medicaid and has no SSI or SSD to offer. Will see if can get bed offer and work on transfer, pt may stay here long enough to be able to return home with husband.    Expected Discharge Plan: Skilled Nursing Facility Barriers to Discharge: Continued Medical Work up  Expected Discharge Plan and Services Expected Discharge Plan: Skilled Nursing Facility       Living arrangements for the past 2 months: Single Family Home                                       Social Determinants of Health (SDOH) Interventions    Readmission Risk Interventions No flowsheet data found.

## 2020-04-11 LAB — CBC WITH DIFFERENTIAL/PLATELET
Abs Immature Granulocytes: 0.29 10*3/uL — ABNORMAL HIGH (ref 0.00–0.07)
Basophils Absolute: 0.1 10*3/uL (ref 0.0–0.1)
Basophils Relative: 1 %
Eosinophils Absolute: 0.1 10*3/uL (ref 0.0–0.5)
Eosinophils Relative: 1 %
HCT: 24.4 % — ABNORMAL LOW (ref 36.0–46.0)
Hemoglobin: 7.4 g/dL — ABNORMAL LOW (ref 12.0–15.0)
Immature Granulocytes: 2 %
Lymphocytes Relative: 15 %
Lymphs Abs: 1.8 10*3/uL (ref 0.7–4.0)
MCH: 22.4 pg — ABNORMAL LOW (ref 26.0–34.0)
MCHC: 30.3 g/dL (ref 30.0–36.0)
MCV: 73.7 fL — ABNORMAL LOW (ref 80.0–100.0)
Monocytes Absolute: 0.7 10*3/uL (ref 0.1–1.0)
Monocytes Relative: 5 %
Neutro Abs: 9.5 10*3/uL — ABNORMAL HIGH (ref 1.7–7.7)
Neutrophils Relative %: 76 %
Platelets: 262 10*3/uL (ref 150–400)
RBC: 3.31 MIL/uL — ABNORMAL LOW (ref 3.87–5.11)
RDW: 24.2 % — ABNORMAL HIGH (ref 11.5–15.5)
Smear Review: NORMAL
WBC: 12.4 10*3/uL — ABNORMAL HIGH (ref 4.0–10.5)
nRBC: 0.2 % (ref 0.0–0.2)

## 2020-04-11 LAB — MAGNESIUM: Magnesium: 1.5 mg/dL — ABNORMAL LOW (ref 1.7–2.4)

## 2020-04-11 LAB — PHOSPHORUS: Phosphorus: 4 mg/dL (ref 2.5–4.6)

## 2020-04-11 MED ORDER — MAGNESIUM SULFATE 2 GM/50ML IV SOLN
2.0000 g | Freq: Once | INTRAVENOUS | Status: AC
  Administered 2020-04-11: 2 g via INTRAVENOUS
  Filled 2020-04-11: qty 50

## 2020-04-11 NOTE — Progress Notes (Signed)
   04/11/20 1200  Family/Significant Other Communication  Family/Significant Other Update Other (Comment) (pt updated family )

## 2020-04-11 NOTE — Progress Notes (Signed)
Weaned to RA sats dropped to 84% MD at bedside reapplied o2 4l [place]. sats increase to 94%. Per MD Amer[facility] leave pt at 4 L for now.

## 2020-04-11 NOTE — Progress Notes (Signed)
PROGRESS NOTE    Diane Keller  QIH:[id number] DOB: [birth year] DOA: 03/26/2020 PCP: Birdie Sons, MD    Brief Narrative:  Patient is a 44 year old female with history of acid reflex and history of stroke with residual left-sided weakness who present to the emergency room on 5/13 with generalized weakness and a fever. She was hypotensive upon arrival. She was diagnosed with severe COVID-19 Pneumonia, with secondary bacterial pneumonia, acute kidney injury and hypovolemic shock,and acute hypoxemic respiratory failure. Patient has completed  remdesivir. Patient never required hemodialysis. Did not require intubation.  Patient was transferred to stepdown unit from ICU on 5/24.    Consultants:   Nephrology, PCCM, cardiology  Procedures:  Antimicrobials:   None   Subjective: O2 weaned down to 4 L now.  States she feels.  No new complaints 700 mL this am output.  Objective: Vitals:   04/10/20 1616 04/10/20 2121 04/10/20 2349 04/11/20 0935  BP: (!) 141/92 (!) 173/101 (!) 145/84 (!) 159/79  Pulse: 78   90  Resp: 20   (!) 21  Temp: 98.6 F (37 C)  98.3 F (36.8 C) 98.7 F (37.1 C)  TempSrc: Oral  Oral Oral  SpO2: 93%  97% 97%  Weight:      Height:        Intake/Output Summary (Last 24 hours) at 04/11/2020 1410 Last data filed at 04/11/2020 0935 Gross per 24 hour  Intake 240 ml  Output 3050 ml  Net -2810 ml   Filed Weights   04/07/20 0221 04/09/20 0500 04/10/20 0500  Weight: 121 kg 119.7 kg 119.5 kg    Examination:  General exam: Appears calm and comfortable , nad Respiratory system: less rales, no wheezing Cardiovascular system: S1 & S2 heard, RRR. No JVD, murmurs, rubs, gallops or clicks.  Gastrointestinal system: Abdomen is nondistended, soft and nontender.. Normal bowel sounds heard. Central nervous system: Alert and oriented x3.  Grossly intact Extremities: No edema or cyanosos Skin: Warm dry Psychiatry: . Mood & affect appropriate in current  setting.     Data Reviewed: I have personally reviewed following labs and imaging studies  CBC: Recent Labs  Lab 04/07/20 0408 04/08/20 0534 04/09/20 0708 04/10/20 0500 04/11/20 0526  WBC 11.6* 12.5* 13.7* 13.5* 12.4*  NEUTROABS 9.0* 9.9* 11.6* 11.5* 9.5*  HGB 7.6* 7.2* 7.9* 7.4* 7.4*  HCT 25.6* 24.1* 26.5* 24.9* 24.4*  MCV 75.1* 75.3* 74.2* 75.2* 73.7*  PLT 278 253 239 259 875   Basic Metabolic Panel: Recent Labs  Lab 04/06/20 0411 04/06/20 0411 04/07/20 0408 04/08/20 0534 04/09/20 0708 04/10/20 0500 04/11/20 0526  NA 141  --  142 141 141 138  --   K 3.5  --  3.5 3.4* 3.8 3.8  --   CL 107  --  109 108 108 107  --   CO2 27  --  27 25 26 25   --   GLUCOSE 104*  --  82 92 104* 120*  --   BUN 19  --  14 11 10 10   --   CREATININE 0.94  --  1.10* 0.90 0.92 0.92  --   CALCIUM 8.2*  --  8.1* 8.2* 8.4* 8.5*  --   MG 1.9   < > 1.5* 1.6* 1.7 2.0 1.5*  PHOS 3.1   < > 3.1 3.8 3.6 3.8 4.0   < > = values in this interval not displayed.   GFR: Estimated Creatinine Clearance: 100.6 mL/min (by C-G formula based on SCr of 0.92 mg/dL).  Liver Function Tests: No results for input(s): AST, ALT, ALKPHOS, BILITOT, PROT, ALBUMIN in the last 168 hours. No results for input(s): LIPASE, AMYLASE in the last 168 hours. No results for input(s): AMMONIA in the last 168 hours. Coagulation Profile: No results for input(s): INR, PROTIME in the last 168 hours. Cardiac Enzymes: No results for input(s): CKTOTAL, CKMB, CKMBINDEX, TROPONINI in the last 168 hours. BNP (last 3 results) No results for input(s): PROBNP in the last 8760 hours. HbA1C: No results for input(s): HGBA1C in the last 72 hours. CBG: No results for input(s): GLUCAP in the last 168 hours. Lipid Profile: No results for input(s): CHOL, HDL, LDLCALC, TRIG, CHOLHDL, LDLDIRECT in the last 72 hours. Thyroid Function Tests: No results for input(s): TSH, T4TOTAL, FREET4, T3FREE, THYROIDAB in the last 72 hours. Anemia Panel: No  results for input(s): VITAMINB12, FOLATE, FERRITIN, TIBC, IRON, RETICCTPCT in the last 72 hours. Sepsis Labs: Recent Labs  Lab 04/08/20 0534  PROCALCITON 0.32    No results found for this or any previous visit (from the past 240 hour(s)).       Radiology Studies: No results found.      Scheduled Meds: . sodium chloride   Intravenous Once  . aerochamber plus with mask  1 each Other Once  . albuterol  1 puff Inhalation Q4H  . amLODipine  10 mg Oral Daily  . B-complex with vitamin C  1 tablet Oral Daily  . buprenorphine-naloxone  1 tablet Sublingual Daily  . Chlorhexidine Gluconate Cloth  6 each Topical Daily  . ciclesonide  4 puff Inhalation BID  . dexamethasone (DECADRON) injection  4 mg Intravenous Q24H  . feeding supplement (ENSURE ENLIVE)  237 mL Oral TID BM  . furosemide  40 mg Intravenous Daily  . ipratropium  2 puff Inhalation Q4H  . labetalol  300 mg Oral BID  . melatonin  5 mg Oral QHS  . pantoprazole  40 mg Oral Daily  . polyethylene glycol  17 g Oral Daily  . senna-docusate  2 tablet Oral BID  . sildenafil  20 mg Oral TID  . sodium chloride flush  10-40 mL Intracatheter Q12H  . sodium chloride flush  3 mL Intravenous Q12H   Continuous Infusions: . sodium chloride      Assessment & Plan:   Active Problems:   Renal failure   Acute hypoxemic respiratory failure (HCC)   Pneumonia due to COVID-19 virus   Iron deficiency anemia due to chronic blood loss  #1.  Acute hypoxemic respiratory failure secondary to COVID-19 pneumonia. Still requiring much o2, has been weaned down from 15L to 56L now.  Drops sats with ambulation and needs more 02l.   Echocardiogram showed a mild pulmonary hypertension with normal ejection fraction.  Patient had a completed a course of antibiotics for bacterial infection.  We will continue inhalers and steroids  pccm recommendation appreciated-we will continue Lasix and monitor output   2.  Severe iron deficiency  anemia. Required PRBC transfusion, also received IV iron. No evidence of active bleeding. Continue to monitor CBC., will transfuse if Hg <7 Hg 7.4 Will start on Feso4  3.  Acute kidney injury with ATN.  Secondary to Covid infection. Renal function has improved.  4.  Chronic diastolic congestive heart failure with mild pulmonary hypertension. Mildly volume overloaded. Good uo with lasix, will continue. Wean down 02 as tolerated keeping 02 sat >92%  5.  Morbid obesity.  6.  E. coli UTI. Antibiotics completed.  7.Hypomagensium- will replace  with iv mg 2g.x1 Monitor levels   DVT prophylaxis:SCDs Code Status:Full Family Communication:None Disposition Plan:SNF Barrier: Medically still not optimized, will need snf, case mx working on it. Need to wean down 02. Need to meet safe d/c planning . Likely few more days as she still requires iv steroids and iv lasix.         LOS: 16 days   Time spent:45 minutes with more than 50% on COC    Lynn Ito, MD [facility] Pager [phone]  If 7PM-7AM, please contact night-coverage [email or web address] Password [email or web address] 04/11/2020, 2:10 PM Patient ID: Diane Keller, female   DOB: 10-Nov-1976, 44 y.o.   MRN: [id number]

## 2020-04-11 NOTE — Progress Notes (Signed)
Pt in 7 L HFNC during assessment, sats 99%. Weaned pt to 2 L [place] sats 97% no s/s of distress

## 2020-04-12 DIAGNOSIS — E876 Hypokalemia: Secondary | ICD-10-CM

## 2020-04-12 LAB — MAGNESIUM: Magnesium: 1.8 mg/dL (ref 1.7–2.4)

## 2020-04-12 LAB — CBC WITH DIFFERENTIAL/PLATELET
Abs Immature Granulocytes: 0.29 10*3/uL — ABNORMAL HIGH (ref 0.00–0.07)
Basophils Absolute: 0.1 10*3/uL (ref 0.0–0.1)
Basophils Relative: 1 %
Eosinophils Absolute: 0.1 10*3/uL (ref 0.0–0.5)
Eosinophils Relative: 1 %
HCT: 25.5 % — ABNORMAL LOW (ref 36.0–46.0)
Hemoglobin: 7.8 g/dL — ABNORMAL LOW (ref 12.0–15.0)
Immature Granulocytes: 3 %
Lymphocytes Relative: 17 %
Lymphs Abs: 1.9 10*3/uL (ref 0.7–4.0)
MCH: 22.3 pg — ABNORMAL LOW (ref 26.0–34.0)
MCHC: 30.6 g/dL (ref 30.0–36.0)
MCV: 73.1 fL — ABNORMAL LOW (ref 80.0–100.0)
Monocytes Absolute: 0.7 10*3/uL (ref 0.1–1.0)
Monocytes Relative: 6 %
Neutro Abs: 8.3 10*3/uL — ABNORMAL HIGH (ref 1.7–7.7)
Neutrophils Relative %: 72 %
Platelets: 239 10*3/uL (ref 150–400)
RBC: 3.49 MIL/uL — ABNORMAL LOW (ref 3.87–5.11)
RDW: 24.6 % — ABNORMAL HIGH (ref 11.5–15.5)
Smear Review: NORMAL
WBC: 11.3 10*3/uL — ABNORMAL HIGH (ref 4.0–10.5)
nRBC: 0.2 % (ref 0.0–0.2)

## 2020-04-12 LAB — BASIC METABOLIC PANEL
Anion gap: 10 (ref 5–15)
BUN: 13 mg/dL (ref 6–20)
CO2: 25 mmol/L (ref 22–32)
Calcium: 8.5 mg/dL — ABNORMAL LOW (ref 8.9–10.3)
Chloride: 99 mmol/L (ref 98–111)
Creatinine, Ser: 0.86 mg/dL (ref 0.44–1.00)
GFR calc Af Amer: >60 mL/min (ref >60)
GFR calc non Af Amer: >60 mL/min (ref >60)
Glucose, Bld: 89 mg/dL (ref 70–99)
Potassium: 3.2 mmol/L — ABNORMAL LOW (ref 3.5–5.1)
Sodium: 134 mmol/L — ABNORMAL LOW (ref 135–145)

## 2020-04-12 LAB — PHOSPHORUS: Phosphorus: 3.6 mg/dL (ref 2.5–4.6)

## 2020-04-12 MED ORDER — POTASSIUM CHLORIDE 10 MEQ/100ML IV SOLN: 10.0000 meq | INTRAVENOUS | Status: DC

## 2020-04-12 MED ORDER — POTASSIUM CHLORIDE CRYS ER 20 MEQ PO TBCR
40.0000 meq | EXTENDED_RELEASE_TABLET | Freq: Once | ORAL | Status: AC
  Administered 2020-04-12: 40 meq via ORAL
  Filled 2020-04-12: qty 2

## 2020-04-12 MED ORDER — OXYCODONE HCL 5 MG PO TABS
5.0000 mg | ORAL_TABLET | Freq: Four times a day (QID) | ORAL | Status: DC | PRN
  Administered 2020-04-12 – 2020-04-15 (×4): 5 mg via ORAL
  Filled 2020-04-12 (×4): qty 1

## 2020-04-12 MED ORDER — POTASSIUM CHLORIDE CRYS ER 20 MEQ PO TBCR
40.0000 meq | EXTENDED_RELEASE_TABLET | Freq: Once | ORAL | Status: AC
  Administered 2020-04-12: 13:00:00 40 meq via ORAL
  Filled 2020-04-12: qty 2

## 2020-04-12 NOTE — Progress Notes (Signed)
Notified MD Lasix giving K 3.2

## 2020-04-12 NOTE — Progress Notes (Signed)
PROGRESS NOTE    Diane Keller  AYT:[id number] DOB: 18-Sep-1976 DOA: 03/26/2020 PCP: Malva Limes, MD    Brief Narrative:  Patient is a 44 year old female with history of acid reflex and history of stroke with residual left-sided weakness who present to the emergency room on 5/13 with generalized weakness and a fever. She was hypotensive upon arrival. She was diagnosed with severe COVID-19 Pneumonia, with secondary bacterial pneumonia, acute kidney injury and hypovolemic shock,and acute hypoxemic respiratory failure. Patient has completed  remdesivir. Patient never required hemodialysis. Did not require intubation.  Patient was transferred to stepdown unit from ICU on 5/24.    Consultants:   Nephrology, PCCM, cardiology  Procedures:  Antimicrobials:   None   Subjective: Down to 5L today while I am in the room. States breathing is better and less cough  Objective: Vitals:   04/11/20 0935 04/12/20 0017 04/12/20 0819 04/12/20 0822  BP: (!) 159/79 (!) 152/84 (!) 162/79   Pulse: 90 78 86   Resp: (!) 21 (!) 21 (!) 28 (!) 24  Temp: 98.7 F (37.1 C) 99.1 F (37.3 C) 98.7 F (37.1 C)   TempSrc: Oral Oral Oral   SpO2: 97% 97% 97%   Weight:      Height:        Intake/Output Summary (Last 24 hours) at 04/12/2020 1105 Last data filed at 04/12/2020 3557 Gross per 24 hour  Intake 0 ml  Output 1600 ml  Net -1600 ml   Filed Weights   04/07/20 0221 04/09/20 0500 04/10/20 0500  Weight: 121 kg 119.7 kg 119.5 kg    Examination:  General exam: Appears calm and comfortable , nad  Respiratory system: scattered bibasilar rales, no wheezing Cardiovascular system: S1 & S2 heard, RRR. No JVD, murmurs, rubs, gallops or clicks.  Gastrointestinal system: Abdomen is nondistended, soft and nontender.. Normal bowel sounds heard. Central nervous system: Alert and oriented x3.  Grossly intact Extremities: trace edema b/l Skin: Warm dry Psychiatry: . Mood & affect appropriate in  current setting.     Data Reviewed: I have personally reviewed following labs and imaging studies  CBC: Recent Labs  Lab 04/08/20 0534 04/09/20 0708 04/10/20 0500 04/11/20 0526 04/12/20 0500  WBC 12.5* 13.7* 13.5* 12.4* 11.3*  NEUTROABS 9.9* 11.6* 11.5* 9.5* 8.3*  HGB 7.2* 7.9* 7.4* 7.4* 7.8*  HCT 24.1* 26.5* 24.9* 24.4* 25.5*  MCV 75.3* 74.2* 75.2* 73.7* 73.1*  PLT 253 239 259 262 239   Basic Metabolic Panel: Recent Labs  Lab 04/07/20 0408 04/07/20 0408 04/08/20 0534 04/09/20 0708 04/10/20 0500 04/11/20 0526 04/12/20 0500  NA 142  --  141 141 138  --  134*  K 3.5  --  3.4* 3.8 3.8  --  3.2*  CL 109  --  108 108 107  --  99  CO2 27  --  25 26 25   --  25  GLUCOSE 82  --  92 104* 120*  --  89  BUN 14  --  11 10 10   --  13  CREATININE 1.10*  --  0.90 0.92 0.92  --  0.86  CALCIUM 8.1*  --  8.2* 8.4* 8.5*  --  8.5*  MG 1.5*   < > 1.6* 1.7 2.0 1.5* 1.8  PHOS 3.1   < > 3.8 3.6 3.8 4.0 3.6   < > = values in this interval not displayed.   GFR: Estimated Creatinine Clearance: 107.6 mL/min (by C-G formula based on SCr of 0.86 mg/dL).  Liver Function Tests: No results for input(s): AST, ALT, ALKPHOS, BILITOT, PROT, ALBUMIN in the last 168 hours. No results for input(s): LIPASE, AMYLASE in the last 168 hours. No results for input(s): AMMONIA in the last 168 hours. Coagulation Profile: No results for input(s): INR, PROTIME in the last 168 hours. Cardiac Enzymes: No results for input(s): CKTOTAL, CKMB, CKMBINDEX, TROPONINI in the last 168 hours. BNP (last 3 results) No results for input(s): PROBNP in the last 8760 hours. HbA1C: No results for input(s): HGBA1C in the last 72 hours. CBG: No results for input(s): GLUCAP in the last 168 hours. Lipid Profile: No results for input(s): CHOL, HDL, LDLCALC, TRIG, CHOLHDL, LDLDIRECT in the last 72 hours. Thyroid Function Tests: No results for input(s): TSH, T4TOTAL, FREET4, T3FREE, THYROIDAB in the last 72 hours. Anemia  Panel: No results for input(s): VITAMINB12, FOLATE, FERRITIN, TIBC, IRON, RETICCTPCT in the last 72 hours. Sepsis Labs: Recent Labs  Lab 04/08/20 0534  PROCALCITON 0.32    No results found for this or any previous visit (from the past 240 hour(s)).       Radiology Studies: No results found.      Scheduled Meds: . sodium chloride   Intravenous Once  . aerochamber plus with mask  1 each Other Once  . albuterol  1 puff Inhalation Q4H  . amLODipine  10 mg Oral Daily  . B-complex with vitamin C  1 tablet Oral Daily  . buprenorphine-naloxone  1 tablet Sublingual Daily  . Chlorhexidine Gluconate Cloth  6 each Topical Daily  . ciclesonide  4 puff Inhalation BID  . dexamethasone (DECADRON) injection  4 mg Intravenous Q24H  . feeding supplement (ENSURE ENLIVE)  237 mL Oral TID BM  . furosemide  40 mg Intravenous Daily  . ipratropium  2 puff Inhalation Q4H  . labetalol  300 mg Oral BID  . melatonin  5 mg Oral QHS  . pantoprazole  40 mg Oral Daily  . polyethylene glycol  17 g Oral Daily  . potassium chloride  40 mEq Oral Once  . senna-docusate  2 tablet Oral BID  . sildenafil  20 mg Oral TID  . sodium chloride flush  10-40 mL Intracatheter Q12H  . sodium chloride flush  3 mL Intravenous Q12H   Continuous Infusions: . sodium chloride      Assessment & Plan:   Active Problems:   Renal failure   Acute hypoxemic respiratory failure (HCC)   Pneumonia due to COVID-19 virus   Iron deficiency anemia due to chronic blood loss  #1.  Acute hypoxemic respiratory failure secondary to COVID-19 pneumonia. Still requiring much o2, has been weaned down from 15L to 56L now.  Drops sats with ambulation and needs more 02l.   Echocardiogram showed a mild pulmonary hypertension with normal ejection fraction.  Patient had a completed a course of antibiotics for bacterial infection.  We will continue inhalers and steroids  pccm following we will continue Lasix    2.  Severe iron  deficiency anemia. Required PRBC transfusion, also received IV iron. No evidence of active bleeding. Continue to monitor CBC., will transfuse if Hg <7 Hg 7.4 started on Feso4  3.  Acute kidney injury with ATN.  Secondary to Covid infection. Renal function has improved.  4.  Chronic diastolic congestive heart failure with mild pulmonary hypertension. Mildly volume overloaded. Good uo with lasix, will continue. Wean down 02 as tolerated keeping 02 sat >92%  5.  Morbid obesity.  6.  E. coli UTI.  Antibiotics completed.  7.Hypomagensium-replaced, magnesium 1.8 today  8.  Hypokalemia-likely from diuresis K is 3.2 Replace with potassium 40 mEq p.o. twice daily Monitor levels   DVT prophylaxis:SCDs Code Status:Full Family Communication:None Disposition Plan:SNF Barrier: Medically still not optimized, will need snf, case mx working on it. Need to wean down 02. Need to meet safe d/c planning . Likely few more days as she still requires iv steroids and iv lasix.         LOS: 17 days   Time spent:45 minutes with more than 50% on [place], MD [facility] Pager [phone]  If 7PM-7AM, please contact night-coverage [email or web address] Password [email or web address] 04/12/2020, 11:05 AM Patient ID: Diane Keller, female   DOB: August 05, 1976, 44 y.o.   MRN: [id number]

## 2020-04-12 NOTE — Progress Notes (Signed)
Pulmonary Medicine          Date: 04/12/2020,   MRN# [id number] Diane Keller 09-24-1976     AdmissionWeight: 116.3 kg                 CurrentWeight: 119.5 kg   Referring physician: Dr. Kurtis Bushman   CHIEF COMPLAINT:   Acute hypoxemic respiratory failure due to COVID-19 pneumonia   SUBJECTIVE   44 y.o.femalewith a history of GERD and prior stroke with residual left-sided motor deficit who is brought to the ED due to fever and generalized weakness.EMS described the patient is having hypotension prior to arrival, but lowest blood pressure they observed was 95/70. Patient is having lightheadedness nausea and malaise as well as shortness of breath and cough. Admitted with COVID-19 pneumonia, acute oxyntic respiratory failure due to COVID-19, acute renal failure and anemia.    5/14 admitted for COVID pneumonia, anemia and Renal failure 5/148L [place], worsening renal function 5/15 very good UOP, 1500 cc's 5/16 good UOP 1200CC's 5/17- patient had episode of respiratory distress with hypoxemia, we discussed this with daughter, high risk for intubation 5/18-  Patient is hemodynamically improved.  She is with confusion and intermittent lethargy.   04/01/20- patient still with confusion, FIO2 requiremnt has improved on 60% 35L/min 04/02/20- patient remains with intermittent confusion, she seems to have component of icu delerium and possible metabolic encephalopathy 2/68/34- mental status clearing.  Started on sildenafil for persistent hypoxemia,in the setting of COVID-19 and evidence of pulmonary hypertension on 2D echo.  FiO2 requirements to 80% with a flow rate of 55 L 04/04/20- inflammatory markers rising started back on low-dose dexamethasone.  O2 and flow requirements decreasing since starting sildenafil.  FiO2 to 70% flow to 45 L. 04/05/20- wean down to 8 L bubble high flow.  Comfortable.  Received another unit of PRBCs.  Mental status clear.  04/12/20- patient states she is feeling  better. She is only able to take 300cc tidal volumes on incentive spirometry    PAST MEDICAL HISTORY   Past Medical History:  Diagnosis Date  . Anxiety   . GERD (gastroesophageal reflux disease)   . Stroke [facility] 2018     SURGICAL HISTORY   Past Surgical History:  Procedure Laterality Date  . CESAREAN SECTION       FAMILY HISTORY   Family History  Problem Relation Age of Onset  . Hypertension Father   . Heart attack Father   . Cerebrovascular Accident Father      SOCIAL HISTORY   Social History   Tobacco Use  . Smoking status: Former Smoker    Types: Cigarettes  . Smokeless tobacco: Never Used  Substance Use Topics  . Alcohol use: Yes  . Drug use: No     MEDICATIONS    Home Medication:    Current Medication:  Current Facility-Administered Medications:  .  0.9 %  sodium chloride infusion (Manually program via Guardrails IV Fluids), , Intravenous, Once, Bradly Bienenstock, NP, Stopped at 03/27/20 1632 .  0.9 %  sodium chloride infusion, 250 mL, Intravenous, PRN, Flora Lipps, MD .  acetaminophen (TYLENOL) tablet 650 mg, 650 mg, Oral, Q4H PRN, Flora Lipps, MD, 650 mg at 04/10/20 2116 .  aerochamber plus with mask device 1 each, 1 each, Other, Once, Flora Lipps, MD, Stopped at 03/26/20 2214 .  albuterol (VENTOLIN HFA) 108 (90 Base) MCG/ACT inhaler 1 puff, 1 puff, Inhalation, Q4H, Flora Lipps, MD, 1 puff at 04/12/20 1733 .  alum &  mag hydroxide-simeth (MAALOX/MYLANTA) 200-200-20 MG/5ML suspension 30 mL, 30 mL, Oral, Q4H PRN, Manuela Schwartz, NP, 30 mL at 04/11/20 0932 .  amLODipine (NORVASC) tablet 10 mg, 10 mg, Oral, Daily, Lynn Ito, MD, 10 mg at 04/12/20 0927 .  B-complex with vitamin C tablet 1 tablet, 1 tablet, Oral, Daily, Erin Fulling, MD, 1 tablet at 04/12/20 0926 .  buprenorphine-naloxone (SUBOXONE) 8-2 mg per SL tablet 1 tablet, 1 tablet, Sublingual, Daily, Erin Fulling, MD, 1 tablet at 04/12/20 0926 .  Chlorhexidine Gluconate Cloth 2 % PADS 6  each, 6 each, Topical, Daily, Erin Fulling, MD, 6 each at 04/12/20 1411 .  ciclesonide (ALVESCO) 160 MCG/ACT inhaler 4 puff, 4 puff, Inhalation, BID, Erin Fulling, MD, 4 puff at 04/11/20 2033 .  dexamethasone (DECADRON) injection 4 mg, 4 mg, Intravenous, Q24H, Salena Saner, MD, 4 mg at 04/12/20 0926 .  docusate sodium (COLACE) capsule 100 mg, 100 mg, Oral, BID PRN, Erin Fulling, MD, 100 mg at 04/05/20 0745 .  feeding supplement (ENSURE ENLIVE) (ENSURE ENLIVE) liquid 237 mL, 237 mL, Oral, TID BM, Kasa, Kurian, MD, 237 mL at 04/11/20 2034 .  furosemide (LASIX) injection 40 mg, 40 mg, Intravenous, Daily, Vida Rigger, MD, 40 mg at 04/12/20 0925 .  ipratropium (ATROVENT HFA) inhaler 2 puff, 2 puff, Inhalation, Q4H, Erin Fulling, MD, 2 puff at 04/12/20 1734 .  labetalol (NORMODYNE) injection 10 mg, 10 mg, Intravenous, Q2H PRN, Harlon Ditty D, NP, 10 mg at 04/10/20 1006 .  labetalol (NORMODYNE) tablet 300 mg, 300 mg, Oral, BID, Kolluru, Sarath, MD, 300 mg at 04/12/20 0926 .  lip balm (BLISTEX) ointment, , Topical, PRN, Harlon Ditty D, NP .  melatonin tablet 5 mg, 5 mg, Oral, QHS, Manuela Schwartz, NP, 5 mg at 04/11/20 2033 .  ondansetron (ZOFRAN) injection 4 mg, 4 mg, Intravenous, Q6H PRN, Erin Fulling, MD, 4 mg at 04/02/20 0909 .  oxyCODONE (Oxy IR/ROXICODONE) immediate release tablet 5 mg, 5 mg, Oral, Q6H PRN, Jimmye Norman, NP, 5 mg at 04/12/20 0556 .  pantoprazole (PROTONIX) EC tablet 40 mg, 40 mg, Oral, Daily, Manuela Schwartz, NP, 40 mg at 04/12/20 1610 .  polyethylene glycol (MIRALAX / GLYCOLAX) packet 17 g, 17 g, Oral, Daily, Cherly Hensen, RPH, 17 g at 04/07/20 1036 .  senna-docusate (Senokot-S) tablet 2 tablet, 2 tablet, Oral, BID, Cherly Hensen, [facility], 2 tablet at 04/12/20 9604 .  sildenafil (REVATIO) tablet 20 mg, 20 mg, Oral, TID, Salena Saner, MD, 20 mg at 04/12/20 1734 .  sodium chloride flush (NS) 0.9 % injection 10-40 mL, 10-40 mL, Intracatheter,  Q12H, Kasa, Kurian, MD, 10 mL at 04/12/20 0924 .  sodium chloride flush (NS) 0.9 % injection 10-40 mL, 10-40 mL, Intracatheter, PRN, Belia Heman, Kurian, MD .  sodium chloride flush (NS) 0.9 % injection 3 mL, 3 mL, Intravenous, Q12H, Kasa, Kurian, MD, 3 mL at 04/12/20 0924 .  sodium chloride flush (NS) 0.9 % injection 3 mL, 3 mL, Intravenous, PRN, Erin Fulling, MD .  traZODone (DESYREL) tablet 25 mg, 25 mg, Oral, QHS PRN, Mansy, Jan A, MD, 25 mg at 04/10/20 2127    ALLERGIES   Augmentin [amoxicillin-pot clavulanate] and Lisinopril     REVIEW OF SYSTEMS    Review of Systems:  Gen:  Denies  fever, sweats, chills weigh loss  HEENT: Denies blurred vision, double vision, ear pain, eye pain, hearing loss, nose bleeds, sore throat Cardiac:  No dizziness, chest pain or heaviness, chest tightness,edema Resp:   Denies cough  or sputum porduction, shortness of breath,wheezing, hemoptysis,  Gi: Denies swallowing difficulty, stomach pain, nausea or vomiting, diarrhea, constipation, bowel incontinence Gu:  Denies bladder incontinence, burning urine Ext:   Denies Joint pain, stiffness or swelling Skin: Denies  skin rash, easy bruising or bleeding or hives Endoc:  Denies polyuria, polydipsia , polyphagia or weight change Psych:   Denies depression, insomnia or hallucinations   Other:  All other systems negative   VS: BP (!) 162/79 (BP Location: Left Arm)   Pulse 86   Temp 98.7 F (37.1 C) (Oral)   Resp (!) 24   Ht  (1.676 m)   Wt 119.5 kg   SpO2 97%   BMI 42.52 kg/m      PHYSICAL EXAM    GENERAL:NAD, no fevers, chills, no weakness no fatigue HEAD: Normocephalic, atraumatic.  EYES: Pupils equal, round, reactive to light. Extraocular muscles intact. No scleral icterus.  MOUTH: Moist mucosal membrane. Dentition intact. No abscess noted.  EAR, NOSE, THROAT: Clear without exudates. No external lesions.  NECK: Supple. No thyromegaly. No nodules. No JVD.  PULMONARY: bilateral  rhonchorous breath sounds CARDIOVASCULAR: S1 and S2. Regular rate and rhythm. No murmurs, rubs, or gallops. No edema. Pedal pulses 2+ bilaterally.  GASTROINTESTINAL: Soft, nontender, nondistended. No masses. Positive bowel sounds. No hepatosplenomegaly.  MUSCULOSKELETAL: No swelling, clubbing, or edema. Range of motion full in all extremities.  NEUROLOGIC: Cranial nerves II through XII are intact. No gross focal neurological deficits. Sensation intact. Reflexes intact.  SKIN: No ulceration, lesions, rashes, or cyanosis. Skin warm and dry. Turgor intact.  PSYCHIATRIC: Mood, affect within normal limits. The patient is awake, alert and oriented x 3. Insight, judgment intact.       IMAGING    DG Abdomen 1 View  Result Date: 03/26/2020 CLINICAL DATA:  Shortness of breath.  Abdominal mass. EXAM: ABDOMEN - 1 VIEW COMPARISON:  None FINDINGS: Normal amount of gas within the intestine. No definite soft tissue mass by radiography. Some potential for increased density projected over the left pelvis, not a reliable finding. No abnormal calcifications or significant bone findings. IMPRESSION: Gas pattern within normal limits. Possible increased density of the left pelvis that could represent a mass. Electronically Signed   By: Paulina Fusi M.D.   On: 03/26/2020 13:44   CT HEAD WO CONTRAST  Result Date: 04/07/2020 CLINICAL DATA:  Altered mental status, COVID-19 pneumonia EXAM: CT HEAD WITHOUT CONTRAST TECHNIQUE: Contiguous axial images were obtained from the base of the skull through the vertex without intravenous contrast. COMPARISON:  04/02/2020 FINDINGS: Brain: There is no acute intracranial hemorrhage, mass effect, or edema. There is no new loss of gray-white differentiation. Small area of encephalomalacia is again identified in the region the right lentiform nucleus. There is no extra-axial fluid collection. Ventricles and sulci are within normal limits in size and configuration. Vascular: No hyperdense  vessel or unexpected calcification. Skull: Calvarium is unremarkable. Sinuses/Orbits: Nonspecific partial ethmoid and sphenoid sinus opacification layering secretions. Visualized orbits are unremarkable. Other: Mastoid air cells are clear. IMPRESSION: No acute intracranial abnormality. No significant change since 04/02/2020. Electronically Signed   By: Guadlupe Spanish M.D.   On: 04/07/2020 16:19   CT HEAD WO CONTRAST  Result Date: 04/02/2020 CLINICAL DATA:  44 year old female with COVID-19. Pneumonia. Delirium, confusion, hypoxia. EXAM: CT HEAD WITHOUT CONTRAST TECHNIQUE: Contiguous axial images were obtained from the base of the skull through the vertex without intravenous contrast. COMPARISON:  Brain MRI and intracranial MRA 05/11/2017. Head CT 05/10/2017. FINDINGS: Brain:  Bulky chronic dural calcifications again noted. 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 Underlying cerebral volume remains normal. No midline shift, ventriculomegaly, mass effect, evidence of mass lesion, intracranial hemorrhage or evidence of cortically based acute infarction. No other encephalomalacia identified. Vascular: No suspicious intracranial vascular hyperdensity. Skull: No acute osseous abnormality identified. Sinuses/Orbits: Widespread bilateral paranasal sinus fluid and bubbly opacity although improved sinus aeration compared to the 2018 exams. Hyperplastic sphenoid wing, petrous apex and mastoid air cells remain well pneumatized. There is new minimal to mild left side mastoid effusion. Other: Negative orbit and scalp soft tissues. IMPRESSION: 1. 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 2. No new intracranial abnormality. 3. Widespread paranasal sinus inflammation, although improved compared to 2018. Electronically Signed   By: Odessa Fleming M.D.   On: 04/02/2020 18:22   US RENAL  Result Date:  03/27/2020 CLINICAL DATA:  Acute tubular necrosis EXAM: RENAL / URINARY TRACT ULTRASOUND COMPLETE COMPARISON:  None. FINDINGS: Right Kidney: Renal measurements: 13 x 5 x 6 cm = volume: 214 mL. Increased echogenicity. No hydronephrosis or mass Left Kidney: Renal measurements: 14 x 7.4 x 6 cm = volume: 325 mL. Increased echogenicity. No hydronephrosis or mass Bladder: Moderate distension of the bladder despite a Foley catheter. No focal finding. Other: Cholelithiasis is incidentally noted.  No wall thickening. IMPRESSION: 1. 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 If renal failure is acute, inflammatory nephropathy would also be considered. 2. Cholelithiasis. 3. Moderate distension of the bladder despite a Foley catheter, please ensure the catheter is functional. Electronically Signed   By: Marnee Spring M.D.   On: 03/27/2020 05:27   US Venous Img Lower Bilateral (DVT)  Result Date: 03/27/2020 CLINICAL DATA:  Acute respiratory failure with hypoxia EXAM: BILATERAL LOWER EXTREMITY VENOUS DOPPLER ULTRASOUND TECHNIQUE: Gray-scale sonography with compression, as well as color and duplex ultrasound, were performed to evaluate the deep venous system(s) from the level of the common femoral vein through the popliteal and proximal calf veins. COMPARISON:  None. FINDINGS: VENOUS Normal compressibility of the common femoral, superficial femoral, and popliteal veins, as well as the visualized calf veins. Visualized portions of profunda femoral vein and great saphenous vein unremarkable. No filling defects to suggest DVT on grayscale or color Doppler imaging. Doppler waveforms show normal direction of venous flow, normal respiratory plasticity and response to augmentation. Limited views of the contralateral common femoral vein are unremarkable. IMPRESSION: Negative. Electronically Signed   By: Marnee Spring M.D.   On: 03/27/2020 05:07   DG Chest Port 1 View  Result  Date: 04/07/2020 CLINICAL DATA:  Hypoxia.  COVID-19 positive EXAM: PORTABLE CHEST 1 VIEW COMPARISON:  Apr 01, 2020 FINDINGS: Right peripherally inserted central catheter crosses from the right side with tip in left innominate vein. No pneumothorax. There is airspace opacity throughout much of the right upper lobe as well as in the left upper lobe and left base regions. A lesser degree of opacity is noted in the right base region. There is no appreciable adenopathy. Heart is upper normal in size with pulmonary vascularity within normal limits. No bone lesions. IMPRESSION: Multifocal airspace opacity, significantly more than on most recent study consistent with multifocal pneumonia. Right peripherally inserted central catheter has its tip in the left innominate vein. No pneumothorax. Electronically Signed   By: Bretta Bang III M.D.   On: 04/07/2020 13:20   DG Chest  Port 1 View  Result Date: 04/01/2020 CLINICAL DATA:  Acute respiratory failure.  COVID-19 positive. EXAM: PORTABLE CHEST 1 VIEW COMPARISON:  03/30/2020 FINDINGS: Right-sided PICC line has tip overlying the SVC. Lungs are hypoinflated with persistent patchy bilateral airspace opacification with slight overall improvement compatible with known multifocal pneumonia. Suggestion of a small right pleural effusion with associated basilar atelectasis which is new. Cardiomediastinal silhouette and remainder of the exam is unchanged. IMPRESSION: 1. Hypoinflation with persistent multifocal airspace process with slight overall improvement likely multifocal pneumonia. New small right pleural effusion with mild associated basilar atelectasis. 2.  Right-sided PICC line with tip over the SVC. Electronically Signed   By: Elberta Fortis M.D.   On: 04/01/2020 07:15   DG Chest Port 1 View  Result Date: 03/30/2020 CLINICAL DATA:  Patient with vomiting. Increased oxygen requirement. EXAM: PORTABLE CHEST 1 VIEW COMPARISON:  Chest radiograph 03/30/2020. FINDINGS:  Monitoring leads overlie the patient. Stable cardiomegaly. Similar-appearing bilateral patchy areas of consolidation. Elevation right hemidiaphragm. Probable small left pleural effusion. No pneumothorax. IMPRESSION: Similar-appearing bilateral airspace opacities. Probable small left pleural effusion. Electronically Signed   By: Annia Belt M.D.   On: 03/30/2020 14:37   DG Chest Port 1 View  Result Date: 03/30/2020 CLINICAL DATA:  Shortness of breath EXAM: PORTABLE CHEST 1 VIEW COMPARISON:  03/27/2020 FINDINGS: Persistent bilateral opacities. Slightly decreased left mid lung consolidation. There is otherwise overall similar lung aeration. No significant pleural effusion. No pneumothorax. Stable cardiomediastinal contours. IMPRESSION: Persistent bilateral opacities with slightly decreased left mid lung consolidation. Otherwise similar lung aeration compared to 03/27/2020. Electronically Signed   By: Guadlupe Spanish M.D.   On: 03/30/2020 09:05   DG Chest Port 1 View  Result Date: 03/27/2020 CLINICAL DATA:  Short of breath, COVID-19 positive EXAM: PORTABLE CHEST 1 VIEW COMPARISON:  03/26/2020 FINDINGS: Single frontal view of the chest demonstrates progressive bilateral multifocal airspace disease consistent with pneumonia. No effusion or pneumothorax. Cardiac silhouette is stable. IMPRESSION: 1. Progressive multifocal bilateral pneumonia. Electronically Signed   By: Sharlet Salina M.D.   On: 03/27/2020 01:12   DG Chest Portable 1 View  Result Date: 03/26/2020 CLINICAL DATA:  Shortness of breath and hypoxia. Abdominal mass. Fever and weakness. EXAM: PORTABLE CHEST 1 VIEW COMPARISON:  04/23/2012 FINDINGS: Cardiomegaly. Mediastinal shadows are normal. Areas of patchy bilateral pulmonary density that could represent patchy bronchopneumonia. An element of edema is possible. No visible effusion. No significant bone finding. IMPRESSION: Patchy bilateral pulmonary densities most consistent with pneumonia. Possible  that there could be an element of edema. Electronically Signed   By: Paulina Fusi M.D.   On: 03/26/2020 13:43   ECHOCARDIOGRAM COMPLETE BUBBLE STUDY  Result Date: 03/27/2020    ECHOCARDIOGRAM REPORT   Patient Name:   Diane Keller Date of Exam: 03/27/2020 Medical Rec #:  [id number]        Height:       66.0 in Accession #:    [id number]       Weight:       256.4 lb Date of Birth:  [birth year]         BSA:          2.222 m Patient Age:    44 years         BP:           104/74 mmHg Patient Gender: F                HR:  81 bpm. Exam Location:  [facility] Procedure: 2D Echo, Color Doppler and Cardiac Doppler Indications:     I63.9 Stroke  History:         Patient has prior history of Echocardiogram examinations. Pt                  tested positive for covid-19 on 03/26/2020.  Sonographer:     Humphrey Rolls RDCS (AE) Referring Phys:  [id number] Erin Fulling Diagnosing Phys: Julien Nordmann MD IMPRESSIONS  1. Left ventricular ejection fraction, by estimation, is 60 to 65%. The left ventricle has normal function. The left ventricle has no regional wall motion abnormalities. There is mild left ventricular hypertrophy. Left ventricular diastolic parameters are consistent with Grade I diastolic dysfunction (impaired relaxation).  2. Right ventricular systolic function is normal. The right ventricular size is normal. There is mildly elevated pulmonary artery systolic pressure.  3. Mild mitral valve regurgitation.  4. Agitated saline contrast bubble study was negative, with no evidence of any interatrial shunt. FINDINGS  Left Ventricle: Left ventricular ejection fraction, by estimation, is 60 to 65%. The left ventricle has normal function. The left ventricle has no regional wall motion abnormalities. The left ventricular internal cavity size was normal in size. There is  mild left ventricular hypertrophy. Left ventricular diastolic parameters are consistent with Grade I diastolic dysfunction (impaired relaxation). Right Ventricle:  The right ventricular size is normal. No increase in right ventricular wall thickness. Right ventricular systolic function is normal. There is mildly elevated pulmonary artery systolic pressure. The tricuspid regurgitant velocity is 3.03  m/s, and with an assumed right atrial pressure of 5 mmHg, the estimated right ventricular systolic pressure is 41.7 mmHg. Left Atrium: Left atrial size was normal in size. Right Atrium: Right atrial size was normal in size. Pericardium: There is no evidence of pericardial effusion. Mitral Valve: The mitral valve is normal in structure. Normal mobility of the mitral valve leaflets. Mild mitral valve regurgitation. No evidence of mitral valve stenosis. MV peak gradient, 4.2 mmHg. The mean mitral valve gradient is 2.0 mmHg. Tricuspid Valve: The tricuspid valve is normal in structure. Tricuspid valve regurgitation is mild . No evidence of tricuspid stenosis. Aortic Valve: The aortic valve is normal in structure. Aortic valve regurgitation is not visualized. No aortic stenosis is present. Aortic valve mean gradient measures 5.0 mmHg. Aortic valve peak gradient measures 11.8 mmHg. Aortic valve area, by VTI measures 3.00 cm. Pulmonic Valve: The pulmonic valve was normal in structure. Pulmonic valve regurgitation is trivial. No evidence of pulmonic stenosis. Aorta: The aortic root is normal in size and structure. Venous: The inferior vena cava is normal in size with greater than 50% respiratory variability, suggesting right atrial pressure of 3 mmHg. IAS/Shunts: No atrial level shunt detected by color flow Doppler. Agitated saline contrast was given intravenously to evaluate for intracardiac shunting. Agitated saline contrast bubble study was negative, with no evidence of any interatrial shunt.  LEFT VENTRICLE PLAX 2D LVIDd:         4.70 cm  Diastology LVIDs:         2.72 cm  LV e' lateral:   6.31 cm/s LV PW:         1.28 cm  LV E/e' lateral: 12.8 LV IVS:        0.92 cm  LV e' medial:     5.66 cm/s LVOT diam:     2.10 cm  LV E/e' medial:  14.3 LV SV:  88 LV SV Index:   39 LVOT Area:     3.46 cm  RIGHT VENTRICLE RV Basal diam:  2.69 cm LEFT ATRIUM             Index       RIGHT ATRIUM           Index LA diam:        3.80 cm 1.71 cm/m  RA Area:     17.20 cm LA Vol (A2C):   75.5 ml 33.97 ml/m RA Volume:   42.10 ml  18.94 ml/m LA Vol (A4C):   47.6 ml 21.42 ml/m LA Biplane Vol: 61.5 ml 27.67 ml/m  AORTIC VALVE                    PULMONIC VALVE AV Area (Vmax):    2.66 cm     PV Vmax:       1.46 m/s AV Area (Vmean):   2.94 cm     PV Vmean:      101.000 cm/s AV Area (VTI):     3.00 cm     PV VTI:        0.287 m AV Vmax:           172.00 cm/s  PV Peak grad:  8.5 mmHg AV Vmean:          103.000 cm/s PV Mean grad:  5.0 mmHg AV VTI:            0.292 m AV Peak Grad:      11.8 mmHg AV Mean Grad:      5.0 mmHg LVOT Vmax:         132.00 cm/s LVOT Vmean:        87.500 cm/s LVOT VTI:          0.253 m LVOT/AV VTI ratio: 0.87  AORTA Ao Root diam: 3.30 cm MITRAL VALVE                TRICUSPID VALVE MV Area (PHT): 3.77 cm     TR Peak grad:   36.7 mmHg MV Peak grad:  4.2 mmHg     TR Vmax:        303.00 cm/s MV Mean grad:  2.0 mmHg MV Vmax:       1.02 m/s     SHUNTS MV Vmean:      69.8 cm/s    Systemic VTI:  0.25 m MV Decel Time: 201 msec     Systemic Diam: 2.10 cm MV E velocity: 81.00 cm/s MV A velocity: 108.00 cm/s MV E/A ratio:  0.75 Julien Nordmannimothy Gollan MD Electronically signed by Julien Nordmannimothy Gollan MD Signature Date/Time: 03/27/2020/1:53:37 PM    Final    US EKG SITE RITE  Result Date: 03/30/2020 If Site Rite image not attached, placement could not be confirmed due to current cardiac rhythm.     ASSESSMENT/PLAN   Acute hypoxemic respiratory failure  - due to COVID19   - s/p full course of therapy with Remdesevir   - continue current care   -continue lasix - patient is now net 1L net negative   -fungitell to rule out PCP  UTI  due to ecoli >100K colonies  -completed abx        Thank you  for allowing me to participate in the care of this patient.   Patient/Family are satisfied with care plan and all questions have been answered.  This document was prepared using Conservation officer, historic buildingsDragon voice recognition software and  may include unintentional dictation errors.     Ottie Glazier, M.D.  Division of [facility]

## 2020-04-13 ENCOUNTER — Inpatient Hospital Stay: Payer: Medicaid Other

## 2020-04-13 LAB — CBC WITH DIFFERENTIAL/PLATELET
Abs Immature Granulocytes: 0.17 10*3/uL — ABNORMAL HIGH (ref 0.00–0.07)
Basophils Absolute: 0.1 10*3/uL (ref 0.0–0.1)
Basophils Relative: 1 %
Eosinophils Absolute: 0.1 10*3/uL (ref 0.0–0.5)
Eosinophils Relative: 1 %
HCT: 25.1 % — ABNORMAL LOW (ref 36.0–46.0)
Hemoglobin: 7.8 g/dL — ABNORMAL LOW (ref 12.0–15.0)
Immature Granulocytes: 2 %
Lymphocytes Relative: 15 %
Lymphs Abs: 1.6 10*3/uL (ref 0.7–4.0)
MCH: 23.1 pg — ABNORMAL LOW (ref 26.0–34.0)
MCHC: 31.1 g/dL (ref 30.0–36.0)
MCV: 74.5 fL — ABNORMAL LOW (ref 80.0–100.0)
Monocytes Absolute: 0.8 10*3/uL (ref 0.1–1.0)
Monocytes Relative: 7 %
Neutro Abs: 7.6 10*3/uL (ref 1.7–7.7)
Neutrophils Relative %: 74 %
Platelets: 210 10*3/uL (ref 150–400)
RBC: 3.37 MIL/uL — ABNORMAL LOW (ref 3.87–5.11)
RDW: 25 % — ABNORMAL HIGH (ref 11.5–15.5)
Smear Review: NORMAL
WBC: 10.2 10*3/uL (ref 4.0–10.5)
nRBC: 0.2 % (ref 0.0–0.2)

## 2020-04-13 LAB — PHOSPHORUS: Phosphorus: 4.1 mg/dL (ref 2.5–4.6)

## 2020-04-13 LAB — BASIC METABOLIC PANEL
Anion gap: 8 (ref 5–15)
BUN: 14 mg/dL (ref 6–20)
CO2: 27 mmol/L (ref 22–32)
Calcium: 8.5 mg/dL — ABNORMAL LOW (ref 8.9–10.3)
Chloride: 102 mmol/L (ref 98–111)
Creatinine, Ser: 0.93 mg/dL (ref 0.44–1.00)
GFR calc Af Amer: >60 mL/min (ref >60)
GFR calc non Af Amer: >60 mL/min (ref >60)
Glucose, Bld: 85 mg/dL (ref 70–99)
Potassium: 3.4 mmol/L — ABNORMAL LOW (ref 3.5–5.1)
Sodium: 137 mmol/L (ref 135–145)

## 2020-04-13 LAB — MAGNESIUM: Magnesium: 1.6 mg/dL — ABNORMAL LOW (ref 1.7–2.4)

## 2020-04-13 MED ORDER — POTASSIUM CHLORIDE CRYS ER 20 MEQ PO TBCR
40.0000 meq | EXTENDED_RELEASE_TABLET | Freq: Once | ORAL | Status: AC
  Administered 2020-04-13: 11:00:00 40 meq via ORAL
  Filled 2020-04-13: qty 2

## 2020-04-13 MED ORDER — MAGNESIUM SULFATE 2 GM/50ML IV SOLN
2.0000 g | Freq: Once | INTRAVENOUS | Status: AC
  Administered 2020-04-13: 2 g via INTRAVENOUS
  Filled 2020-04-13: qty 50

## 2020-04-13 NOTE — Progress Notes (Addendum)
PROGRESS NOTE    AZRA ABRELL  NOM:[id number] DOB: Oct 24, 1976 DOA: 03/26/2020 PCP: Malva Limes, MD    Brief Narrative:  Patient is a 44 year old female with history of acid reflex and history of stroke with residual left-sided weakness who present to the emergency room on 5/13 with generalized weakness and a fever. She was hypotensive upon arrival. She was diagnosed with severe COVID-19 Pneumonia, with secondary bacterial pneumonia, acute kidney injury and hypovolemic shock,and acute hypoxemic respiratory failure. Patient has completed  remdesivir. Patient never required hemodialysis. Did not require intubation.  Patient was transferred to stepdown unit from ICU on 5/24.    Consultants:   Nephrology, PCCM, cardiology  Procedures:  Antimicrobials:   None   Subjective: +UO. Appears less edematous. on4L at rest, requires 6L with ambulation in her room, but improves quickly with rest. She feels sob improving  Objective: Vitals:   04/12/20 0819 04/12/20 0822 04/13/20 0016 04/13/20 0500  BP: (!) 162/79  (!) 147/86   Pulse: 86  75   Resp: (!) 28 (!) 24 (!) 21   Temp: 98.7 F (37.1 C)  98.5 F (36.9 C)   TempSrc: Oral     SpO2: 97%  100%   Weight:    119.2 kg  Height:        Intake/Output Summary (Last 24 hours) at 04/13/2020 0818 Last data filed at 04/13/2020 0647 Gross per 24 hour  Intake --  Output 450 ml  Net -450 ml   Filed Weights   04/09/20 0500 04/10/20 0500 04/13/20 0500  Weight: 119.7 kg 119.5 kg 119.2 kg    Examination:  General exam: Appears calm and comfortable , nad  Respiratory system: bibasilar rales, no wheezing or rhonchi Cardiovascular system: S1 & S2 heard, RRR. No JVD, murmurs, rubs, gallops or clicks.  Gastrointestinal system: Abdomen is nondistended, soft and nontender.. Normal bowel sounds heard. Central nervous system: Alert and oriented x3.  Grossly intact Extremities: trace edema b/l has decreased Skin: Warm  dry Psychiatry: . Mood & affect appropriate in current setting.     Data Reviewed: I have personally reviewed following labs and imaging studies  CBC: Recent Labs  Lab 04/09/20 0708 04/10/20 0500 04/11/20 0526 04/12/20 0500 04/13/20 0548  WBC 13.7* 13.5* 12.4* 11.3* 10.2  NEUTROABS 11.6* 11.5* 9.5* 8.3* 7.6  HGB 7.9* 7.4* 7.4* 7.8* 7.8*  HCT 26.5* 24.9* 24.4* 25.5* 25.1*  MCV 74.2* 75.2* 73.7* 73.1* 74.5*  PLT 239 259 262 239 210   Basic Metabolic Panel: Recent Labs  Lab 04/08/20 0534 04/08/20 0534 04/09/20 0708 04/10/20 0500 04/11/20 0526 04/12/20 0500 04/13/20 0548  NA 141  --  141 138  --  134* 137  K 3.4*  --  3.8 3.8  --  3.2* 3.4*  CL 108  --  108 107  --  99 102  CO2 25  --  26 25  --  25 27  GLUCOSE 92  --  104* 120*  --  89 85  BUN 11  --  10 10  --  13 14  CREATININE 0.90  --  0.92 0.92  --  0.86 0.93  CALCIUM 8.2*  --  8.4* 8.5*  --  8.5* 8.5*  MG 1.6*   < > 1.7 2.0 1.5* 1.8 1.6*  PHOS 3.8   < > 3.6 3.8 4.0 3.6 4.1   < > = values in this interval not displayed.   GFR: Estimated Creatinine Clearance: 99.4 mL/min (by C-G formula based on  SCr of 0.93 mg/dL). Liver Function Tests: No results for input(s): AST, ALT, ALKPHOS, BILITOT, PROT, ALBUMIN in the last 168 hours. No results for input(s): LIPASE, AMYLASE in the last 168 hours. No results for input(s): AMMONIA in the last 168 hours. Coagulation Profile: No results for input(s): INR, PROTIME in the last 168 hours. Cardiac Enzymes: No results for input(s): CKTOTAL, CKMB, CKMBINDEX, TROPONINI in the last 168 hours. BNP (last 3 results) No results for input(s): PROBNP in the last 8760 hours. HbA1C: No results for input(s): HGBA1C in the last 72 hours. CBG: No results for input(s): GLUCAP in the last 168 hours. Lipid Profile: No results for input(s): CHOL, HDL, LDLCALC, TRIG, CHOLHDL, LDLDIRECT in the last 72 hours. Thyroid Function Tests: No results for input(s): TSH, T4TOTAL, FREET4, T3FREE,  THYROIDAB in the last 72 hours. Anemia Panel: No results for input(s): VITAMINB12, FOLATE, FERRITIN, TIBC, IRON, RETICCTPCT in the last 72 hours. Sepsis Labs: Recent Labs  Lab 04/08/20 0534  PROCALCITON 0.32    No results found for this or any previous visit (from the past 240 hour(s)).       Radiology Studies: No results found.      Scheduled Meds: . sodium chloride   Intravenous Once  . aerochamber plus with mask  1 each Other Once  . albuterol  1 puff Inhalation Q4H  . amLODipine  10 mg Oral Daily  . B-complex with vitamin C  1 tablet Oral Daily  . buprenorphine-naloxone  1 tablet Sublingual Daily  . Chlorhexidine Gluconate Cloth  6 each Topical Daily  . ciclesonide  4 puff Inhalation BID  . dexamethasone (DECADRON) injection  4 mg Intravenous Q24H  . feeding supplement (ENSURE ENLIVE)  237 mL Oral TID BM  . furosemide  40 mg Intravenous Daily  . ipratropium  2 puff Inhalation Q4H  . labetalol  300 mg Oral BID  . polyethylene glycol  17 g Oral Daily  . potassium chloride  40 mEq Oral Once  . senna-docusate  2 tablet Oral BID  . sodium chloride flush  10-40 mL Intracatheter Q12H  . sodium chloride flush  3 mL Intravenous Q12H   Continuous Infusions: . sodium chloride    . magnesium sulfate bolus IVPB      Assessment & Plan:   Active Problems:   Renal failure   Acute hypoxemic respiratory failure (HCC)   Pneumonia due to COVID-19 virus   Iron deficiency anemia due to chronic blood loss  #1.  Acute hypoxemic respiratory failure secondary to COVID-19 pneumonia. Still requiring much o2, has been weaned down from 15L to 56L now.  Drops sats with ambulation and needs more 02l.   Echocardiogram showed a mild pulmonary hypertension with normal ejection fraction.  Patient had a completed a course of antibiotics for bacterial infection.  We will continue inhalers and steroids  pccm following we will continue Lasix as she still needs to diurese. >1net  negative.   2.  Severe iron deficiency anemia. Required PRBC transfusion, also received IV iron. No evidence of active bleeding. Continue to monitor CBC., will transfuse if Hg <7 Hg 7.4 started on Feso4  3.  Acute kidney injury with ATN.  Secondary to Covid infection. Renal function has improved.  4.  Chronic diastolic congestive heart failure with mild pulmonary hypertension. Clinically improving, but still Mildly volume overloaded. Good uo with lasix, will continue. Wean down 02 as tolerated keeping 02 sat >92% Still requiring high oxygen level especially with ambulation  5.  Morbid  obesity.  6.  E. coli UTI. Antibiotics completed.  7.Hypomagensium-replaced, magnesium 1.6 today  8.  Hypokalemia-likely from diuresis K is 3.4 Replace with potassium 40 mEq p.o. x1 For level   DVT prophylaxis:SCDs Code Status:Full Family Communication:None Disposition Plan:SNF Barrier: Medically still not optimized, will need snf, case mx working on it. Need to wean down 02 as she is still requiring high O2. Need to meet safe d/c planning . Likely few more days as she still requires iv steroids and iv lasix.  Likely be here couple of more days       LOS: 18 days   Time spent:45 minutes with more than 50% on COC    Lynn Ito, MD [facility] Pager [phone]  If 7PM-7AM, please contact night-coverage [email or web address] Password [facility] 04/13/2020, 8:18 AM Patient ID: Elaina Hoops, female   DOB: Aug 20, 1976, 44 y.o.   MRN: [id number]

## 2020-04-14 LAB — MAGNESIUM: Magnesium: 1.7 mg/dL (ref 1.7–2.4)

## 2020-04-14 LAB — CBC WITH DIFFERENTIAL/PLATELET
Abs Immature Granulocytes: 0.11 10*3/uL — ABNORMAL HIGH (ref 0.00–0.07)
Basophils Absolute: 0.1 10*3/uL (ref 0.0–0.1)
Basophils Relative: 1 %
Eosinophils Absolute: 0.1 10*3/uL (ref 0.0–0.5)
Eosinophils Relative: 1 %
HCT: 26.7 % — ABNORMAL LOW (ref 36.0–46.0)
Hemoglobin: 8.4 g/dL — ABNORMAL LOW (ref 12.0–15.0)
Immature Granulocytes: 1 %
Lymphocytes Relative: 17 %
Lymphs Abs: 1.7 10*3/uL (ref 0.7–4.0)
MCH: 23.2 pg — ABNORMAL LOW (ref 26.0–34.0)
MCHC: 31.5 g/dL (ref 30.0–36.0)
MCV: 73.8 fL — ABNORMAL LOW (ref 80.0–100.0)
Monocytes Absolute: 0.7 10*3/uL (ref 0.1–1.0)
Monocytes Relative: 7 %
Neutro Abs: 7.4 10*3/uL (ref 1.7–7.7)
Neutrophils Relative %: 73 %
Platelets: 205 10*3/uL (ref 150–400)
RBC: 3.62 MIL/uL — ABNORMAL LOW (ref 3.87–5.11)
RDW: 25.1 % — ABNORMAL HIGH (ref 11.5–15.5)
Smear Review: NORMAL
WBC: 10 10*3/uL (ref 4.0–10.5)
nRBC: 0 % (ref 0.0–0.2)

## 2020-04-14 LAB — BASIC METABOLIC PANEL
Anion gap: 10 (ref 5–15)
BUN: 20 mg/dL (ref 6–20)
CO2: 28 mmol/L (ref 22–32)
Calcium: 8.7 mg/dL — ABNORMAL LOW (ref 8.9–10.3)
Chloride: 97 mmol/L — ABNORMAL LOW (ref 98–111)
Creatinine, Ser: 0.98 mg/dL (ref 0.44–1.00)
GFR calc Af Amer: >60 mL/min (ref >60)
GFR calc non Af Amer: >60 mL/min (ref >60)
Glucose, Bld: 119 mg/dL — ABNORMAL HIGH (ref 70–99)
Potassium: 3.6 mmol/L (ref 3.5–5.1)
Sodium: 135 mmol/L (ref 135–145)

## 2020-04-14 LAB — PHOSPHORUS: Phosphorus: 3.8 mg/dL (ref 2.5–4.6)

## 2020-04-14 MED ORDER — FLUTICASONE PROPIONATE HFA 220 MCG/ACT IN AERO
2.0000 | INHALATION_SPRAY | Freq: Two times a day (BID) | RESPIRATORY_TRACT | Status: DC
  Administered 2020-04-14 – 2020-04-16 (×5): 2 via RESPIRATORY_TRACT
  Filled 2020-04-14: qty 12

## 2020-04-14 NOTE — Progress Notes (Signed)
   04/14/20 0420  What Happened  Was fall witnessed? Yes  Who witnessed fall? Thermon Leyland NT  Patients activity before fall other (comment) (assist to [facility])  Point of contact buttocks  Was patient injured? No  Follow Up  MD notified Ouma NP  Time MD notified [phone]  Simple treatment Other (comment) (None )  Progress note created (see row info) Yes  Adult Fall Risk Assessment  Risk Factor Category (scoring not indicated) Fall has occurred during this admission (document High fall risk)  Patient Fall Risk Level High fall risk  Adult Fall Risk Interventions  Required Bundle Interventions *See Row Information* High fall risk - low, moderate, and high requirements implemented  Additional Interventions Use of appropriate toileting equipment (bedpan, BSC, etc.);Room near nurses station  Screening for Fall Injury Risk (To be completed on HIGH fall risk patients) - Assessing Need for Low Bed  Risk For Fall Injury- Low Bed Criteria Previous fall this admission  Will Implement Low Bed and Floor Mats Yes  Screening for Fall Injury Risk (To be completed on HIGH fall risk patients who do not meet crieteria for Low Bed) - Assessing Need for Floor Mats Only  Risk For Fall Injury- Criteria for Floor Mats None identified - No additional interventions needed  Pain Assessment  Pain Scale 0-10  Pain Score 0  Neurological  Neuro (WDL) WDL  Level of Consciousness Alert  Orientation Level Oriented X4  Cognition Appropriate at baseline  Speech Clear  Glasgow Coma Scale  Eye Opening 4  Best Verbal Response (NON-intubated) 5  Best Motor Response 6  Glasgow Coma Scale Score 15  Musculoskeletal  Musculoskeletal (WDL) X  Assistive Device BSC  Generalized Weakness Yes  Integumentary  Integumentary (WDL) X  Skin Color Appropriate for ethnicity  Skin Condition Dry  Skin Integrity Abrasion  Abrasion Location Buttocks  Abrasion Location Orientation Left

## 2020-04-14 NOTE — Progress Notes (Signed)
Physical Therapy Treatment Patient Details Name: Diane Keller MRN: [id number] DOB: 16-Jun-1976 Today's Date: 04/14/2020    History of Present Illness Per MD notes: Pt is a 44 y.o. female with a history of GERD and prior stroke with residual left-sided motor deficit who is brought to the ED due to fever and generalized weakness.  MD assessment includes: COVID-19 pneumonia, AMS/confusion, morbid obesity, and AKI/renal failure.    PT Comments    Pt is making good progress towards goals with decreased O2 demand at this time, currently on 1.5L of O2. Reports she is doing well with her HEP and can tell it is making a difference. Reports she fell this morning while transferring from bed->BSC. She is frustrated that she fell and a little anxious about ambulation right now. Agreeable to supine/seated there-ex. Slight desat with changing positions, however recovers quickly. Encouraged continued use of IS. Will continue to progress as able.   Follow Up Recommendations  SNF     Equipment Recommendations  Rolling walker with 5" wheels    Recommendations for Other Services       Precautions / Restrictions Precautions Precautions: Fall Restrictions Weight Bearing Restrictions: No    Mobility  Bed Mobility Overal bed mobility: Modified Independent Bed Mobility: Supine to Sit;Sit to Supine     Supine to sit: Modified independent (Device/Increase time) Sit to supine: Modified independent (Device/Increase time)   General bed mobility comments: improved technique with ability to quickly obtain seated position. O2 sats do decrease to 80% with slight activity.   Transfers                 General transfer comment: refused further activity as she reports she just got back in the bed.  Ambulation/Gait                 Stairs             Wheelchair Mobility    Modified Rankin (Stroke Patients Only)       Balance Overall balance assessment: Needs  assistance Sitting-balance support: Feet supported;No upper extremity supported Sitting balance-Leahy Scale: Good                                      Cognition Arousal/Alertness: Awake/alert Behavior During Therapy: WFL for tasks assessed/performed Overall Cognitive Status: Within Functional Limits for tasks assessed                                        Exercises Other Exercises Other Exercises: supine/seated ther-ex performed on B LE including SLR, bridging, resisted heel slides, LAQ, and hip add squeezes. All ther-ex x 10 reps with safe technique. Fatigues after 10 reps.     General Comments        Pertinent Vitals/Pain Pain Assessment: No/denies pain    Home Living                      Prior Function            PT Goals (current goals can now be found in the care plan section) Acute Rehab PT Goals Patient Stated Goal: To get back to caring for my son PT Goal Formulation: Patient unable to participate in goal setting Time For Goal Achievement: 04/12/20 Potential to Achieve Goals: Good Progress towards PT  goals: Progressing toward goals    Frequency    Min 2X/week      PT Plan Current plan remains appropriate    Co-evaluation              AM-PAC PT "6 Clicks" Mobility   Outcome Measure  Help needed turning from your back to your side while in a flat bed without using bedrails?: A Little Help needed moving from lying on your back to sitting on the side of a flat bed without using bedrails?: A Little Help needed moving to and from a bed to a chair (including a wheelchair)?: A Little Help needed standing up from a chair using your arms (e.g., wheelchair or bedside chair)?: A Little Help needed to walk in hospital room?: A Lot Help needed climbing 3-5 steps with a railing? : Total 6 Click Score: 15    End of Session Equipment Utilized During Treatment: Gait belt;Oxygen Activity Tolerance: Patient tolerated  treatment well Patient left: in bed;with bed alarm set Nurse Communication: Mobility status PT Visit Diagnosis: Muscle weakness (generalized) (M62.81);Difficulty in walking, not elsewhere classified (R26.2)     Time: 2841-3244 PT Time Calculation (min) (ACUTE ONLY): 23 min  Charges:  $Therapeutic Exercise: 23-37 mins                     Diane Keller, [place], DPT [phone]    Diane Keller 04/14/2020, 12:29 PM

## 2020-04-14 NOTE — Progress Notes (Signed)
PROGRESS NOTE    Diane Keller  WCH:[id number] DOB: May 17, 1976 DOA: 03/26/2020 PCP: Birdie Sons, MD    Brief Narrative:  Patient is a 44 year old female with history of acid reflex and history of stroke with residual left-sided weakness who present to the emergency room on 5/13 with generalized weakness and a fever. She was hypotensive upon arrival. She was diagnosed with severe COVID-19 Pneumonia, with secondary bacterial pneumonia, acute kidney injury and hypovolemic shock,and acute hypoxemic respiratory failure. Patient has completed  remdesivir. Patient never required hemodialysis. Did not require intubation.  Patient was transferred to stepdown unit from ICU on 5/24.    Consultants:   Nephrology, PCCM, cardiology  Procedures:  Antimicrobials:   None   Subjective: Had assisted falll this am while going to the bathroom with staff. She is upset about this. Doesn't complain of pain.   Objective: Vitals:   04/14/20 0459 04/14/20 0500 04/14/20 0546 04/14/20 0800  BP:    (!) 156/78  Pulse: 79  74 72  Resp: 20  20 19   Temp:    98.5 F (36.9 C)  TempSrc:    Oral  SpO2: 98%  99% 96%  Weight:  119.4 kg    Height:        Intake/Output Summary (Last 24 hours) at 04/14/2020 0829 Last data filed at 04/13/2020 1500 Gross per 24 hour  Intake 530.47 ml  Output 1200 ml  Net -669.53 ml   Filed Weights   04/10/20 0500 04/13/20 0500 04/14/20 0500  Weight: 119.5 kg 119.2 kg 119.4 kg    Examination:  General exam: Appears calm and comfortable , nad  Respiratory system: bibasilar rales, no wheezing or rhonchi Cardiovascular system: S1 & S2 heard, RRR. No JVD, murmurs, rubs, gallops or clicks.  Gastrointestinal system: Abdomen is nondistended, soft and nontender.. Normal bowel sounds heard. Central nervous system: Alert and oriented x3.  Grossly intact Extremities: trace edema b/l has decreased Skin: Warm dry Psychiatry: . Mood & affect appropriate in current  setting.     Data Reviewed: I have personally reviewed following labs and imaging studies  CBC: Recent Labs  Lab 04/10/20 0500 04/11/20 0526 04/12/20 0500 04/13/20 0548 04/14/20 0630  WBC 13.5* 12.4* 11.3* 10.2 10.0  NEUTROABS 11.5* 9.5* 8.3* 7.6 7.4  HGB 7.4* 7.4* 7.8* 7.8* 8.4*  HCT 24.9* 24.4* 25.5* 25.1* 26.7*  MCV 75.2* 73.7* 73.1* 74.5* 73.8*  PLT 259 262 239 210 353   Basic Metabolic Panel: Recent Labs  Lab 04/08/20 0534 04/08/20 0534 04/09/20 0708 04/09/20 0708 04/10/20 0500 04/11/20 0526 04/12/20 0500 04/13/20 0548 04/14/20 0630  NA 141  --  141  --  138  --  134* 137  --   K 3.4*  --  3.8  --  3.8  --  3.2* 3.4*  --   CL 108  --  108  --  107  --  99 102  --   CO2 25  --  26  --  25  --  25 27  --   GLUCOSE 92  --  104*  --  120*  --  89 85  --   BUN 11  --  10  --  10  --  13 14  --   CREATININE 0.90  --  0.92  --  0.92  --  0.86 0.93  --   CALCIUM 8.2*  --  8.4*  --  8.5*  --  8.5* 8.5*  --   MG 1.6*   < >  1.7   < > 2.0 1.5* 1.8 1.6* 1.7  PHOS 3.8   < > 3.6   < > 3.8 4.0 3.6 4.1 3.8   < > = values in this interval not displayed.   GFR: Estimated Creatinine Clearance: 99.4 mL/min (by C-G formula based on SCr of 0.93 mg/dL). Liver Function Tests: No results for input(s): AST, ALT, ALKPHOS, BILITOT, PROT, ALBUMIN in the last 168 hours. No results for input(s): LIPASE, AMYLASE in the last 168 hours. No results for input(s): AMMONIA in the last 168 hours. Coagulation Profile: No results for input(s): INR, PROTIME in the last 168 hours. Cardiac Enzymes: No results for input(s): CKTOTAL, CKMB, CKMBINDEX, TROPONINI in the last 168 hours. BNP (last 3 results) No results for input(s): PROBNP in the last 8760 hours. HbA1C: No results for input(s): HGBA1C in the last 72 hours. CBG: No results for input(s): GLUCAP in the last 168 hours. Lipid Profile: No results for input(s): CHOL, HDL, LDLCALC, TRIG, CHOLHDL, LDLDIRECT in the last 72 hours. Thyroid  Function Tests: No results for input(s): TSH, T4TOTAL, FREET4, T3FREE, THYROIDAB in the last 72 hours. Anemia Panel: No results for input(s): VITAMINB12, FOLATE, FERRITIN, TIBC, IRON, RETICCTPCT in the last 72 hours. Sepsis Labs: Recent Labs  Lab 04/08/20 0534  PROCALCITON 0.32    No results found for this or any previous visit (from the past 240 hour(s)).       Radiology Studies: DG Chest Port 1 View  Result Date: 04/13/2020 CLINICAL DATA:  COVID-19 positive.  Shortness of breath. EXAM: PORTABLE CHEST 1 VIEW COMPARISON:  Apr 07, 2020 FINDINGS: The right PICC line terminates in the central SVC. No pneumothorax. Bilateral pulmonary infiltrates persist but have improved. No other changes. IMPRESSION: 1. Appropriately placed right PICC line terminating in the SVC. 2. Significant but improving bilateral pulmonary infiltrates. Electronically Signed   By: Gerome Sam III M.D   On: 04/13/2020 13:44        Scheduled Meds:  sodium chloride   Intravenous Once   aerochamber plus with mask  1 each Other Once   albuterol  1 puff Inhalation Q4H   amLODipine  10 mg Oral Daily   B-complex with vitamin C  1 tablet Oral Daily   buprenorphine-naloxone  1 tablet Sublingual Daily   Chlorhexidine Gluconate Cloth  6 each Topical Daily   dexamethasone (DECADRON) injection  4 mg Intravenous Q24H   feeding supplement (ENSURE ENLIVE)  237 mL Oral TID BM   fluticasone  2 puff Inhalation BID   furosemide  40 mg Intravenous Daily   ipratropium  2 puff Inhalation Q4H   labetalol  300 mg Oral BID   polyethylene glycol  17 g Oral Daily   senna-docusate  2 tablet Oral BID   sodium chloride flush  10-40 mL Intracatheter Q12H   sodium chloride flush  3 mL Intravenous Q12H   Continuous Infusions:  sodium chloride      Assessment & Plan:   Active Problems:   Renal failure   Acute hypoxemic respiratory failure (HCC)   Pneumonia due to COVID-19 virus   Iron deficiency anemia due  to chronic blood loss  #1.  Acute hypoxemic respiratory failure secondary to COVID-19 pneumonia. Still requiring much o2, has been weaned down from 15L to 56L now.  Drops sats with ambulation and needs more 02l.   Echocardiogram showed a mild pulmonary hypertension with normal ejection fraction.  Patient had a completed a course of antibiotics for bacterial infection.  We will  continue inhalers and steroids  pccm following  continue Lasix  F/u on todays bmp   2. Severe iron deficiency anemia. Required PRBC transfusion, also received IV iron. No evidence of active bleeding. Continue to monitor CBC., will transfuse if Hg <7 H/h stable  started on Feso4  3.  Acute kidney injury with ATN.  Secondary to Covid infection. Renal function has improved.  4.  Chronic diastolic congestive heart failure with mild pulmonary hypertension. Clinically improving, but still Mildly volume overloaded. Good uo with lasix, will continue. Wean down 02 as tolerated keeping 02 sat >92% Oxygen requirement is improving, now around 2L at rest. Need to see with ambulation if still requiring more, was 6L  5.  Morbid obesity.  6.  E. coli UTI. Antibiotics completed.  7.Hypomagensium-replaced, magnesium 1.6 today  8.  Hypokalemia-likely from diuresis Replaced. Today labs pending   DVT prophylaxis:SCDs Code Status:Full Family Communication:None Disposition Plan:SNF Barrier: Medically still not optimized, will need snf, case mx working on it. Need to wean down 02 as she is still requiring high O2. Need to meet safe d/c planning . SNF placement pending, case mx working on this      LOS: 19 days   Time spent:45 minutes with more than 50% on COC    Lynn Ito, MD [facility] Pager [phone]  If 7PM-7AM, please contact night-coverage [email or web address] Password [facility] 04/14/2020, 8:29 AM Patient ID: Diane Keller, female   DOB: Feb 20, 1976, 44 y.o.   MRN: [id number]

## 2020-04-15 LAB — CBC WITH DIFFERENTIAL/PLATELET
Abs Immature Granulocytes: 0.07 10*3/uL (ref 0.00–0.07)
Basophils Absolute: 0.1 10*3/uL (ref 0.0–0.1)
Basophils Relative: 1 %
Eosinophils Absolute: 0.1 10*3/uL (ref 0.0–0.5)
Eosinophils Relative: 1 %
HCT: 26.6 % — ABNORMAL LOW (ref 36.0–46.0)
Hemoglobin: 8.1 g/dL — ABNORMAL LOW (ref 12.0–15.0)
Immature Granulocytes: 1 %
Lymphocytes Relative: 21 %
Lymphs Abs: 1.9 10*3/uL (ref 0.7–4.0)
MCH: 23.1 pg — ABNORMAL LOW (ref 26.0–34.0)
MCHC: 30.5 g/dL (ref 30.0–36.0)
MCV: 76 fL — ABNORMAL LOW (ref 80.0–100.0)
Monocytes Absolute: 0.6 10*3/uL (ref 0.1–1.0)
Monocytes Relative: 7 %
Neutro Abs: 6.2 10*3/uL (ref 1.7–7.7)
Neutrophils Relative %: 69 %
Platelets: 214 10*3/uL (ref 150–400)
RBC: 3.5 MIL/uL — ABNORMAL LOW (ref 3.87–5.11)
RDW: 25.1 % — ABNORMAL HIGH (ref 11.5–15.5)
Smear Review: NORMAL
WBC: 8.9 10*3/uL (ref 4.0–10.5)
nRBC: 0 % (ref 0.0–0.2)

## 2020-04-15 LAB — BASIC METABOLIC PANEL
Anion gap: 9 (ref 5–15)
BUN: 17 mg/dL (ref 6–20)
CO2: 27 mmol/L (ref 22–32)
Calcium: 8.6 mg/dL — ABNORMAL LOW (ref 8.9–10.3)
Chloride: 102 mmol/L (ref 98–111)
Creatinine, Ser: 0.92 mg/dL (ref 0.44–1.00)
GFR calc Af Amer: >60 mL/min (ref >60)
GFR calc non Af Amer: >60 mL/min (ref >60)
Glucose, Bld: 86 mg/dL (ref 70–99)
Potassium: 3 mmol/L — ABNORMAL LOW (ref 3.5–5.1)
Sodium: 138 mmol/L (ref 135–145)

## 2020-04-15 LAB — PHOSPHORUS: Phosphorus: 4 mg/dL (ref 2.5–4.6)

## 2020-04-15 LAB — MAGNESIUM: Magnesium: 1.6 mg/dL — ABNORMAL LOW (ref 1.7–2.4)

## 2020-04-15 MED ORDER — LACTULOSE 10 GM/15ML PO SOLN
20.0000 g | Freq: Once | ORAL | Status: DC
  Filled 2020-04-15: qty 30

## 2020-04-15 MED ORDER — POTASSIUM CHLORIDE CRYS ER 20 MEQ PO TBCR
40.0000 meq | EXTENDED_RELEASE_TABLET | ORAL | Status: AC
  Administered 2020-04-15 (×2): 40 meq via ORAL
  Filled 2020-04-15 (×2): qty 2

## 2020-04-15 MED ORDER — MAGNESIUM SULFATE 2 GM/50ML IV SOLN
2.0000 g | Freq: Once | INTRAVENOUS | Status: AC
  Administered 2020-04-15: 2 g via INTRAVENOUS
  Filled 2020-04-15: qty 50

## 2020-04-15 NOTE — Progress Notes (Signed)
PROGRESS NOTE    Diane Keller  JJO:[id number] DOB: 02-09-1976 DOA: 03/26/2020 PCP: Birdie Sons, MD   Chief complaint.  Shortness of breath and hypoxemia. Brief Narrative:  Patient is a 44 year old female with history of acid reflex and history of stroke with residual left-sided weakness who present to the emergency room on 5/13 with generalized weakness and a fever. She was hypotensive upon arrival. She was diagnosed with severe COVID-19 Pneumonia, with secondary bacterial pneumonia, acute kidney injury and hypovolemic shock,and acute hypoxemic respiratoryfailure. Patient has completed remdesivir. Patient never required hemodialysis. Did not require intubation.Patient was transferred to stepdown unit from ICU on 5/24.  6/1.  Patient hypoxemia is gradually improving.  Currently on 2 L oxygen.   Assessment & Plan:   Active Problems:   Renal failure   Acute hypoxemic respiratory failure (HCC)   Pneumonia due to COVID-19 virus   Iron deficiency anemia due to chronic blood loss  #1.  Acute hypoxemic respiratory failure secondary to Covid pneumonia. Patient condition gradually improving.  Echocardiogram showed normal ejection fraction with mild pulmonary hypertension.  She has completed antibiotics for bacterial infection.  She has a mild elevation of her BNP, she is still on Lasix. Completed 10 days of dexamethasone.  Will discontinue.  #2.  Severe iron deficient anemia. She has received IV iron and PRBC transfusion. Continue oral iron treatment.  Continue monitor hemoglobin, transfuse as needed.  3.  Acute kidney injury secondary to ATN. Condition had improved.  4.  Chronic diastolic congestive heart failure with mild pulmonary hypertension. Continue some IV Lasix.  5.  Morbid obesity pain  6.  E. coli urinary tract infection.  Completed antibiotics.  7.  Hypokalemia and hypomagnesemia. Continue to monitor and supplement as needed.      DVT prophylaxis:  SCDs Code Status: Full Family Communication: None Disposition Plan:  . Patient came from: Home            . Anticipated d/c place: Home . Barriers to d/c OR conditions which need to be met to effect a safe d/c:   Consultants:   Pulmonology  Nephrology  Procedures: None Antimicrobials:None  Subjective: Patient feels much better today, she has less oxygen requirement down to 2 L.  No signal short of breath at rest, still has some short of breath with exertion.  Cough, nonproductive pain She does not have any diarrhea.  No abdominal pain.  She states that she has normal bowel movement last night.  She refused stool softeners. No dysuria hematuria. No fever or chills.  Objective: Vitals:   04/15/20 0444 04/15/20 0446 04/15/20 0538 04/15/20 0800  BP: (!) 160/87   (!) 180/86  Pulse: 77 73 66 79  Resp: (!) '23 19 16 18  ' Temp:    98.5 F (36.9 C)  TempSrc:    Oral  SpO2: 98% 96% 96% 95%  Weight:      Height:       No intake or output data in the 24 hours ending 04/15/20 1537 Filed Weights   04/10/20 0500 04/13/20 0500 04/14/20 0500  Weight: 119.5 kg 119.2 kg 119.4 kg    Examination:  General exam: Appears calm and comfortable. Morbid Obese. Respiratory system: Clear to auscultation. Respiratory effort normal. Cardiovascular system: S1 & S2 heard, RRR. No JVD, murmurs, rubs, gallops or clicks. No pedal edema. Gastrointestinal system: Abdomen is nondistended, soft and nontender. No organomegaly or masses felt. Normal bowel sounds heard. Central nervous system: Alert and oriented. No focal neurological deficits.  Extremities: Symmetric  Skin: No rashes, lesions or ulcers Psychiatry: Judgement and insight appear normal. Mood & affect appropriate.     Data Reviewed: I have personally reviewed following labs and imaging studies  CBC: Recent Labs  Lab 04/11/20 0526 04/12/20 0500 04/13/20 0548 04/14/20 0630 04/15/20 0440  WBC 12.4* 11.3* 10.2 10.0 8.9  NEUTROABS 9.5*  8.3* 7.6 7.4 6.2  HGB 7.4* 7.8* 7.8* 8.4* 8.1*  HCT 24.4* 25.5* 25.1* 26.7* 26.6*  MCV 73.7* 73.1* 74.5* 73.8* 76.0*  PLT 262 239 210 205 681   Basic Metabolic Panel: Recent Labs  Lab 04/10/20 0500 04/10/20 0500 04/11/20 0526 04/12/20 0500 04/13/20 0548 04/14/20 0630 04/14/20 1900 04/15/20 0440  NA 138  --   --  134* 137  --  135 138  K 3.8  --   --  3.2* 3.4*  --  3.6 3.0*  CL 107  --   --  99 102  --  97* 102  CO2 25  --   --  25 27  --  28 27  GLUCOSE 120*  --   --  89 85  --  119* 86  BUN 10  --   --  13 14  --  20 17  CREATININE 0.92  --   --  0.86 0.93  --  0.98 0.92  CALCIUM 8.5*  --   --  8.5* 8.5*  --  8.7* 8.6*  MG 2.0   < > 1.5* 1.8 1.6* 1.7  --  1.6*  PHOS 3.8   < > 4.0 3.6 4.1 3.8  --  4.0   < > = values in this interval not displayed.   GFR: Estimated Creatinine Clearance: 100.5 mL/min (by C-G formula based on SCr of 0.92 mg/dL). Liver Function Tests: No results for input(s): AST, ALT, ALKPHOS, BILITOT, PROT, ALBUMIN in the last 168 hours. No results for input(s): LIPASE, AMYLASE in the last 168 hours. No results for input(s): AMMONIA in the last 168 hours. Coagulation Profile: No results for input(s): INR, PROTIME in the last 168 hours. Cardiac Enzymes: No results for input(s): CKTOTAL, CKMB, CKMBINDEX, TROPONINI in the last 168 hours. BNP (last 3 results) No results for input(s): PROBNP in the last 8760 hours. HbA1C: No results for input(s): HGBA1C in the last 72 hours. CBG: No results for input(s): GLUCAP in the last 168 hours. Lipid Profile: No results for input(s): CHOL, HDL, LDLCALC, TRIG, CHOLHDL, LDLDIRECT in the last 72 hours. Thyroid Function Tests: No results for input(s): TSH, T4TOTAL, FREET4, T3FREE, THYROIDAB in the last 72 hours. Anemia Panel: No results for input(s): VITAMINB12, FOLATE, FERRITIN, TIBC, IRON, RETICCTPCT in the last 72 hours. Sepsis Labs: No results for input(s): PROCALCITON, LATICACIDVEN in the last 168 hours.  No  results found for this or any previous visit (from the past 240 hour(s)).       Radiology Studies: No results found.      Scheduled Meds: . sodium chloride   Intravenous Once  . aerochamber plus with mask  1 each Other Once  . albuterol  1 puff Inhalation Q4H  . amLODipine  10 mg Oral Daily  . B-complex with vitamin C  1 tablet Oral Daily  . buprenorphine-naloxone  1 tablet Sublingual Daily  . Chlorhexidine Gluconate Cloth  6 each Topical Daily  . dexamethasone (DECADRON) injection  4 mg Intravenous Q24H  . feeding supplement (ENSURE ENLIVE)  237 mL Oral TID BM  . fluticasone  2 puff Inhalation BID  .  furosemide  40 mg Intravenous Daily  . ipratropium  2 puff Inhalation Q4H  . labetalol  300 mg Oral BID  . lactulose  20 g Oral Once  . polyethylene glycol  17 g Oral Daily  . potassium chloride  40 mEq Oral Q4H  . senna-docusate  2 tablet Oral BID  . sodium chloride flush  10-40 mL Intracatheter Q12H  . sodium chloride flush  3 mL Intravenous Q12H   Continuous Infusions: . sodium chloride       LOS: 20 days    Time spent: 26 minutes    Sharen Hones, MD [facility]   To contact the attending provider between 7A-7P or the covering provider during after hours 7P-7A, please log into the web site [email or web address] and access using universal [place] password for that web site. If you do not have the password, please call the hospital operator.  04/15/2020, 3:37 PM

## 2020-04-15 NOTE — Progress Notes (Addendum)
Patient refused low bed. Patient educated on purpose and need of bed since previous fall this hospital stay. Patient still refuses bed stating "I do not want to switch beds, I do not need it and the doctor says I am being discharged tomorrow"

## 2020-04-16 LAB — CBC WITH DIFFERENTIAL/PLATELET
Abs Immature Granulocytes: 0.05 10*3/uL (ref 0.00–0.07)
Basophils Absolute: 0.1 10*3/uL (ref 0.0–0.1)
Basophils Relative: 1 %
Eosinophils Absolute: 0.1 10*3/uL (ref 0.0–0.5)
Eosinophils Relative: 1 %
HCT: 27.4 % — ABNORMAL LOW (ref 36.0–46.0)
Hemoglobin: 8.3 g/dL — ABNORMAL LOW (ref 12.0–15.0)
Immature Granulocytes: 1 %
Lymphocytes Relative: 23 %
Lymphs Abs: 2 10*3/uL (ref 0.7–4.0)
MCH: 23 pg — ABNORMAL LOW (ref 26.0–34.0)
MCHC: 30.3 g/dL (ref 30.0–36.0)
MCV: 75.9 fL — ABNORMAL LOW (ref 80.0–100.0)
Monocytes Absolute: 0.6 10*3/uL (ref 0.1–1.0)
Monocytes Relative: 6 %
Neutro Abs: 5.9 10*3/uL (ref 1.7–7.7)
Neutrophils Relative %: 68 %
Platelets: 231 10*3/uL (ref 150–400)
RBC: 3.61 MIL/uL — ABNORMAL LOW (ref 3.87–5.11)
RDW: 25.2 % — ABNORMAL HIGH (ref 11.5–15.5)
Smear Review: NORMAL
WBC: 8.7 10*3/uL (ref 4.0–10.5)
nRBC: 0 % (ref 0.0–0.2)

## 2020-04-16 LAB — FUNGITELL, SERUM: Fungitell Result: 80 pg/mL — ABNORMAL HIGH (ref <80)

## 2020-04-16 LAB — POTASSIUM: Potassium: 3.2 mmol/L — ABNORMAL LOW (ref 3.5–5.1)

## 2020-04-16 LAB — MAGNESIUM: Magnesium: 2.1 mg/dL (ref 1.7–2.4)

## 2020-04-16 LAB — PHOSPHORUS: Phosphorus: 4.4 mg/dL (ref 2.5–4.6)

## 2020-04-16 MED ORDER — POTASSIUM CHLORIDE ER 10 MEQ PO TBCR: 40.0000 meq | EXTENDED_RELEASE_TABLET | Freq: Every day | ORAL | 0 refills | Status: DC

## 2020-04-16 MED ORDER — MAGNESIUM CHLORIDE 64 MG PO TABS: 128.0000 mg | ORAL_TABLET | Freq: Two times a day (BID) | ORAL | 0 refills | Status: AC

## 2020-04-16 MED ORDER — B COMPLEX-C PO TABS: 1.0000 | ORAL_TABLET | Freq: Every day | ORAL | 0 refills | Status: AC

## 2020-04-16 MED ORDER — FLUTICASONE PROPIONATE HFA 220 MCG/ACT IN AERO: 2.0000 | INHALATION_SPRAY | Freq: Two times a day (BID) | RESPIRATORY_TRACT | 0 refills | Status: DC

## 2020-04-16 MED ORDER — FERROUS SULFATE 325 (65 FE) MG PO TBEC
325.0000 mg | DELAYED_RELEASE_TABLET | Freq: Every day | ORAL | 0 refills | Status: DC
Start: 2020-04-16 — End: 2022-06-14

## 2020-04-16 MED ORDER — AMLODIPINE BESYLATE 10 MG PO TABS: 10.0000 mg | ORAL_TABLET | Freq: Every day | ORAL | 0 refills | Status: DC

## 2020-04-16 MED ORDER — IPRATROPIUM BROMIDE HFA 17 MCG/ACT IN AERS: 2.0000 | INHALATION_SPRAY | RESPIRATORY_TRACT | 0 refills | Status: DC

## 2020-04-16 MED ORDER — ALBUTEROL SULFATE HFA 108 (90 BASE) MCG/ACT IN AERS: 1.0000 | INHALATION_SPRAY | RESPIRATORY_TRACT | 0 refills | Status: DC

## 2020-04-16 MED ORDER — POTASSIUM CHLORIDE 20 MEQ PO PACK
40.0000 meq | PACK | ORAL | Status: AC
  Administered 2020-04-16 (×3): 40 meq via ORAL
  Filled 2020-04-16 (×2): qty 2

## 2020-04-16 MED ORDER — FUROSEMIDE 40 MG PO TABS
40.0000 mg | ORAL_TABLET | Freq: Every day | ORAL | 11 refills | Status: DC
Start: 2020-04-16 — End: 2020-05-01

## 2020-04-16 NOTE — Progress Notes (Signed)
SATURATION QUALIFICATIONS: (This note is used to comply with regulatory documentation for home oxygen)  Patient Saturations on Room Air at Rest = 91%  Patient Saturations on Room Air while Ambulating = 84%  Patient Saturations on 2 Liters of oxygen while Ambulating = 91%  Please briefly explain why patient needs home oxygen: 

## 2020-04-16 NOTE — Discharge Summary (Signed)
Physician Discharge Summary  Patient ID: Diane Keller MRN: [id number] DOB/AGE: 12/28/1975 44 y.o.  Admit date: 03/26/2020 Discharge date: 04/16/2020  Admission Diagnoses:  Discharge Diagnoses:  Active Problems:   Renal failure   Acute hypoxemic respiratory failure (HCC)   Pneumonia due to COVID-19 virus   Iron deficiency anemia due to chronic blood loss   Discharged Condition: good  Hospital Course:  Patient is a 44 year old female with history of acid reflex and history of stroke with residual left-sided weakness who present to the emergency room on 5/13 with generalized weakness and a fever. She was hypotensive upon arrival. She was diagnosed with severe COVID-19 Pneumonia, with secondary bacterial pneumonia, acute kidney injury and hypovolemic shock,and acute hypoxemic respiratoryfailure. Patient has completed remdesivir. Patient never required hemodialysis. Did not require intubation.Patient was transferred to stepdown unit from ICU on 5/24.  6/1.  Patient hypoxemia is gradually improving.  Currently on 2 L oxygen.  #1.  Acute hypoxemic respiratory failure secondary to Covid pneumonia. Patient condition gradually improving.  Echocardiogram showed normal ejection fraction with mild pulmonary hypertension.  She has completed antibiotics for bacterial infection.    She is giving IV Lasix for elevated BNP.  At this point, her condition had improved.  I will continue Lasix 40 mg daily for 7 days.  She will be evaluated by her family doctor within a week to decide if she needs additional Lasix. Completed 10 days of dexamethasone.   She still has hypoxemia without oxygen, she will need a home oxygen at 2 L.  #2.  Severe iron deficient anemia. She has received IV iron and PRBC transfusion. I have prescribed ferrous sulfate 325 mg daily for a month for the patient.   3.  Acute kidney injury secondary to ATN. Condition had improved.  4.  Chronic diastolic congestive  heart failure with mild pulmonary hypertension. Continue oral Lasix 40 mg daily.  5.  Morbid obesity pain  6.  E. coli urinary tract infection.  Completed antibiotics.  7.  Hypokalemia and hypomagnesemia. Prescribed oral potassium and oral magnesium.     Consults:  Critical care Nephrology    Significant Diagnostic Studies:  PORTABLE CHEST 1 VIEW  COMPARISON:  Apr 07, 2020  FINDINGS: The right PICC line terminates in the central SVC. No pneumothorax. Bilateral pulmonary infiltrates persist but have improved. No other changes.  IMPRESSION: 1. Appropriately placed right PICC line terminating in the SVC. 2. Significant but improving bilateral pulmonary infiltrates.   Electronically Signed   By: Dorise Bullion III M.D   On: 04/13/2020 13:44  Echo  1. Left ventricular ejection fraction, by estimation, is 60 to 65%. The left ventricle has normal function. The left ventricle has no regional wall motion abnormalities. There is mild left ventricular hypertrophy. Left ventricular diastolic parameters are consistent with Grade I diastolic dysfunction (impaired relaxation). 2. Right ventricular systolic function is normal. The right ventricular size is normal. There is mildly elevated pulmonary artery systolic pressure. 3. Mild mitral valve regurgitation. 4. Agitated saline contrast bubble study was negative, with no evidence of any interatrial shunt.  Treatments: Abx, steroids, remdesivir, oxygen.  Discharge Exam: Blood pressure (!) 146/83, pulse 80, temperature 98.3 F (36.8 C), resp. rate 16, height 5\' 6"  (1.676 m), weight 119.3 kg, SpO2 95 %. General appearance: alert and cooperative Resp: clear to auscultation bilaterally Cardio: regular rate and rhythm, S1, S2 normal, no murmur, click, rub or gallop GI: soft, non-tender; bowel sounds normal; no masses,  no organomegaly Extremities: extremities normal,  atraumatic, no cyanosis or edema  Disposition:  Discharge disposition: 01-Home or Self Care       Discharge Instructions    Diet - low sodium heart healthy   Complete by: As directed    Increase activity slowly   Complete by: As directed      Allergies as of 04/16/2020      Reactions   Augmentin [amoxicillin-pot Clavulanate] Itching   Lisinopril Other (See Comments)   Cough      Medication List    TAKE these medications   albuterol 108 (90 Base) MCG/ACT inhaler Commonly known as: VENTOLIN HFA Inhale 1 puff into the lungs every 4 (four) hours.   amLODipine 10 MG tablet Commonly known as: NORVASC Take 1 tablet (10 mg total) by mouth daily. Start taking on: April 17, 2020   B-complex with vitamin C tablet Take 1 tablet by mouth daily for 10 days. Start taking on: April 17, 2020   fluticasone 220 MCG/ACT inhaler Commonly known as: FLOVENT HFA Inhale 2 puffs into the lungs 2 (two) times daily.   furosemide 40 MG tablet Commonly known as: Lasix Take 1 tablet (40 mg total) by mouth daily for 7 days.   ipratropium 17 MCG/ACT inhaler Commonly known as: ATROVENT HFA Inhale 2 puffs into the lungs every 4 (four) hours.   labetalol 300 MG tablet Commonly known as: NORMODYNE TAKE 1 TABLET (300 MG TOTAL) BY MOUTH 2 (TWO) TIMES DAILY.   omeprazole 20 MG capsule Commonly known as: PRILOSEC TAKE 1 CAPSULE BY MOUTH EVERY DAY What changed: how much to take   potassium chloride 10 MEQ tablet Commonly known as: KLOR-CON Take 4 tablets (40 mEq total) by mouth daily for 7 days.   Suboxone 8-2 MG Film Generic drug: Buprenorphine HCl-Naloxone HCl Place 2 Film under the tongue daily.            Durable Medical Equipment  (From admission, onward)         Start     Ordered   04/16/20 1221  For home use only DME Walker  Once    Question:  Patient needs a walker to treat with the following condition  Answer:  Pneumonia due to COVID-19 virus   04/16/20 1222   04/16/20 1221  For home use only DME Bedside commode  Once     Question:  Patient needs a bedside commode to treat with the following condition  Answer:  Pneumonia due to COVID-19 virus   04/16/20 1222   04/16/20 1220  For home use only DME oxygen  Once    Question Answer Comment  Length of Need 6 Months   Mode or (Route) Nasal cannula   Liters per Minute 2   Frequency Continuous (stationary and portable oxygen unit needed)   Oxygen conserving device Yes   Oxygen delivery system Gas      04/16/20 1222         Follow-up Information    Malva Limes, MD Follow up in 1 week(s).   Specialty: Family Medicine Contact information: [address] [place] [address] [phone]         45 minutes  Signed: Marrion Coy 04/16/2020, 12:29 PM

## 2020-04-16 NOTE — Discharge Instructions (Signed)
Self quarantine for 14 days since symptoms started Follow-up with PCP in 1 week.

## 2020-04-16 NOTE — Progress Notes (Signed)
Occupational Therapy Treatment Patient Details Name: Diane Keller MRN: [id number] DOB: 1976/01/19 Today's Date: 04/16/2020    History of present illness Per MD notes: Pt is a 44 y.o. female with a history of GERD and prior stroke with residual left-sided motor deficit who is brought to the ED due to fever and generalized weakness.  MD assessment includes: COVID-19 pneumonia, AMS/confusion, morbid obesity, and AKI/renal failure.   OT comments  [name] was seen for OT treatment on this date. Upon arrival to room pt awake/alert, seated EOB, attempting to perform transfer to [facility] unassisted. OT assists pt with functional transfer and provides education on falls prevention strategies and safe use of AE/DME for ADL management throughout (see ADL section below for additional detail regarding occupational performance). Pt noted with poor recall/carryover of education provided from past session, but is able to return verbalize/demo understanding of education provided given moderate VCs for safety/sequencing t/o functional tasks. Pt progressing toward OT goals and continues to benefit from skilled OT services to maximize return to PLOF and minimize risk of future falls, injury, caregiver burden, and readmission. Will follow POC as written. SNF remains most appropriate DC recommendation at this time.    Follow Up Recommendations  SNF    Equipment Recommendations  3 in 1 bedside commode    Recommendations for Other Services      Precautions / Restrictions Precautions Precautions: Fall Restrictions Weight Bearing Restrictions: No       Mobility Bed Mobility Overal bed mobility: Modified Independent Bed Mobility: Supine to Sit;Sit to Supine     Supine to sit: Modified independent (Device/Increase time) Sit to supine: Modified independent (Device/Increase time)   General bed mobility comments: improved technique with ability to quickly obtain seated position. O2 sats do decrease to 84% with  positional change/activity.  Transfers Overall transfer level: Needs assistance Equipment used: Rolling walker (2 wheeled) Transfers: Sit to/from Stand Sit to Stand: Min guard         General transfer comment: Pt pefromfs functional transfer to/from [facility] w/ min guard for safety and moderate cueing for safety/technique.    Balance Overall balance assessment: Needs assistance Sitting-balance support: Feet supported;No upper extremity supported Sitting balance-Leahy Scale: Good Sitting balance - Comments: Steady static sitting, reaching within BOS. Requres 1 UE support during weight shift.   Standing balance support: Bilateral upper extremity supported;During functional activity Standing balance-Leahy Scale: Fair Standing balance comment: Heavy UE support on RW.                           ADL either performed or assessed with clinical judgement   ADL Overall ADL's : Needs assistance/impaired                         Toilet Transfer: Set up;Min guard;BSC Toilet Transfer Details (indicate cue type and reason): Pt recieved seated EOB attempting to transfer to [facility] using BSC for BUE support. Pt educated on safe use of RW for functional transfers including for transfer to [facility] this date. Pt return demos understanding of education provided. Min guard for safety t/o mobility with moderate cueing for hand/foot placement. Toileting- Clothing Manipulation and Hygiene: Sitting/lateral lean;Set up;Supervision/safety Toileting - Clothing Manipulation Details (indicate cue type and reason): Pt performs toileting/hygiene with supervision for safety and assist to reach objects outside BOS including wipes. Pt performs peri-care with seated lateral lean.       General ADL Comments: Pt educated on  safe use of AE/DME for ADL management, falls prevention strategies, and safe transfer techniques this date. She return demos understanding given moderate cueing for safety/technique t/o.  Education provided on use of PLB strategies during functional tasks to support enegy conservation.     Vision Baseline Vision/History: No visual deficits Patient Visual Report: No change from baseline     Perception     Praxis      Cognition Arousal/Alertness: Awake/alert Behavior During Therapy: WFL for tasks assessed/performed Overall Cognitive Status: Within Functional Limits for tasks assessed                                          Exercises Other Exercises Other Exercises: OT facilitates toileting/toilet transfer this date. Pt educated on safe use of AE/DME for ADL management, falls prevention, and energy conservation strategies. Poor recall/carryover from past sessions noted. Pt continues to demo decreased safety awareness. Requires consistent cueing/contact guard for safety during functional tasks.   Shoulder Instructions       General Comments Pt vitals monitored t/o session. SpO2 noted to drop to 86-88% with activity. Pt remains on 2L [place] t/o session. Pt rebounds to >94% with cues for pursed lip breathing and therapeutic rest break.    Pertinent Vitals/ Pain       Pain Assessment: No/denies pain  Home Living Family/patient expects to be discharged to:: Private residence Living Arrangements: Spouse/significant other;Children Available Help at Discharge: Family                                    Prior Functioning/Environment              Frequency  Min 2X/week        Progress Toward Goals  OT Goals(current goals can now be found in the care plan section)  Progress towards OT goals: Progressing toward goals  Acute Rehab OT Goals Patient Stated Goal: To get back to caring for my son OT Goal Formulation: With patient Time For Goal Achievement: 04/23/20 Potential to Achieve Goals: Good  Plan Discharge plan remains appropriate    Co-evaluation                 AM-PAC OT "6 Clicks" Daily Activity     Outcome  Measure   Help from another person eating meals?: A Little Help from another person taking care of personal grooming?: A Little Help from another person toileting, which includes using toliet, bedpan, or urinal?: A Little Help from another person bathing (including washing, rinsing, drying)?: A Lot Help from another person to put on and taking off regular upper body clothing?: A Little Help from another person to put on and taking off regular lower body clothing?: A Lot 6 Click Score: 16    End of Session Equipment Utilized During Treatment: Gait belt;Rolling walker  OT Visit Diagnosis: Other abnormalities of gait and mobility (R26.89);Muscle weakness (generalized) (M62.81)   Activity Tolerance Patient tolerated treatment well   Patient Left in bed;with call bell/phone within reach;with bed alarm set   Nurse Communication Mobility status        Time: 1884-1660 OT Time Calculation (min): 27 min  Charges: OT General Charges $OT Visit: 1 Visit OT Treatments $Self Care/Home Management : 23-37 mins  Shara Blazing, M.S., OTR/L Ascom: [phone] 04/16/20, 3:01 PM

## 2020-04-16 NOTE — TOC Transition Note (Signed)
Transition of Care [facility]) - CM/SW Discharge Note   Patient Details  Name: DORATHEA FAERBER MRN: [id number] Date of Birth: 10-11-1976  [facility] CM/SW Contact:  Allayne Butcher, RN Phone Number: 04/16/2020, 2:23 PM   Clinical Narrative:    Patient has been medically cleared for discharge home.  Patient's family will be coming to pick her up.  Bedside Commode, rolling walker, and oxygen will be delivered to her room before leaving the hospital.  [employer] is providing equipment.  This RNCM was unable to find home health services, all agencies that service the [facility] area are short on staffing and unable to accept.   Outpatient PT and OT referral sent to the [facility] department.  Patient verbalized understanding and says that will be good.   No other discharge needs at this time.    Final next level of care: OP Rehab Barriers to Discharge: Barriers Resolved, No Home Care Agency will accept this patient   Patient Goals and CMS Choice   CMS Medicare.gov Compare Post Acute Care list provided to:: Patient Represenative (must comment) Choice offered to / list presented to : Spouse  Discharge Placement                       Discharge Plan and Services                DME Arranged: 3-N-1, Walker rolling, Oxygen DME Agency: [employer] Date DME Agency Contacted: 04/16/20 Time DME Agency Contacted: [phone] Representative spoke with at DME Agency: Oletha Cruel            Social Determinants of Health (SDOH) Interventions     Readmission Risk Interventions No flowsheet data found.

## 2020-04-21 ENCOUNTER — Encounter: Payer: Self-pay | Admitting: Family Medicine

## 2020-04-21 ENCOUNTER — Telehealth: Payer: Medicaid Other | Admitting: Family Medicine

## 2020-04-21 ENCOUNTER — Telehealth (INDEPENDENT_AMBULATORY_CARE_PROVIDER_SITE_OTHER): Payer: Medicaid Other | Admitting: Family Medicine

## 2020-04-21 ENCOUNTER — Other Ambulatory Visit: Payer: Self-pay

## 2020-04-21 VITALS — BP 127/70

## 2020-04-21 DIAGNOSIS — J1282 Pneumonia due to coronavirus disease 2019: Secondary | ICD-10-CM | POA: Diagnosis not present

## 2020-04-21 DIAGNOSIS — R0600 Dyspnea, unspecified: Secondary | ICD-10-CM

## 2020-04-21 DIAGNOSIS — R29898 Other symptoms and signs involving the musculoskeletal system: Secondary | ICD-10-CM | POA: Insufficient documentation

## 2020-04-21 DIAGNOSIS — E876 Hypokalemia: Secondary | ICD-10-CM | POA: Diagnosis not present

## 2020-04-21 DIAGNOSIS — N920 Excessive and frequent menstruation with regular cycle: Secondary | ICD-10-CM

## 2020-04-21 DIAGNOSIS — U071 COVID-19: Secondary | ICD-10-CM

## 2020-04-21 DIAGNOSIS — D500 Iron deficiency anemia secondary to blood loss (chronic): Secondary | ICD-10-CM | POA: Diagnosis not present

## 2020-04-21 DIAGNOSIS — R0609 Other forms of dyspnea: Secondary | ICD-10-CM

## 2020-04-21 NOTE — Progress Notes (Signed)
MyChart Video Visit    Virtual Visit via Video Note   This visit type was conducted due to national recommendations for restrictions regarding the COVID-19 Pandemic (e.g. social distancing) in an effort to limit this patient's exposure and mitigate transmission in our community. This patient is at least at moderate risk for complications without adequate follow up. This format is felt to be most appropriate for this patient at this time. Physical exam was limited by quality of the video and audio technology used for the visit.   Patient location: home  Provider location: bfp    Patient: Diane Keller   DOB: 1976/01/25   44 y.o. Female  MRN: [id number] Visit Date: 04/21/2020  Today's healthcare provider: Mila Merry, MD   Chief Complaint  Patient presents with   Hospitalization Follow-up   Subjective    HPI Follow up Hospitalization Patient was admitted to [facility] on 03/26/2020 and discharged on 04/16/2020.        She was treated for Renal failure,  Acute hypoxemic respiratory failure, Pneumonia due to COVID-19 virus and Iron deficiency anemia due to chronic blood loss. Treatment for this included Remdesivir.  She was given IV Lasix for elevated BNP and her condition improved.  Upon discharge she was to continue Lasix 40 mg daily for 7 days and follow up family doctor within a week to decide if she needs additional Lasix. Prior to discharged she completed 10 days of dexamethasone. She received IV iron and PRBC transfusion for treatment of Anemia, and was prescribed ferrous sulfate 325 mg daily for a month.  Per discharge summary patient should continue home oxygen at 2 L. Patient was prescribed oral potassium and oral magnesium for Hypokalemia and Hypomagnesemia.  Telephone follow up was not done. She reports good compliance with treatment. She reports this condition is improved. She still has some weakness in her muscles and fatigue. She denies any fevers or chills. Patient  states overall she feels 70% better today than when she was in the hospital.   She does report long history of anemia secondary to menorrhagia having had evaluation by Gyn in the past and having had procedure to control bleeding, but she states over the last few years her menstrual bleeding has been as heavy as it ever was in the past. She denies any other signs of unusual bleeding including red blood in stool and melena.   She had been bed bound in the hospital for weeks and continues to feel weak in the legs and having some trouble getting around. She reports she was supposed to have had physical therapy after discharge, but order was never placed.     Medications: Outpatient Medications Prior to Visit  Medication Sig   albuterol (VENTOLIN HFA) 108 (90 Base) MCG/ACT inhaler Inhale 1 puff into the lungs every 4 (four) hours.   amLODipine (NORVASC) 10 MG tablet Take 1 tablet (10 mg total) by mouth daily.   B Complex-C (B-COMPLEX WITH VITAMIN C) tablet Take 1 tablet by mouth daily for 10 days.   Buprenorphine HCl-Naloxone HCl (SUBOXONE) 8-2 MG FILM Place 2 Film under the tongue daily.   ferrous sulfate 325 (65 FE) MG EC tablet Take 1 tablet (325 mg total) by mouth daily with breakfast.   fluticasone (FLOVENT HFA) 220 MCG/ACT inhaler Inhale 2 puffs into the lungs 2 (two) times daily.   furosemide (LASIX) 40 MG tablet Take 1 tablet (40 mg total) by mouth daily for 7 days.   ipratropium (ATROVENT HFA)  17 MCG/ACT inhaler Inhale 2 puffs into the lungs every 4 (four) hours.   labetalol (NORMODYNE) 300 MG tablet TAKE 1 TABLET (300 MG TOTAL) BY MOUTH 2 (TWO) TIMES DAILY.   lisinopril (ZESTRIL) 20 MG tablet Take 20 mg by mouth daily.   Magnesium Chloride 64 MG TABS Take 128 mg by mouth in the morning and at bedtime for 7 days.   omeprazole (PRILOSEC) 20 MG capsule TAKE 1 CAPSULE BY MOUTH EVERY DAY (Patient taking differently: Take 20 mg by mouth daily. )   potassium chloride (KLOR-CON) 10  MEQ tablet Take 4 tablets (40 mEq total) by mouth daily for 7 days.   No facility-administered medications prior to visit.    Review of Systems  Constitutional: Positive for fatigue. Negative for appetite change, chills and fever.  Respiratory: Negative for cough, chest tightness and shortness of breath.   Cardiovascular: Negative for chest pain and palpitations.  Gastrointestinal: Negative for abdominal pain, nausea and vomiting.  Neurological: Positive for weakness. Negative for dizziness.      Objective    BP 127/70    Physical Exam  Awake, alert, oriented x 3. In no apparent distress    Assessment & Plan      1. Pneumonia due to COVID-19 virus Prolonged complicated hospitalization but now feeling much better. Continue on inhalers and supplemental oxygen. Will follow up in office in 1-2 weeks to assess need for continued respiratory treatments.   2. Weakness of both lower extremities  - Ambulatory referral to Home Health; Future  3. Iron deficiency anemia due to chronic blood loss Secondary to menorrhagia. No sign of GI blood loss.  - CBC; Future  4. Hypokalemia  - Comprehensive Metabolic Panel (CMET); Future  5. Hypomagnesemia  - Magnesium; Future  6. Dyspnea on exertion  - Brain natriuretic peptide; Future  Above labs to be done at in-office follow up 1-2 weeks.   7. Menorrhagia with regular cycle   Other orders - lisinopril (ZESTRIL) 20 MG tablet; Take 20 mg by mouth daily.        I discussed the assessment and treatment plan with the patient. The patient was provided an opportunity to ask questions and all were answered. The patient agreed with the plan and demonstrated an understanding of the instructions.   The patient was advised to call back or seek an in-person evaluation if the symptoms worsen or if the condition fails to improve as anticipated.  I provided 14 minutes of non-face-to-face time during this encounter.  The entirety of the  information documented in the History of Present Illness, Review of Systems and Physical Exam were personally obtained by me. Portions of this information were initially documented by the CMA and reviewed by me for thoroughness and accuracy.     Video connection was lost when more than 50% of the duration of the visit was complete, at which time the remainder of the visit was completed via audio only.   Lelon Huh, MD [facility] [phone] (phone) [phone] (fax)  [place]

## 2020-04-21 NOTE — Progress Notes (Deleted)
Established patient visit   Patient: Diane Keller   DOB: 22-Apr-1976   44 y.o. Female  MRN: [id number] Visit Date: 04/21/2020  Today's healthcare provider: Mila Merry, MD   No chief complaint on file.  Subjective    HPI Follow up Hospitalization  Patient was admitted to [facility] on 03/26/2020 and discharged on 04/16/2020.                        She was treated for Renal failure,  Acute hypoxemic respiratory failure, Pneumonia due to COVID-19 virus and Iron deficiency anemia due to chronic blood loss. Treatment for this included Remdesivir.  She was given IV Lasix for elevated BNP and her condition improved.  Upon discharge she was to continue Lasix 40 mg daily for 7 days and follow up family doctor within a week to decide if she needs additional Lasix. Prior to discharged she completed 10 days of dexamethasone. She received IV iron and PRBC transfusion for treatment of Anemia, and was prescribed ferrous sulfate 325 mg daily for a month.  Per discharge summary patient should continue home oxygen at 2 L. Patient was prescribed oral potassium and oral magnesium for Hypokalemia and Hypomagnesemia.  Telephone follow up was not done. She reports {excellent/good/fair:19992} compliance with treatment. She reports this condition is {resolved/improved/worsened:23923}.  ----------------------------------------------------------------------------------------- -   {Show patient history (optional):23778::" "}   Medications: Outpatient Medications Prior to Visit  Medication Sig  . albuterol (VENTOLIN HFA) 108 (90 Base) MCG/ACT inhaler Inhale 1 puff into the lungs every 4 (four) hours.  Marland Kitchen amLODipine (NORVASC) 10 MG tablet Take 1 tablet (10 mg total) by mouth daily.  . B Complex-C (B-COMPLEX WITH VITAMIN C) tablet Take 1 tablet by mouth daily for 10 days.  . Buprenorphine HCl-Naloxone HCl (SUBOXONE) 8-2 MG FILM Place 2 Film under the tongue daily.  . ferrous sulfate 325 (65 FE) MG EC tablet  Take 1 tablet (325 mg total) by mouth daily with breakfast.  . fluticasone (FLOVENT HFA) 220 MCG/ACT inhaler Inhale 2 puffs into the lungs 2 (two) times daily.  . furosemide (LASIX) 40 MG tablet Take 1 tablet (40 mg total) by mouth daily for 7 days.  Marland Kitchen ipratropium (ATROVENT HFA) 17 MCG/ACT inhaler Inhale 2 puffs into the lungs every 4 (four) hours.  Marland Kitchen labetalol (NORMODYNE) 300 MG tablet TAKE 1 TABLET (300 MG TOTAL) BY MOUTH 2 (TWO) TIMES DAILY.  . Magnesium Chloride 64 MG TABS Take 128 mg by mouth in the morning and at bedtime for 7 days.  Marland Kitchen omeprazole (PRILOSEC) 20 MG capsule TAKE 1 CAPSULE BY MOUTH EVERY DAY (Patient taking differently: Take 20 mg by mouth daily. )  . potassium chloride (KLOR-CON) 10 MEQ tablet Take 4 tablets (40 mEq total) by mouth daily for 7 days.   No facility-administered medications prior to visit.    Review of Systems  Constitutional: Negative for appetite change, chills, fatigue and fever.  Respiratory: Negative for chest tightness and shortness of breath.   Cardiovascular: Negative for chest pain and palpitations.  Gastrointestinal: Negative for abdominal pain, nausea and vomiting.  Neurological: Negative for dizziness and weakness.    {Heme  Chem  Endocrine  Serology  Results Review (optional):23779::" "}  Objective    There were no vitals taken for this visit. {Show previous vital signs (optional):23777::" "}  Physical Exam  ***  No results found for any visits on 04/21/20.  Assessment & Plan     ***  No follow-ups on file.      {provider attestation***:1}   Donald Fisher, MD  [facility] [phone] (phone) [phone] (fax)  [facility] 

## 2020-04-27 ENCOUNTER — Telehealth: Payer: Self-pay | Admitting: Family Medicine

## 2020-04-29 NOTE — Progress Notes (Signed)
I,Roshena L Chambers,acting as a scribe for Mila Merry, MD.,have documented all relevant documentation on the behalf of Mila Merry, MD,as directed by  Mila Merry, MD while in the presence of Mila Merry, MD.  Established patient visit   Patient: Diane Keller   DOB: [birth year]   44 y.o. Female  MRN: [id number] Visit Date: 05/01/2020  Today's healthcare provider: Mila Merry, MD   Chief Complaint  Patient presents with  . Pneumonia    follow up    Subjective    HPI Follow up for Pneumonia due to COVID:  The patient was last seen for this 10 days ago via virtual visit. Had prolonged hospital course.  Changes made at last visit include patient advised to continue inhalers and supplemental oxygen, and to follow up in office in 1-2 weeks to assess need for continued respiratory treatments.   She reports good compliance with treatment. She feels that condition is Improved. Still gets very fatigued and short of breath when ambulating for prolonged periods, but otherwise is feeling much better. Having no symptoms at rest.' She is not having side effects.   -----------------------------------------------------------------------------------------       Medications: Outpatient Medications Prior to Visit  Medication Sig  . albuterol (VENTOLIN HFA) 108 (90 Base) MCG/ACT inhaler Inhale 1 puff into the lungs every 4 (four) hours.  Marland Kitchen amLODipine (NORVASC) 10 MG tablet Take 1 tablet (10 mg total) by mouth daily.  . Buprenorphine HCl-Naloxone HCl (SUBOXONE) 8-2 MG FILM Place 2 Film under the tongue daily.  . ferrous sulfate 325 (65 FE) MG EC tablet Take 1 tablet (325 mg total) by mouth daily with breakfast.  . fluticasone (FLOVENT HFA) 220 MCG/ACT inhaler Inhale 2 puffs into the lungs 2 (two) times daily.  Marland Kitchen ipratropium (ATROVENT HFA) 17 MCG/ACT inhaler Inhale 2 puffs into the lungs every 4 (four) hours.  Marland Kitchen labetalol (NORMODYNE) 300 MG tablet TAKE 1 TABLET (300 MG TOTAL)  BY MOUTH 2 (TWO) TIMES DAILY.  Marland Kitchen lisinopril (ZESTRIL) 20 MG tablet Take 20 mg by mouth daily.  Marland Kitchen omeprazole (PRILOSEC) 20 MG capsule TAKE 1 CAPSULE BY MOUTH EVERY DAY (Patient taking differently: Take 20 mg by mouth daily. )  . [DISCONTINUED] furosemide (LASIX) 40 MG tablet Take 1 tablet (40 mg total) by mouth daily for 7 days.  . [DISCONTINUED] potassium chloride (KLOR-CON) 10 MEQ tablet Take 4 tablets (40 mEq total) by mouth daily for 7 days.   No facility-administered medications prior to visit.    Review of Systems  Constitutional: Negative.  Negative for appetite change, chills, fatigue and fever.  Respiratory: Negative.  Negative for chest tightness and shortness of breath.   Cardiovascular: Negative.  Negative for chest pain and palpitations.  Gastrointestinal: Negative for abdominal pain, nausea and vomiting.  Musculoskeletal: Negative.   Neurological: Negative for dizziness and weakness.      Objective    BP 108/82 (BP Location: Left Arm, Patient Position: Sitting, Cuff Size: Large)   Pulse 87   Temp (!) 97.1 F (36.2 C) (Temporal)   Resp 18   Wt 230 lb (104.3 kg)   SpO2 99% Comment: 2L 02 nasal canula  BMI 37.12 kg/m    Sp02 89% room air ambulating     Physical Exam    General: Appearance:    Mildly obese female in no acute distress  Eyes:    PERRL, conjunctiva/corneas clear, EOM's intact       Lungs:     Clear to auscultation bilaterally,  respirations unlabored  Heart:    Normal heart rate. Normal rhythm. No murmurs, rubs, or gallops.   MS:   All extremities are intact.   Neurologic:   Awake, alert, oriented x 3. No apparent focal neurological           defect.        No results found for any visits on 05/01/20.  Assessment & Plan     1. Pneumonia due to COVID-19 virus Had severe Covid with prolonged hospitalization with new long term lung disease. She still requires oxygen with ambulation and continued inhalers, but anticipated slow but steady  improvement over the next few months.   2. Iron deficiency anemia due to chronic blood loss  - CBC  3. Dyspnea on exertion/hypoxemia/chronic respiratory failure Advised she  No longer required continuous O2 supplementation but she continues to require oxygen with exertion. Will send new order go [facility].  - Brain natriuretic peptide  4. Hypomagnesemia  - Magnesium  5. Hypokalemia  - Comprehensive Metabolic Panel (CMET)  6. Hypertension Was previously requiring lisinopril but added labetalol in the hospital which she is tolerating well and she would like to cut back on  lisinopril (ZESTRIL) 20 MG tablet, will reduce to  0.5 tablets (10 mg total) by mouth daily.   No follow-ups on file.         Lelon Huh, MD  [facility] [phone] (phone) [phone] (fax)  [place]

## 2020-05-01 ENCOUNTER — Other Ambulatory Visit: Payer: Self-pay

## 2020-05-01 ENCOUNTER — Encounter: Payer: Self-pay | Admitting: Family Medicine

## 2020-05-01 ENCOUNTER — Ambulatory Visit (INDEPENDENT_AMBULATORY_CARE_PROVIDER_SITE_OTHER): Payer: Medicaid Other | Admitting: Family Medicine

## 2020-05-01 VITALS — BP 108/82 | HR 87 | Temp 97.1°F | Resp 18 | Wt 230.0 lb

## 2020-05-01 DIAGNOSIS — D500 Iron deficiency anemia secondary to blood loss (chronic): Secondary | ICD-10-CM

## 2020-05-01 DIAGNOSIS — R0600 Dyspnea, unspecified: Secondary | ICD-10-CM | POA: Diagnosis not present

## 2020-05-01 DIAGNOSIS — I10 Essential (primary) hypertension: Secondary | ICD-10-CM | POA: Diagnosis not present

## 2020-05-01 DIAGNOSIS — E876 Hypokalemia: Secondary | ICD-10-CM

## 2020-05-01 DIAGNOSIS — J1282 Pneumonia due to coronavirus disease 2019: Secondary | ICD-10-CM

## 2020-05-01 DIAGNOSIS — U071 COVID-19: Secondary | ICD-10-CM

## 2020-05-01 DIAGNOSIS — J9611 Chronic respiratory failure with hypoxia: Secondary | ICD-10-CM | POA: Diagnosis not present

## 2020-05-01 DIAGNOSIS — R0609 Other forms of dyspnea: Secondary | ICD-10-CM

## 2020-05-01 MED ORDER — LISINOPRIL 20 MG PO TABS: 10.0000 mg | ORAL_TABLET | Freq: Every day | ORAL | Status: DC

## 2020-05-02 LAB — COMPREHENSIVE METABOLIC PANEL
ALT: 5 IU/L (ref 0–32)
AST: 10 IU/L (ref 0–40)
Albumin/Globulin Ratio: 1.1 — ABNORMAL LOW (ref 1.2–2.2)
Albumin: 3.6 g/dL — ABNORMAL LOW (ref 3.8–4.8)
Alkaline Phosphatase: 94 IU/L (ref 48–121)
BUN/Creatinine Ratio: 11 (ref 9–23)
BUN: 10 mg/dL (ref 6–24)
Bilirubin Total: <0.2 mg/dL (ref 0.0–1.2)
CO2: 21 mmol/L (ref 20–29)
Calcium: 9.2 mg/dL (ref 8.7–10.2)
Chloride: 103 mmol/L (ref 96–106)
Creatinine, Ser: 0.94 mg/dL (ref 0.57–1.00)
GFR calc Af Amer: 84 mL/min/{1.73_m2} (ref >59)
GFR calc non Af Amer: 73 mL/min/{1.73_m2} (ref >59)
Globulin, Total: 3.2 g/dL (ref 1.5–4.5)
Glucose: 94 mg/dL (ref 65–99)
Potassium: 4.4 mmol/L (ref 3.5–5.2)
Sodium: 139 mmol/L (ref 134–144)
Total Protein: 6.8 g/dL (ref 6.0–8.5)

## 2020-05-02 LAB — CBC
Hematocrit: 28.9 % — ABNORMAL LOW (ref 34.0–46.6)
Hemoglobin: 8.7 g/dL — ABNORMAL LOW (ref 11.1–15.9)
MCH: 24.1 pg — ABNORMAL LOW (ref 26.6–33.0)
MCHC: 30.1 g/dL — ABNORMAL LOW (ref 31.5–35.7)
MCV: 80 fL (ref 79–97)
Platelets: 329 10*3/uL (ref 150–450)
RBC: 3.61 x10E6/uL — ABNORMAL LOW (ref 3.77–5.28)
RDW: 26.6 % — ABNORMAL HIGH (ref 11.7–15.4)
WBC: 7.2 10*3/uL (ref 3.4–10.8)

## 2020-05-02 LAB — BRAIN NATRIURETIC PEPTIDE: BNP: 16.5 pg/mL (ref 0.0–100.0)

## 2020-05-02 LAB — MAGNESIUM: Magnesium: 1.5 mg/dL — ABNORMAL LOW (ref 1.6–2.3)

## 2020-05-04 ENCOUNTER — Telehealth: Payer: Self-pay | Admitting: Family Medicine

## 2020-05-04 NOTE — Telephone Encounter (Signed)
Heather calling from [facility] is calling to report that they do not have any Portable Oxygen Concentrator. [facility] POC are on back order. Diane Keller does not feel comfortable taking any scripts for these at this time. Since it is a timely request. Diane Keller [phone]  Please advise with the patient Cb- [phone]  Diane Keller states [facility] [place] [facility] may be able to assist and they do deliver

## 2020-05-04 NOTE — Telephone Encounter (Signed)
Maralyn Sago,   Do you know an local home health agencies that have portable oxygen concentrators we could order for this patient?

## 2020-05-07 NOTE — Telephone Encounter (Signed)
Not local but maybe try [facility] in [place] [facility] [place]

## 2020-05-11 NOTE — Telephone Encounter (Signed)
[facility] in [place] does have portable O2. Can fax order to [phone]

## 2020-05-11 NOTE — Telephone Encounter (Signed)
Order faxed to [facility] in [place]

## 2020-05-11 NOTE — Telephone Encounter (Signed)
Can you call [facility] in [place] and see if they have portable oxygen. I guess nobody in [place] Has them.

## 2020-05-11 NOTE — Telephone Encounter (Signed)
OK, order printed and ready to fax

## 2020-05-12 NOTE — Telephone Encounter (Signed)
Well, I just don't know what to do about this. The patient needs a portable oxygen tank but [facility] apparently does not have any, so we were just calling round trying to find some we could order one from. What are we supposed to do?

## 2020-05-12 NOTE — Telephone Encounter (Signed)
Contacted Gilda with [facility] to see if she knew what we could do to get the portable oxygen. Gilda states insurance will not pay for a patient to have portable oxygen concentrator for COVID related issues.

## 2020-05-12 NOTE — Telephone Encounter (Signed)
Gilda, from [place], called stating that the pt is not a pt of theirs and does not recieve oxygen from their facility. Rod Holler states that in the order notes [facility] was listed. Please advise.     [phone]

## 2020-05-14 DIAGNOSIS — J1282 Pneumonia due to coronavirus disease 2019: Secondary | ICD-10-CM | POA: Diagnosis not present

## 2020-05-14 DIAGNOSIS — U071 COVID-19: Secondary | ICD-10-CM | POA: Diagnosis not present

## 2020-05-14 DIAGNOSIS — J9601 Acute respiratory failure with hypoxia: Secondary | ICD-10-CM | POA: Diagnosis not present

## 2020-05-27 ENCOUNTER — Telehealth: Payer: Self-pay

## 2020-05-27 NOTE — Telephone Encounter (Signed)
Copied from CRM [id number]. Topic: General - Other >> May 27, 2020  3:54 PM Dalphine Handing A wrote: Patient is requesting a callback for a stusts update on a portable oxygen take that she was to have ordered.

## 2020-05-29 NOTE — Telephone Encounter (Signed)
Patient advised Rod Holler  With [employer] will not pay for a patient to have portable oxygen concentrator for COVID related issues.

## 2020-06-02 ENCOUNTER — Ambulatory Visit: Payer: Medicaid Other | Admitting: Family Medicine

## 2020-06-02 NOTE — Progress Notes (Deleted)
     Established patient visit   Patient: Diane Keller   DOB: Jun 27, 1976   44 y.o. Female  MRN: [id number] Visit Date: 06/02/2020  Today's healthcare provider: Mila Merry, MD   No chief complaint on file.  Subjective    HPI  Follow up for Hypomagnesemia:  The patient was last seen for this 1 months ago. Changes made at last visit include starting OTC magnesium oxide 250mg  twice a day due to low magnesium levels.     She reports {excellent/good/fair/poor:19665} compliance with treatment. She feels that condition is {improved/worse/unchanged:3041574}. She {is/is not:21021397} having side effects. ***  -----------------------------------------------------------------------------------------  Follow up for Anemia:  The patient was last seen for this 1 months ago. Changes made at last visit include starting OTC iron sulfate 325mg  once a day due to low iron levels and low blood count.  She reports {excellent/good/fair/poor:19665} compliance with treatment. She feels that condition is {improved/worse/unchanged:3041574}. She {is/is not:21021397} having side effects. ***  -----------------------------------------------------------------------------------------    {Show patient history (optional):23778::" "}   Medications: Outpatient Medications Prior to Visit  Medication Sig  . albuterol (VENTOLIN HFA) 108 (90 Base) MCG/ACT inhaler Inhale 1 puff into the lungs every 4 (four) hours.  amLODipine (NORVASC) 10 MG tablet Take 1 tablet (10 mg total) by mouth daily.  . Buprenorphine HCl-Naloxone HCl (SUBOXONE) 8-2 MG FILM Place 2 Film under the tongue daily.  . ferrous sulfate 325 (65 FE) MG EC tablet Take 1 tablet (325 mg total) by mouth daily with breakfast.  . fluticasone (FLOVENT HFA) 220 MCG/ACT inhaler Inhale 2 puffs into the lungs 2 (two) times daily.  ipratropium (ATROVENT HFA) 17 MCG/ACT inhaler Inhale 2 puffs into the lungs every 4 (four) hours.  Marland Kitchen labetalol  (NORMODYNE) 300 MG tablet TAKE 1 TABLET (300 MG TOTAL) BY MOUTH 2 (TWO) TIMES DAILY.  Marland Kitchen lisinopril (ZESTRIL) 20 MG tablet Take 0.5 tablets (10 mg total) by mouth daily.  Marland Kitchen omeprazole (PRILOSEC) 20 MG capsule TAKE 1 CAPSULE BY MOUTH EVERY DAY (Patient taking differently: Take 20 mg by mouth daily. )   No facility-administered medications prior to visit.    Review of Systems  Constitutional: Negative for appetite change, chills, fatigue and fever.  Respiratory: Negative for chest tightness and shortness of breath.   Cardiovascular: Negative for chest pain and palpitations.  Gastrointestinal: Negative for abdominal pain, nausea and vomiting.  Neurological: Negative for dizziness and weakness.    {Heme  Chem  Endocrine  Serology  Results Review (optional):23779::" "}  Objective    There were no vitals taken for this visit. {Show previous vital signs (optional):23777::" "}  Physical Exam  ***  No results found for any visits on 06/02/20.  Assessment & Plan     ***  No follow-ups on file.      {provider attestation***:1}   Marland Kitchen, MD  [facility] [phone] (phone) [phone] (fax)  [facility]

## 2020-06-14 DIAGNOSIS — J9601 Acute respiratory failure with hypoxia: Secondary | ICD-10-CM | POA: Diagnosis not present

## 2020-06-14 DIAGNOSIS — J1282 Pneumonia due to coronavirus disease 2019: Secondary | ICD-10-CM | POA: Diagnosis not present

## 2020-06-14 DIAGNOSIS — U071 COVID-19: Secondary | ICD-10-CM | POA: Diagnosis not present

## 2020-07-01 ENCOUNTER — Other Ambulatory Visit: Payer: Self-pay | Admitting: Family Medicine

## 2020-07-01 DIAGNOSIS — I10 Essential (primary) hypertension: Secondary | ICD-10-CM

## 2020-07-15 DIAGNOSIS — J1282 Pneumonia due to coronavirus disease 2019: Secondary | ICD-10-CM | POA: Diagnosis not present

## 2020-07-15 DIAGNOSIS — J9601 Acute respiratory failure with hypoxia: Secondary | ICD-10-CM | POA: Diagnosis not present

## 2020-07-15 DIAGNOSIS — U071 COVID-19: Secondary | ICD-10-CM | POA: Diagnosis not present

## 2020-07-16 DIAGNOSIS — Z79899 Other long term (current) drug therapy: Secondary | ICD-10-CM | POA: Diagnosis not present

## 2020-07-16 DIAGNOSIS — Z20828 Contact with and (suspected) exposure to other viral communicable diseases: Secondary | ICD-10-CM | POA: Diagnosis not present

## 2020-07-16 DIAGNOSIS — R05 Cough: Secondary | ICD-10-CM | POA: Diagnosis not present

## 2020-07-22 ENCOUNTER — Other Ambulatory Visit: Payer: Self-pay | Admitting: Family Medicine

## 2020-07-22 NOTE — Telephone Encounter (Signed)
Requested medication (s) are due for refill today: yes  Requested medication (s) are on the active medication list: yes   Last refill:  04/16/2020  Future visit scheduled:no  Notes to clinic:  filled by a different provider Review for fill    Requested Prescriptions  Pending Prescriptions Disp Refills   amLODipine (NORVASC) 10 MG tablet [Pharmacy Med Name: AMLODIPINE BESYLATE 10 MG TAB] 30 tablet 0    Sig: TAKE 1 TABLET BY MOUTH EVERY DAY      Cardiovascular:  Calcium Channel Blockers Passed - 07/22/2020 10:48 AM      Passed - Last BP in normal range    BP Readings from Last 1 Encounters:  05/01/20 108/82          Passed - Valid encounter within last 6 months    Recent Outpatient Visits           2 months ago Pneumonia due to COVID-19 virus   [facility] Malva Limes, MD   3 months ago Pneumonia due to COVID-19 virus   [facility] Fisher, Demetrios Isaacs, MD   1 year ago Essential hypertension   [facility] Malva Limes, MD   3 years ago Essential hypertension   [facility] [place], [place]

## 2020-07-23 DIAGNOSIS — R05 Cough: Secondary | ICD-10-CM | POA: Diagnosis not present

## 2020-07-23 DIAGNOSIS — Z20828 Contact with and (suspected) exposure to other viral communicable diseases: Secondary | ICD-10-CM | POA: Diagnosis not present

## 2020-07-23 DIAGNOSIS — Z79899 Other long term (current) drug therapy: Secondary | ICD-10-CM | POA: Diagnosis not present

## 2020-07-30 DIAGNOSIS — Z20828 Contact with and (suspected) exposure to other viral communicable diseases: Secondary | ICD-10-CM | POA: Diagnosis not present

## 2020-07-30 DIAGNOSIS — R05 Cough: Secondary | ICD-10-CM | POA: Diagnosis not present

## 2020-07-30 DIAGNOSIS — Z79899 Other long term (current) drug therapy: Secondary | ICD-10-CM | POA: Diagnosis not present

## 2020-08-06 DIAGNOSIS — Z20828 Contact with and (suspected) exposure to other viral communicable diseases: Secondary | ICD-10-CM | POA: Diagnosis not present

## 2020-08-06 DIAGNOSIS — I10 Essential (primary) hypertension: Secondary | ICD-10-CM | POA: Diagnosis not present

## 2020-08-06 DIAGNOSIS — Z79899 Other long term (current) drug therapy: Secondary | ICD-10-CM | POA: Diagnosis not present

## 2020-08-13 DIAGNOSIS — Z20828 Contact with and (suspected) exposure to other viral communicable diseases: Secondary | ICD-10-CM | POA: Diagnosis not present

## 2020-08-13 DIAGNOSIS — J3089 Other allergic rhinitis: Secondary | ICD-10-CM | POA: Diagnosis not present

## 2020-08-13 DIAGNOSIS — F411 Generalized anxiety disorder: Secondary | ICD-10-CM | POA: Diagnosis not present

## 2020-08-13 DIAGNOSIS — F4321 Adjustment disorder with depressed mood: Secondary | ICD-10-CM | POA: Diagnosis not present

## 2020-08-13 DIAGNOSIS — Z79899 Other long term (current) drug therapy: Secondary | ICD-10-CM | POA: Diagnosis not present

## 2020-08-13 DIAGNOSIS — I10 Essential (primary) hypertension: Secondary | ICD-10-CM | POA: Diagnosis not present

## 2020-08-14 DIAGNOSIS — J1282 Pneumonia due to coronavirus disease 2019: Secondary | ICD-10-CM | POA: Diagnosis not present

## 2020-08-14 DIAGNOSIS — U071 COVID-19: Secondary | ICD-10-CM | POA: Diagnosis not present

## 2020-08-14 DIAGNOSIS — J9601 Acute respiratory failure with hypoxia: Secondary | ICD-10-CM | POA: Diagnosis not present

## 2020-08-17 ENCOUNTER — Other Ambulatory Visit: Payer: Self-pay | Admitting: Family Medicine

## 2020-08-17 DIAGNOSIS — R12 Heartburn: Secondary | ICD-10-CM

## 2020-08-20 DIAGNOSIS — Z79899 Other long term (current) drug therapy: Secondary | ICD-10-CM | POA: Diagnosis not present

## 2020-08-20 DIAGNOSIS — F411 Generalized anxiety disorder: Secondary | ICD-10-CM | POA: Diagnosis not present

## 2020-08-20 DIAGNOSIS — F4321 Adjustment disorder with depressed mood: Secondary | ICD-10-CM | POA: Diagnosis not present

## 2020-08-20 DIAGNOSIS — J3089 Other allergic rhinitis: Secondary | ICD-10-CM | POA: Diagnosis not present

## 2020-08-20 DIAGNOSIS — I10 Essential (primary) hypertension: Secondary | ICD-10-CM | POA: Diagnosis not present

## 2020-08-20 DIAGNOSIS — Z20828 Contact with and (suspected) exposure to other viral communicable diseases: Secondary | ICD-10-CM | POA: Diagnosis not present

## 2020-08-27 DIAGNOSIS — Z20828 Contact with and (suspected) exposure to other viral communicable diseases: Secondary | ICD-10-CM | POA: Diagnosis not present

## 2020-08-27 DIAGNOSIS — J3089 Other allergic rhinitis: Secondary | ICD-10-CM | POA: Diagnosis not present

## 2020-08-27 DIAGNOSIS — Z79899 Other long term (current) drug therapy: Secondary | ICD-10-CM | POA: Diagnosis not present

## 2020-08-27 DIAGNOSIS — F4321 Adjustment disorder with depressed mood: Secondary | ICD-10-CM | POA: Diagnosis not present

## 2020-08-27 DIAGNOSIS — F411 Generalized anxiety disorder: Secondary | ICD-10-CM | POA: Diagnosis not present

## 2020-08-27 DIAGNOSIS — I10 Essential (primary) hypertension: Secondary | ICD-10-CM | POA: Diagnosis not present

## 2020-09-03 DIAGNOSIS — I10 Essential (primary) hypertension: Secondary | ICD-10-CM | POA: Diagnosis not present

## 2020-09-03 DIAGNOSIS — F411 Generalized anxiety disorder: Secondary | ICD-10-CM | POA: Diagnosis not present

## 2020-09-03 DIAGNOSIS — J3089 Other allergic rhinitis: Secondary | ICD-10-CM | POA: Diagnosis not present

## 2020-09-03 DIAGNOSIS — F4321 Adjustment disorder with depressed mood: Secondary | ICD-10-CM | POA: Diagnosis not present

## 2020-09-03 DIAGNOSIS — Z20828 Contact with and (suspected) exposure to other viral communicable diseases: Secondary | ICD-10-CM | POA: Diagnosis not present

## 2020-09-03 DIAGNOSIS — Z79899 Other long term (current) drug therapy: Secondary | ICD-10-CM | POA: Diagnosis not present

## 2020-09-10 DIAGNOSIS — Z20828 Contact with and (suspected) exposure to other viral communicable diseases: Secondary | ICD-10-CM | POA: Diagnosis not present

## 2020-09-10 DIAGNOSIS — I10 Essential (primary) hypertension: Secondary | ICD-10-CM | POA: Diagnosis not present

## 2020-09-10 DIAGNOSIS — F4321 Adjustment disorder with depressed mood: Secondary | ICD-10-CM | POA: Diagnosis not present

## 2020-09-10 DIAGNOSIS — F411 Generalized anxiety disorder: Secondary | ICD-10-CM | POA: Diagnosis not present

## 2020-09-10 DIAGNOSIS — Z79899 Other long term (current) drug therapy: Secondary | ICD-10-CM | POA: Diagnosis not present

## 2020-09-10 DIAGNOSIS — J3089 Other allergic rhinitis: Secondary | ICD-10-CM | POA: Diagnosis not present

## 2020-09-14 DIAGNOSIS — U071 COVID-19: Secondary | ICD-10-CM | POA: Diagnosis not present

## 2020-09-14 DIAGNOSIS — J1282 Pneumonia due to coronavirus disease 2019: Secondary | ICD-10-CM | POA: Diagnosis not present

## 2020-09-14 DIAGNOSIS — J9601 Acute respiratory failure with hypoxia: Secondary | ICD-10-CM | POA: Diagnosis not present

## 2020-09-17 DIAGNOSIS — J3089 Other allergic rhinitis: Secondary | ICD-10-CM | POA: Diagnosis not present

## 2020-09-17 DIAGNOSIS — I10 Essential (primary) hypertension: Secondary | ICD-10-CM | POA: Diagnosis not present

## 2020-09-17 DIAGNOSIS — F4321 Adjustment disorder with depressed mood: Secondary | ICD-10-CM | POA: Diagnosis not present

## 2020-09-17 DIAGNOSIS — Z79899 Other long term (current) drug therapy: Secondary | ICD-10-CM | POA: Diagnosis not present

## 2020-09-17 DIAGNOSIS — Z20828 Contact with and (suspected) exposure to other viral communicable diseases: Secondary | ICD-10-CM | POA: Diagnosis not present

## 2020-09-17 DIAGNOSIS — F411 Generalized anxiety disorder: Secondary | ICD-10-CM | POA: Diagnosis not present

## 2020-09-24 DIAGNOSIS — J3089 Other allergic rhinitis: Secondary | ICD-10-CM | POA: Diagnosis not present

## 2020-09-24 DIAGNOSIS — F4321 Adjustment disorder with depressed mood: Secondary | ICD-10-CM | POA: Diagnosis not present

## 2020-09-24 DIAGNOSIS — I10 Essential (primary) hypertension: Secondary | ICD-10-CM | POA: Diagnosis not present

## 2020-09-24 DIAGNOSIS — F411 Generalized anxiety disorder: Secondary | ICD-10-CM | POA: Diagnosis not present

## 2020-09-24 DIAGNOSIS — Z20828 Contact with and (suspected) exposure to other viral communicable diseases: Secondary | ICD-10-CM | POA: Diagnosis not present

## 2020-09-24 DIAGNOSIS — Z79899 Other long term (current) drug therapy: Secondary | ICD-10-CM | POA: Diagnosis not present

## 2020-10-01 DIAGNOSIS — F411 Generalized anxiety disorder: Secondary | ICD-10-CM | POA: Diagnosis not present

## 2020-10-01 DIAGNOSIS — F4321 Adjustment disorder with depressed mood: Secondary | ICD-10-CM | POA: Diagnosis not present

## 2020-10-01 DIAGNOSIS — Z20828 Contact with and (suspected) exposure to other viral communicable diseases: Secondary | ICD-10-CM | POA: Diagnosis not present

## 2020-10-01 DIAGNOSIS — J3089 Other allergic rhinitis: Secondary | ICD-10-CM | POA: Diagnosis not present

## 2020-10-01 DIAGNOSIS — Z79899 Other long term (current) drug therapy: Secondary | ICD-10-CM | POA: Diagnosis not present

## 2020-10-01 DIAGNOSIS — I10 Essential (primary) hypertension: Secondary | ICD-10-CM | POA: Diagnosis not present

## 2020-10-03 IMAGING — CT CT HEAD W/O CM
3 series · 15 of 47 positions shown, 18 images · non-contrast
Comparison: 04/02/2020

CLINICAL DATA: Altered mental status, [id number] pneumonia

EXAM:
CT HEAD WITHOUT CONTRAST
TECHNIQUE: Contiguous axial images were obtained from the base of the skull
through the vertex without intravenous contrast.

[Series 504: head wo · axial · 0.47mm/px · z∈[-195,-65]mm · 9 of 32 slices shown, 12 images]
[im 3/32  brain]
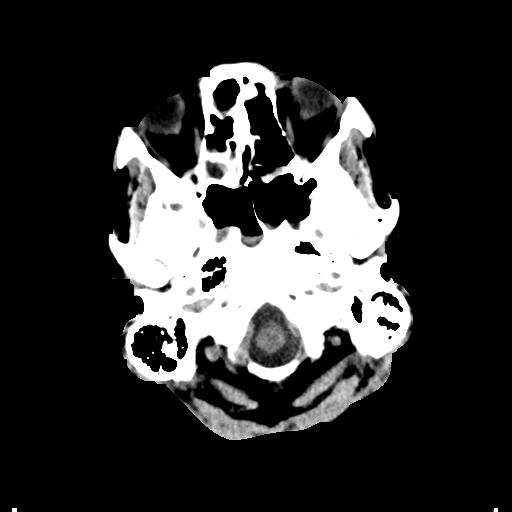
[im 3/32  bone]
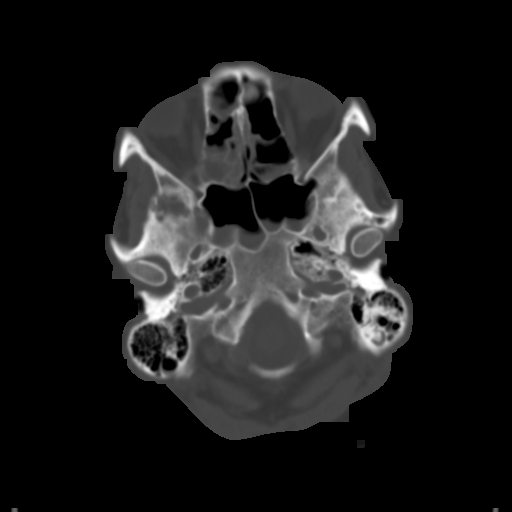
[im 6/32  brain]
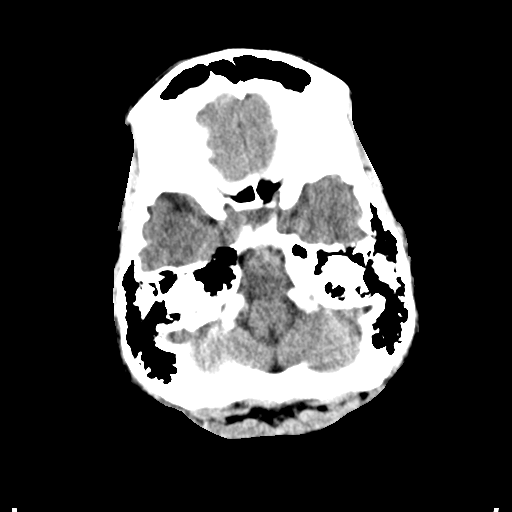
[im 9/32  brain]
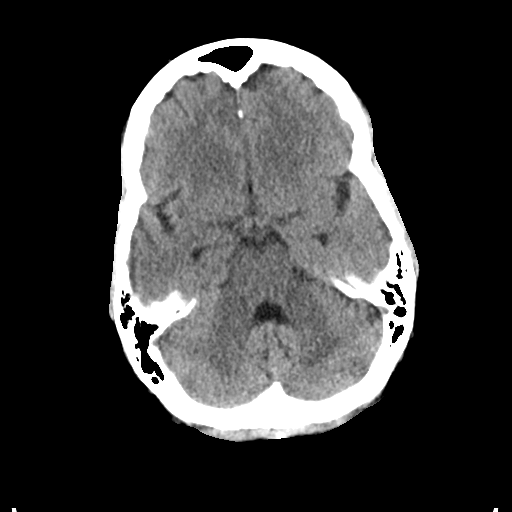
[im 12/32  brain]
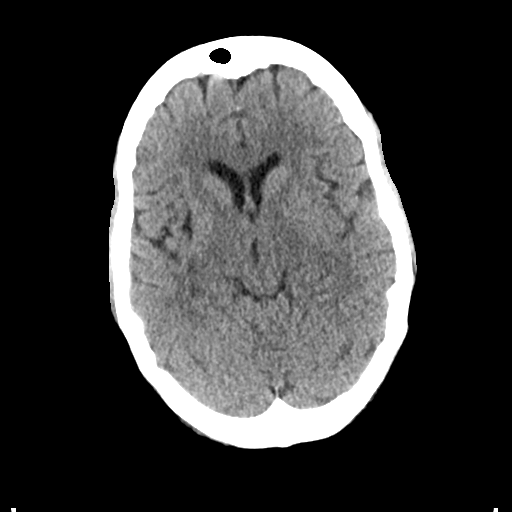
[im 17/32  brain]
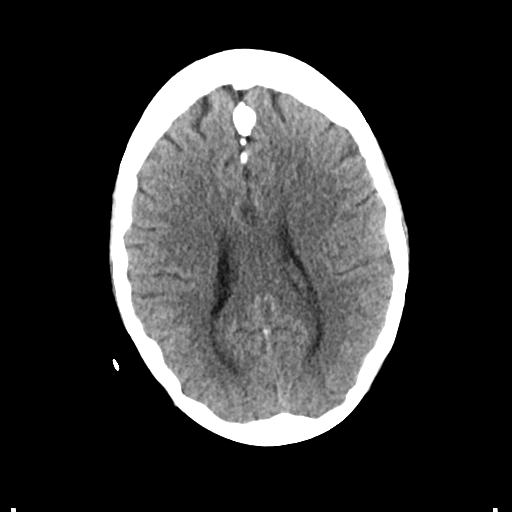
[im 17/32  bone]
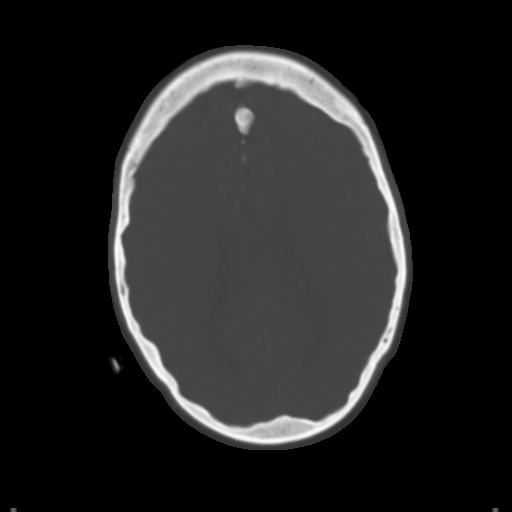
[im 20/32  brain]
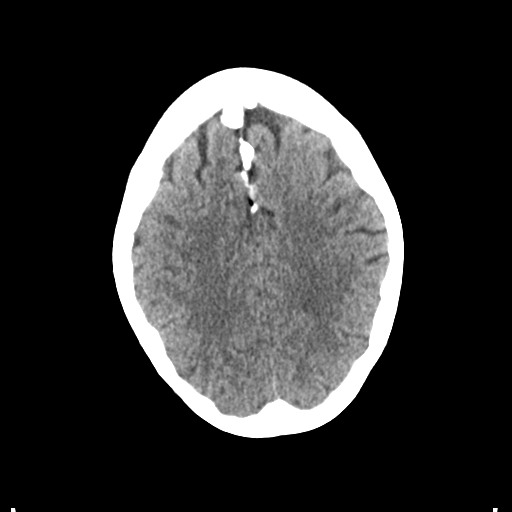
[im 23/32  brain]
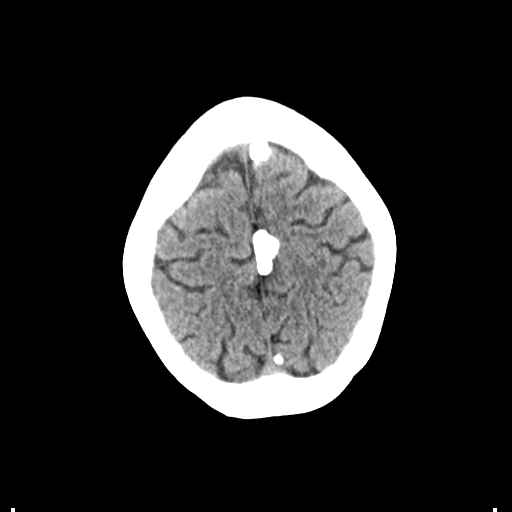
[im 26/32  brain]
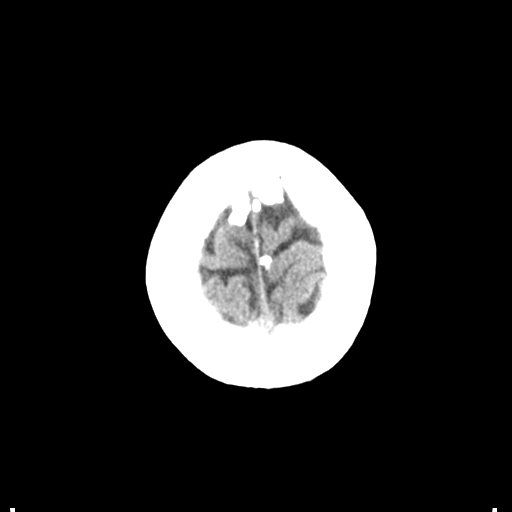
[im 29/32  brain]
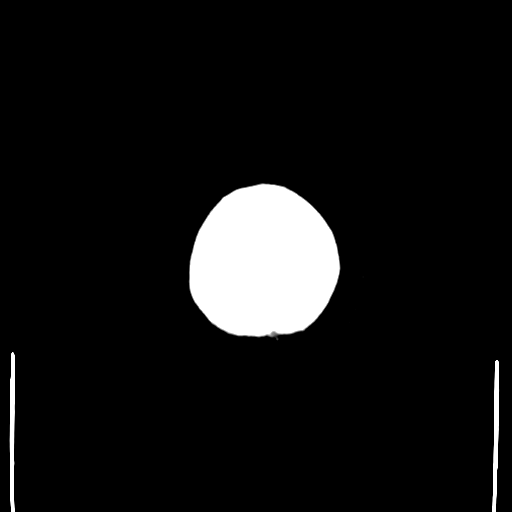
[im 29/32  bone]
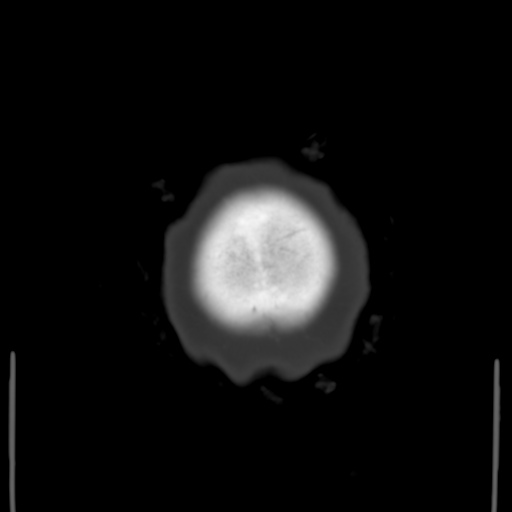

[Series 506: coronal soft tissue · coronal · 0.30mm/px · 3 of 70 slices shown]
[im 24/70  brain]
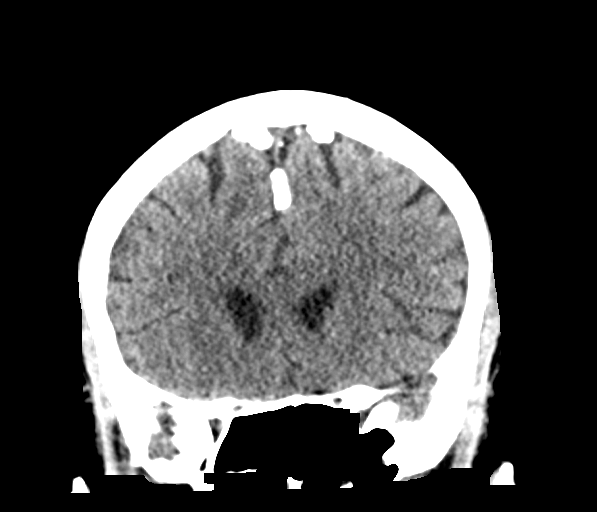
[im 31/70  brain]
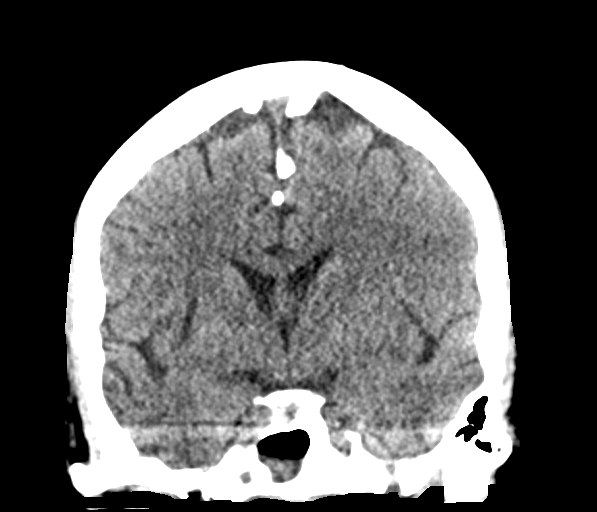
[im 39/70  brain]
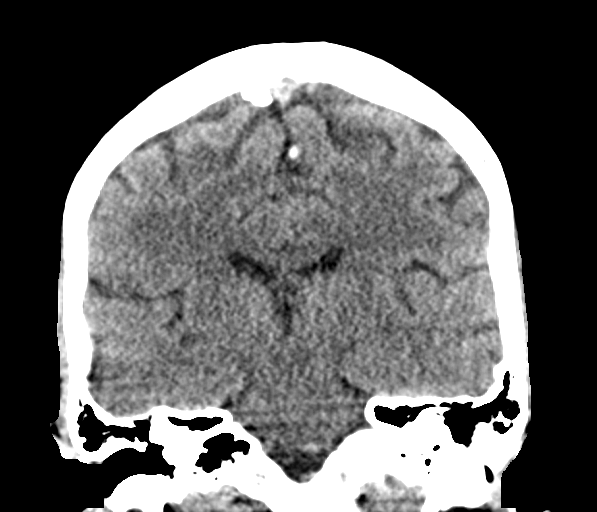

[Series 507: sagittal soft tissue · sagittal · 0.30mm/px · 3 of 61 slices shown]
[im 21/61  brain]
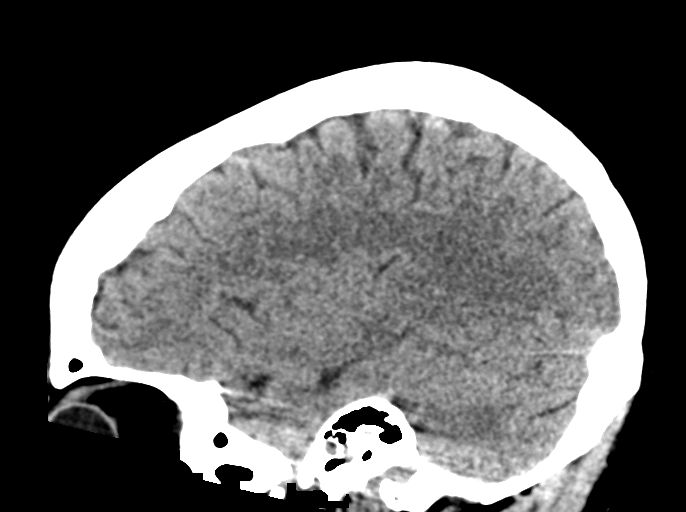
[im 31/61  brain]
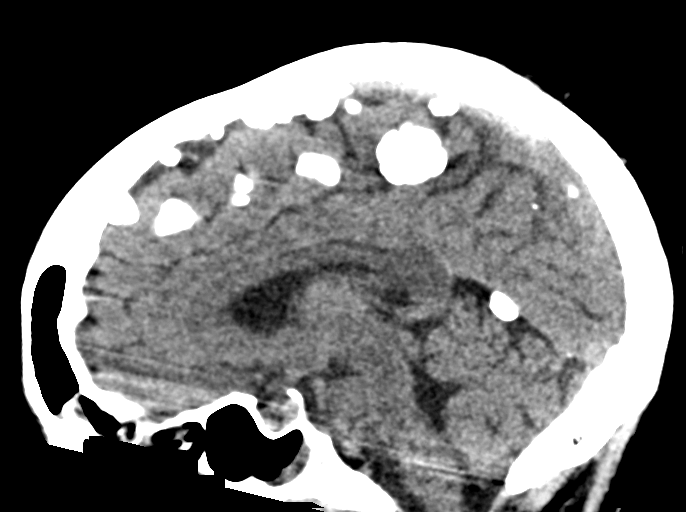
[im 41/61  brain]
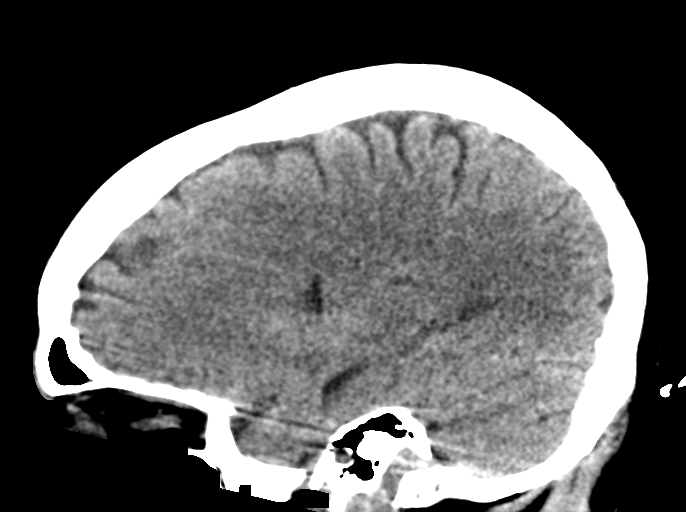

[15 of 47 positions shown; findings below may reference images not displayed]

FINDINGS: Brain: There is no acute intracranial hemorrhage, mass effect, or
edema. There is no new loss of gray-white differentiation. Small
area of encephalomalacia is again identified in the region the right
lentiform nucleus. There is no extra-axial fluid collection.
Ventricles and sulci are within normal limits in size and
configuration.

Vascular: No hyperdense vessel or unexpected calcification.

Skull: Calvarium is unremarkable.

Sinuses/Orbits: Nonspecific partial ethmoid and sphenoid sinus
opacification layering secretions. Visualized orbits are
unremarkable.

Other: Mastoid air cells are clear.
IMPRESSION: No acute intracranial abnormality. No significant change since
04/02/2020.

## 2020-10-07 DIAGNOSIS — I10 Essential (primary) hypertension: Secondary | ICD-10-CM | POA: Diagnosis not present

## 2020-10-07 DIAGNOSIS — F411 Generalized anxiety disorder: Secondary | ICD-10-CM | POA: Diagnosis not present

## 2020-10-07 DIAGNOSIS — Z79899 Other long term (current) drug therapy: Secondary | ICD-10-CM | POA: Diagnosis not present

## 2020-10-07 DIAGNOSIS — Z20828 Contact with and (suspected) exposure to other viral communicable diseases: Secondary | ICD-10-CM | POA: Diagnosis not present

## 2020-10-07 DIAGNOSIS — F4321 Adjustment disorder with depressed mood: Secondary | ICD-10-CM | POA: Diagnosis not present

## 2020-10-07 DIAGNOSIS — J3089 Other allergic rhinitis: Secondary | ICD-10-CM | POA: Diagnosis not present

## 2020-10-09 IMAGING — DX DG CHEST 1V PORT
1 series · 1 of 1 positions shown · non-contrast
Comparison: April 07, 2020

CLINICAL DATA: 57XM0-3H positive.  Shortness of breath.

EXAM:
PORTABLE CHEST 1 VIEW

[chest ap]
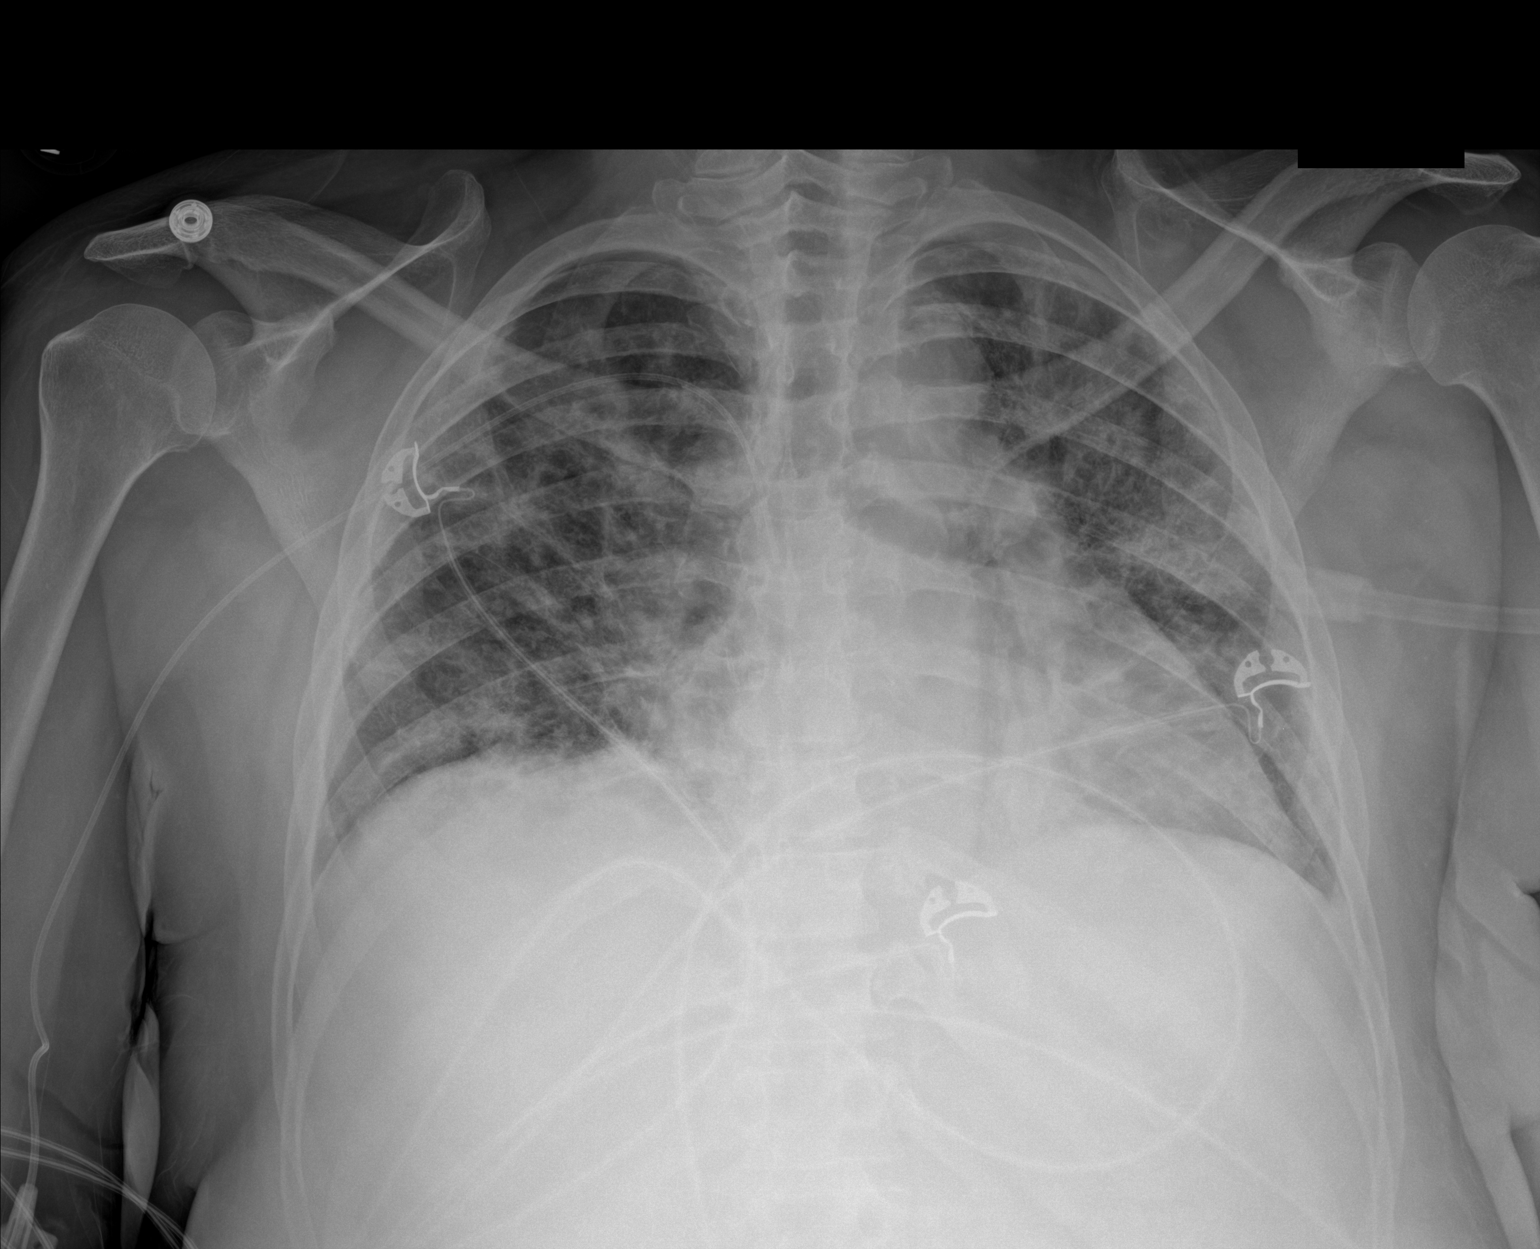

[1 of 1 positions shown; findings below may reference images not displayed]

FINDINGS: The right PICC line terminates in the central SVC. No pneumothorax.
Bilateral pulmonary infiltrates persist but have improved. No other
changes.
IMPRESSION: 1. Appropriately placed right PICC line terminating in the SVC.
2. Significant but improving bilateral pulmonary infiltrates.

## 2020-10-14 DIAGNOSIS — U071 COVID-19: Secondary | ICD-10-CM | POA: Diagnosis not present

## 2020-10-14 DIAGNOSIS — J9601 Acute respiratory failure with hypoxia: Secondary | ICD-10-CM | POA: Diagnosis not present

## 2020-10-14 DIAGNOSIS — J1282 Pneumonia due to coronavirus disease 2019: Secondary | ICD-10-CM | POA: Diagnosis not present

## 2020-10-15 DIAGNOSIS — F4321 Adjustment disorder with depressed mood: Secondary | ICD-10-CM | POA: Diagnosis not present

## 2020-10-15 DIAGNOSIS — F411 Generalized anxiety disorder: Secondary | ICD-10-CM | POA: Diagnosis not present

## 2020-10-15 DIAGNOSIS — Z20828 Contact with and (suspected) exposure to other viral communicable diseases: Secondary | ICD-10-CM | POA: Diagnosis not present

## 2020-10-15 DIAGNOSIS — I10 Essential (primary) hypertension: Secondary | ICD-10-CM | POA: Diagnosis not present

## 2020-10-15 DIAGNOSIS — J3089 Other allergic rhinitis: Secondary | ICD-10-CM | POA: Diagnosis not present

## 2020-10-15 DIAGNOSIS — Z79899 Other long term (current) drug therapy: Secondary | ICD-10-CM | POA: Diagnosis not present

## 2020-10-22 DIAGNOSIS — I10 Essential (primary) hypertension: Secondary | ICD-10-CM | POA: Diagnosis not present

## 2020-10-22 DIAGNOSIS — Z20828 Contact with and (suspected) exposure to other viral communicable diseases: Secondary | ICD-10-CM | POA: Diagnosis not present

## 2020-10-22 DIAGNOSIS — Z79899 Other long term (current) drug therapy: Secondary | ICD-10-CM | POA: Diagnosis not present

## 2020-10-22 DIAGNOSIS — J3089 Other allergic rhinitis: Secondary | ICD-10-CM | POA: Diagnosis not present

## 2020-10-22 DIAGNOSIS — F4321 Adjustment disorder with depressed mood: Secondary | ICD-10-CM | POA: Diagnosis not present

## 2020-10-22 DIAGNOSIS — F411 Generalized anxiety disorder: Secondary | ICD-10-CM | POA: Diagnosis not present

## 2020-10-29 DIAGNOSIS — F411 Generalized anxiety disorder: Secondary | ICD-10-CM | POA: Diagnosis not present

## 2020-10-29 DIAGNOSIS — F4321 Adjustment disorder with depressed mood: Secondary | ICD-10-CM | POA: Diagnosis not present

## 2020-10-29 DIAGNOSIS — I10 Essential (primary) hypertension: Secondary | ICD-10-CM | POA: Diagnosis not present

## 2020-10-29 DIAGNOSIS — Z20828 Contact with and (suspected) exposure to other viral communicable diseases: Secondary | ICD-10-CM | POA: Diagnosis not present

## 2020-10-29 DIAGNOSIS — J3089 Other allergic rhinitis: Secondary | ICD-10-CM | POA: Diagnosis not present

## 2020-10-29 DIAGNOSIS — Z79899 Other long term (current) drug therapy: Secondary | ICD-10-CM | POA: Diagnosis not present

## 2020-11-04 ENCOUNTER — Other Ambulatory Visit: Payer: Self-pay | Admitting: Family Medicine

## 2020-11-04 DIAGNOSIS — F4321 Adjustment disorder with depressed mood: Secondary | ICD-10-CM | POA: Diagnosis not present

## 2020-11-04 DIAGNOSIS — I10 Essential (primary) hypertension: Secondary | ICD-10-CM

## 2020-11-04 DIAGNOSIS — F411 Generalized anxiety disorder: Secondary | ICD-10-CM | POA: Diagnosis not present

## 2020-11-04 DIAGNOSIS — Z20828 Contact with and (suspected) exposure to other viral communicable diseases: Secondary | ICD-10-CM | POA: Diagnosis not present

## 2020-11-04 DIAGNOSIS — J3089 Other allergic rhinitis: Secondary | ICD-10-CM | POA: Diagnosis not present

## 2020-11-04 DIAGNOSIS — Z79899 Other long term (current) drug therapy: Secondary | ICD-10-CM | POA: Diagnosis not present

## 2020-11-12 DIAGNOSIS — Z79899 Other long term (current) drug therapy: Secondary | ICD-10-CM | POA: Diagnosis not present

## 2020-11-19 DIAGNOSIS — Z79899 Other long term (current) drug therapy: Secondary | ICD-10-CM | POA: Diagnosis not present

## 2020-11-20 DIAGNOSIS — I10 Essential (primary) hypertension: Secondary | ICD-10-CM | POA: Diagnosis not present

## 2020-11-20 DIAGNOSIS — F4321 Adjustment disorder with depressed mood: Secondary | ICD-10-CM | POA: Diagnosis not present

## 2020-11-20 DIAGNOSIS — F411 Generalized anxiety disorder: Secondary | ICD-10-CM | POA: Diagnosis not present

## 2020-11-20 DIAGNOSIS — Z79899 Other long term (current) drug therapy: Secondary | ICD-10-CM | POA: Diagnosis not present

## 2020-11-20 DIAGNOSIS — J3089 Other allergic rhinitis: Secondary | ICD-10-CM | POA: Diagnosis not present

## 2020-11-20 DIAGNOSIS — Z20828 Contact with and (suspected) exposure to other viral communicable diseases: Secondary | ICD-10-CM | POA: Diagnosis not present

## 2020-11-23 DIAGNOSIS — Z20828 Contact with and (suspected) exposure to other viral communicable diseases: Secondary | ICD-10-CM | POA: Diagnosis not present

## 2020-11-26 DIAGNOSIS — J3089 Other allergic rhinitis: Secondary | ICD-10-CM | POA: Diagnosis not present

## 2020-11-26 DIAGNOSIS — I10 Essential (primary) hypertension: Secondary | ICD-10-CM | POA: Diagnosis not present

## 2020-11-26 DIAGNOSIS — Z20828 Contact with and (suspected) exposure to other viral communicable diseases: Secondary | ICD-10-CM | POA: Diagnosis not present

## 2020-11-26 DIAGNOSIS — Z79899 Other long term (current) drug therapy: Secondary | ICD-10-CM | POA: Diagnosis not present

## 2020-11-26 DIAGNOSIS — F411 Generalized anxiety disorder: Secondary | ICD-10-CM | POA: Diagnosis not present

## 2020-11-26 DIAGNOSIS — F4321 Adjustment disorder with depressed mood: Secondary | ICD-10-CM | POA: Diagnosis not present

## 2020-11-27 DIAGNOSIS — Z79899 Other long term (current) drug therapy: Secondary | ICD-10-CM | POA: Diagnosis not present

## 2020-12-03 DIAGNOSIS — F4321 Adjustment disorder with depressed mood: Secondary | ICD-10-CM | POA: Diagnosis not present

## 2020-12-03 DIAGNOSIS — J3089 Other allergic rhinitis: Secondary | ICD-10-CM | POA: Diagnosis not present

## 2020-12-03 DIAGNOSIS — F411 Generalized anxiety disorder: Secondary | ICD-10-CM | POA: Diagnosis not present

## 2020-12-03 DIAGNOSIS — Z79899 Other long term (current) drug therapy: Secondary | ICD-10-CM | POA: Diagnosis not present

## 2020-12-03 DIAGNOSIS — Z20828 Contact with and (suspected) exposure to other viral communicable diseases: Secondary | ICD-10-CM | POA: Diagnosis not present

## 2020-12-03 DIAGNOSIS — I10 Essential (primary) hypertension: Secondary | ICD-10-CM | POA: Diagnosis not present

## 2020-12-10 DIAGNOSIS — Z79899 Other long term (current) drug therapy: Secondary | ICD-10-CM | POA: Diagnosis not present

## 2020-12-10 DIAGNOSIS — F4321 Adjustment disorder with depressed mood: Secondary | ICD-10-CM | POA: Diagnosis not present

## 2020-12-10 DIAGNOSIS — Z20828 Contact with and (suspected) exposure to other viral communicable diseases: Secondary | ICD-10-CM | POA: Diagnosis not present

## 2020-12-10 DIAGNOSIS — F411 Generalized anxiety disorder: Secondary | ICD-10-CM | POA: Diagnosis not present

## 2020-12-10 DIAGNOSIS — J3089 Other allergic rhinitis: Secondary | ICD-10-CM | POA: Diagnosis not present

## 2020-12-10 DIAGNOSIS — I10 Essential (primary) hypertension: Secondary | ICD-10-CM | POA: Diagnosis not present

## 2020-12-15 DIAGNOSIS — U071 COVID-19: Secondary | ICD-10-CM | POA: Diagnosis not present

## 2020-12-15 DIAGNOSIS — J9601 Acute respiratory failure with hypoxia: Secondary | ICD-10-CM | POA: Diagnosis not present

## 2020-12-15 DIAGNOSIS — J1282 Pneumonia due to coronavirus disease 2019: Secondary | ICD-10-CM | POA: Diagnosis not present

## 2020-12-17 DIAGNOSIS — Z20828 Contact with and (suspected) exposure to other viral communicable diseases: Secondary | ICD-10-CM | POA: Diagnosis not present

## 2020-12-17 DIAGNOSIS — Z79899 Other long term (current) drug therapy: Secondary | ICD-10-CM | POA: Diagnosis not present

## 2020-12-17 DIAGNOSIS — F4321 Adjustment disorder with depressed mood: Secondary | ICD-10-CM | POA: Diagnosis not present

## 2020-12-17 DIAGNOSIS — F411 Generalized anxiety disorder: Secondary | ICD-10-CM | POA: Diagnosis not present

## 2020-12-17 DIAGNOSIS — I10 Essential (primary) hypertension: Secondary | ICD-10-CM | POA: Diagnosis not present

## 2020-12-17 DIAGNOSIS — J3089 Other allergic rhinitis: Secondary | ICD-10-CM | POA: Diagnosis not present

## 2020-12-23 ENCOUNTER — Other Ambulatory Visit: Payer: Self-pay | Admitting: Family Medicine

## 2020-12-23 NOTE — Telephone Encounter (Signed)
Patient called and advised it's due for an appointment, she verbalized understanding. Appointment scheduled for Monday, 01/11/21 at 1320 with Dr. Sherrie Mustache.

## 2020-12-24 DIAGNOSIS — F4321 Adjustment disorder with depressed mood: Secondary | ICD-10-CM | POA: Diagnosis not present

## 2020-12-24 DIAGNOSIS — F411 Generalized anxiety disorder: Secondary | ICD-10-CM | POA: Diagnosis not present

## 2020-12-24 DIAGNOSIS — J3089 Other allergic rhinitis: Secondary | ICD-10-CM | POA: Diagnosis not present

## 2020-12-24 DIAGNOSIS — Z20828 Contact with and (suspected) exposure to other viral communicable diseases: Secondary | ICD-10-CM | POA: Diagnosis not present

## 2020-12-24 DIAGNOSIS — Z79899 Other long term (current) drug therapy: Secondary | ICD-10-CM | POA: Diagnosis not present

## 2020-12-24 DIAGNOSIS — I10 Essential (primary) hypertension: Secondary | ICD-10-CM | POA: Diagnosis not present

## 2020-12-31 DIAGNOSIS — Z20828 Contact with and (suspected) exposure to other viral communicable diseases: Secondary | ICD-10-CM | POA: Diagnosis not present

## 2020-12-31 DIAGNOSIS — F4321 Adjustment disorder with depressed mood: Secondary | ICD-10-CM | POA: Diagnosis not present

## 2020-12-31 DIAGNOSIS — Z79899 Other long term (current) drug therapy: Secondary | ICD-10-CM | POA: Diagnosis not present

## 2020-12-31 DIAGNOSIS — F411 Generalized anxiety disorder: Secondary | ICD-10-CM | POA: Diagnosis not present

## 2020-12-31 DIAGNOSIS — J3089 Other allergic rhinitis: Secondary | ICD-10-CM | POA: Diagnosis not present

## 2020-12-31 DIAGNOSIS — I10 Essential (primary) hypertension: Secondary | ICD-10-CM | POA: Diagnosis not present

## 2021-01-07 DIAGNOSIS — J3089 Other allergic rhinitis: Secondary | ICD-10-CM | POA: Diagnosis not present

## 2021-01-07 DIAGNOSIS — F411 Generalized anxiety disorder: Secondary | ICD-10-CM | POA: Diagnosis not present

## 2021-01-07 DIAGNOSIS — Z79899 Other long term (current) drug therapy: Secondary | ICD-10-CM | POA: Diagnosis not present

## 2021-01-07 DIAGNOSIS — I10 Essential (primary) hypertension: Secondary | ICD-10-CM | POA: Diagnosis not present

## 2021-01-07 DIAGNOSIS — F4321 Adjustment disorder with depressed mood: Secondary | ICD-10-CM | POA: Diagnosis not present

## 2021-01-07 DIAGNOSIS — Z20828 Contact with and (suspected) exposure to other viral communicable diseases: Secondary | ICD-10-CM | POA: Diagnosis not present

## 2021-01-11 ENCOUNTER — Ambulatory Visit: Payer: Self-pay | Admitting: Family Medicine

## 2021-01-11 NOTE — Progress Notes (Deleted)
Established patient visit   Patient: Diane Keller   DOB: [birth year]   45 y.o. Female  MRN: [id number] Visit Date: 01/11/2021  Today's healthcare provider: Mila Merry, MD   No chief complaint on file.  Subjective    HPI  Hypertension, follow-up  BP Readings from Last 3 Encounters:  05/01/20 108/82  04/21/20 127/70  04/16/20 (!) 146/83   Wt Readings from Last 3 Encounters:  05/01/20 230 lb (104.3 kg)  04/16/20 263 lb 0.1 oz (119.3 kg)  03/29/19 256 lb 6.4 oz (116.3 kg)     She was last seen for hypertension 8 months ago.  BP at that visit was 108/82. Management since that visit includes reduced Lisinopril to 10mg  daily.  She reports {excellent/good/fair/poor:19665} compliance with treatment. She {is/is not:9024} having side effects. {document side effects if present:1} She is following a {diet:21022986} diet. She {is/is not:9024} exercising. She {does/does not:200015} smoke.  Use of agents associated with hypertension: none.   Outside blood pressures are {***enter patient reported home BP readings, or 'not being checked':1}. Symptoms: {Yes/No:20286} chest pain {Yes/No:20286} chest pressure  {Yes/No:20286} palpitations {Yes/No:20286} syncope  {Yes/No:20286} dyspnea {Yes/No:20286} orthopnea  {Yes/No:20286} paroxysmal nocturnal dyspnea {Yes/No:20286} lower extremity edema   Pertinent labs: Lab Results  Component Value Date   CHOL 150 05/11/2017   HDL 41 05/11/2017   LDLCALC 94 05/11/2017   TRIG 77 05/11/2017   CHOLHDL 3.7 05/11/2017   Lab Results  Component Value Date   NA 139 05/01/2020   K 4.4 05/01/2020   CREATININE 0.94 05/01/2020   GFRNONAA 73 05/01/2020   GFRAA 84 05/01/2020   GLUCOSE 94 05/01/2020     The ASCVD Risk score (Goff DC Jr., et al., 2013) failed to calculate for the following reasons:   Cannot find a previous HDL lab   Cannot find a previous total cholesterol lab    ---------------------------------------------------------------------------------------------------  Follow up for hypomagnesemia:  The patient was last seen for this 8 months ago. Changes made at last visit include advising patient to take OTC magnesium oxide 250mg  twice a day.  She reports {excellent/good/fair/poor:19665} compliance with treatment. She feels that condition is {improved/worse/unchanged:3041574}. She {is/is not:21021397} having side effects. ***  -----------------------------------------------------------------------------------------  Follow up for Iron deficiency:  The patient was last seen for this 8 months ago. Changes made at last visit include advising patient to start OTC iron sulfate 325mg  once a day.  If she was not able to take oral iron then would referral to hematology for iron infusion.  She reports {excellent/good/fair/poor:19665} compliance with treatment. She feels that condition is {improved/worse/unchanged:3041574}. She {is/is not:21021397} having side effects. ***  -----------------------------------------------------------------------------------------    {Show patient history (optional):23778::" "}   Medications: Outpatient Medications Prior to Visit  Medication Sig  . amLODipine (NORVASC) 10 MG tablet Take 1 tablet (10 mg total) by mouth daily. OFFICE VISIT NEEDED FOR ADDITIONAL REFILLS  . albuterol (VENTOLIN HFA) 108 (90 Base) MCG/ACT inhaler Inhale 1 puff into the lungs every 4 (four) hours.  . Buprenorphine HCl-Naloxone HCl (SUBOXONE) 8-2 MG FILM Place 2 Film under the tongue daily.  . ferrous sulfate 325 (65 FE) MG EC tablet Take 1 tablet (325 mg total) by mouth daily with breakfast.  . fluticasone (FLOVENT HFA) 220 MCG/ACT inhaler Inhale 2 puffs into the lungs 2 (two) times daily.  2014 ipratropium (ATROVENT HFA) 17 MCG/ACT inhaler Inhale 2 puffs into the lungs every 4 (four) hours.  labetalol (NORMODYNE) 300 MG tablet TAKE 1 TABLET  (300  MG TOTAL) BY MOUTH 2 (TWO) TIMES DAILY.  Marland Kitchen lisinopril (ZESTRIL) 20 MG tablet Take 0.5 tablets (10 mg total) by mouth daily.  Marland Kitchen omeprazole (PRILOSEC) 20 MG capsule TAKE 1 CAPSULE BY MOUTH EVERY DAY   No facility-administered medications prior to visit.    Review of Systems  {Labs  Heme  Chem  Endocrine  Serology  Results Review (optional):23779::" "}   Objective    There were no vitals taken for this visit. {Show previous vital signs (optional):23777::" "}   Physical Exam  ***  No results found for any visits on 01/11/21.  Assessment & Plan     ***  No follow-ups on file.      {provider attestation***:1}   Mila Merry, MD  [facility] [phone] (phone) [phone] (fax)  [facility]

## 2021-01-12 DIAGNOSIS — J9601 Acute respiratory failure with hypoxia: Secondary | ICD-10-CM | POA: Diagnosis not present

## 2021-01-12 DIAGNOSIS — U071 COVID-19: Secondary | ICD-10-CM | POA: Diagnosis not present

## 2021-01-12 DIAGNOSIS — J1282 Pneumonia due to coronavirus disease 2019: Secondary | ICD-10-CM | POA: Diagnosis not present

## 2021-01-14 DIAGNOSIS — I10 Essential (primary) hypertension: Secondary | ICD-10-CM | POA: Diagnosis not present

## 2021-01-14 DIAGNOSIS — Z79899 Other long term (current) drug therapy: Secondary | ICD-10-CM | POA: Diagnosis not present

## 2021-01-14 DIAGNOSIS — J3089 Other allergic rhinitis: Secondary | ICD-10-CM | POA: Diagnosis not present

## 2021-01-14 DIAGNOSIS — F4321 Adjustment disorder with depressed mood: Secondary | ICD-10-CM | POA: Diagnosis not present

## 2021-01-14 DIAGNOSIS — Z20828 Contact with and (suspected) exposure to other viral communicable diseases: Secondary | ICD-10-CM | POA: Diagnosis not present

## 2021-01-14 DIAGNOSIS — F411 Generalized anxiety disorder: Secondary | ICD-10-CM | POA: Diagnosis not present

## 2021-01-21 DIAGNOSIS — Z20828 Contact with and (suspected) exposure to other viral communicable diseases: Secondary | ICD-10-CM | POA: Diagnosis not present

## 2021-01-21 DIAGNOSIS — F4321 Adjustment disorder with depressed mood: Secondary | ICD-10-CM | POA: Diagnosis not present

## 2021-01-21 DIAGNOSIS — F411 Generalized anxiety disorder: Secondary | ICD-10-CM | POA: Diagnosis not present

## 2021-01-21 DIAGNOSIS — I10 Essential (primary) hypertension: Secondary | ICD-10-CM | POA: Diagnosis not present

## 2021-01-21 DIAGNOSIS — J3089 Other allergic rhinitis: Secondary | ICD-10-CM | POA: Diagnosis not present

## 2021-01-21 DIAGNOSIS — Z79899 Other long term (current) drug therapy: Secondary | ICD-10-CM | POA: Diagnosis not present

## 2021-01-22 ENCOUNTER — Other Ambulatory Visit: Payer: Self-pay | Admitting: Family Medicine

## 2021-01-22 DIAGNOSIS — I10 Essential (primary) hypertension: Secondary | ICD-10-CM

## 2021-01-22 NOTE — Telephone Encounter (Signed)
Requested medications are due for refill today yes  Requested medications are on the active medication list yes  Last refill 2/9  Last visit 04/2020  Future visit scheduled no  Notes to clinic Failed protocol due to no valid visit within 6  months, no upcoming appt scheduled.

## 2021-01-25 NOTE — Telephone Encounter (Signed)
Tried calling patient. Left message to call back. Ok for [facility] to advise and schedule follow up appointment when patient returns call.

## 2021-01-27 NOTE — Telephone Encounter (Signed)
Tried calling patient. Left message to call back. OK for [facility] to advise and schedule appointment.  

## 2021-01-28 DIAGNOSIS — Z79899 Other long term (current) drug therapy: Secondary | ICD-10-CM | POA: Diagnosis not present

## 2021-01-28 DIAGNOSIS — I10 Essential (primary) hypertension: Secondary | ICD-10-CM | POA: Diagnosis not present

## 2021-01-28 DIAGNOSIS — F4321 Adjustment disorder with depressed mood: Secondary | ICD-10-CM | POA: Diagnosis not present

## 2021-01-28 DIAGNOSIS — J3089 Other allergic rhinitis: Secondary | ICD-10-CM | POA: Diagnosis not present

## 2021-01-28 DIAGNOSIS — Z20828 Contact with and (suspected) exposure to other viral communicable diseases: Secondary | ICD-10-CM | POA: Diagnosis not present

## 2021-01-28 DIAGNOSIS — F411 Generalized anxiety disorder: Secondary | ICD-10-CM | POA: Diagnosis not present

## 2021-01-29 ENCOUNTER — Other Ambulatory Visit: Payer: Self-pay | Admitting: Family Medicine

## 2021-01-29 DIAGNOSIS — R12 Heartburn: Secondary | ICD-10-CM

## 2021-02-04 DIAGNOSIS — F411 Generalized anxiety disorder: Secondary | ICD-10-CM | POA: Diagnosis not present

## 2021-02-04 DIAGNOSIS — I10 Essential (primary) hypertension: Secondary | ICD-10-CM | POA: Diagnosis not present

## 2021-02-04 DIAGNOSIS — Z79899 Other long term (current) drug therapy: Secondary | ICD-10-CM | POA: Diagnosis not present

## 2021-02-04 DIAGNOSIS — Z20828 Contact with and (suspected) exposure to other viral communicable diseases: Secondary | ICD-10-CM | POA: Diagnosis not present

## 2021-02-04 DIAGNOSIS — F4321 Adjustment disorder with depressed mood: Secondary | ICD-10-CM | POA: Diagnosis not present

## 2021-02-04 DIAGNOSIS — J3089 Other allergic rhinitis: Secondary | ICD-10-CM | POA: Diagnosis not present

## 2021-02-11 DIAGNOSIS — F4321 Adjustment disorder with depressed mood: Secondary | ICD-10-CM | POA: Diagnosis not present

## 2021-02-11 DIAGNOSIS — Z79899 Other long term (current) drug therapy: Secondary | ICD-10-CM | POA: Diagnosis not present

## 2021-02-11 DIAGNOSIS — Z20828 Contact with and (suspected) exposure to other viral communicable diseases: Secondary | ICD-10-CM | POA: Diagnosis not present

## 2021-02-11 DIAGNOSIS — J3089 Other allergic rhinitis: Secondary | ICD-10-CM | POA: Diagnosis not present

## 2021-02-11 DIAGNOSIS — F411 Generalized anxiety disorder: Secondary | ICD-10-CM | POA: Diagnosis not present

## 2021-02-11 DIAGNOSIS — I10 Essential (primary) hypertension: Secondary | ICD-10-CM | POA: Diagnosis not present

## 2021-02-12 DIAGNOSIS — U071 COVID-19: Secondary | ICD-10-CM | POA: Diagnosis not present

## 2021-02-12 DIAGNOSIS — J1282 Pneumonia due to coronavirus disease 2019: Secondary | ICD-10-CM | POA: Diagnosis not present

## 2021-02-12 DIAGNOSIS — J9601 Acute respiratory failure with hypoxia: Secondary | ICD-10-CM | POA: Diagnosis not present

## 2021-02-15 DIAGNOSIS — Z20828 Contact with and (suspected) exposure to other viral communicable diseases: Secondary | ICD-10-CM | POA: Diagnosis not present

## 2021-02-15 DIAGNOSIS — F411 Generalized anxiety disorder: Secondary | ICD-10-CM | POA: Diagnosis not present

## 2021-02-15 DIAGNOSIS — F4321 Adjustment disorder with depressed mood: Secondary | ICD-10-CM | POA: Diagnosis not present

## 2021-02-15 DIAGNOSIS — I10 Essential (primary) hypertension: Secondary | ICD-10-CM | POA: Diagnosis not present

## 2021-02-15 DIAGNOSIS — Z79899 Other long term (current) drug therapy: Secondary | ICD-10-CM | POA: Diagnosis not present

## 2021-02-15 DIAGNOSIS — J3089 Other allergic rhinitis: Secondary | ICD-10-CM | POA: Diagnosis not present

## 2021-02-22 ENCOUNTER — Other Ambulatory Visit: Payer: Self-pay | Admitting: Family Medicine

## 2021-02-25 DIAGNOSIS — Z79899 Other long term (current) drug therapy: Secondary | ICD-10-CM | POA: Diagnosis not present

## 2021-02-25 DIAGNOSIS — F411 Generalized anxiety disorder: Secondary | ICD-10-CM | POA: Diagnosis not present

## 2021-02-25 DIAGNOSIS — J3089 Other allergic rhinitis: Secondary | ICD-10-CM | POA: Diagnosis not present

## 2021-02-25 DIAGNOSIS — I10 Essential (primary) hypertension: Secondary | ICD-10-CM | POA: Diagnosis not present

## 2021-02-25 DIAGNOSIS — Z20828 Contact with and (suspected) exposure to other viral communicable diseases: Secondary | ICD-10-CM | POA: Diagnosis not present

## 2021-02-25 DIAGNOSIS — F4321 Adjustment disorder with depressed mood: Secondary | ICD-10-CM | POA: Diagnosis not present

## 2021-03-04 DIAGNOSIS — Z79899 Other long term (current) drug therapy: Secondary | ICD-10-CM | POA: Diagnosis not present

## 2021-03-04 DIAGNOSIS — F411 Generalized anxiety disorder: Secondary | ICD-10-CM | POA: Diagnosis not present

## 2021-03-04 DIAGNOSIS — F4321 Adjustment disorder with depressed mood: Secondary | ICD-10-CM | POA: Diagnosis not present

## 2021-03-04 DIAGNOSIS — I10 Essential (primary) hypertension: Secondary | ICD-10-CM | POA: Diagnosis not present

## 2021-03-04 DIAGNOSIS — J3089 Other allergic rhinitis: Secondary | ICD-10-CM | POA: Diagnosis not present

## 2021-03-04 DIAGNOSIS — Z20828 Contact with and (suspected) exposure to other viral communicable diseases: Secondary | ICD-10-CM | POA: Diagnosis not present

## 2021-03-10 DIAGNOSIS — Z20828 Contact with and (suspected) exposure to other viral communicable diseases: Secondary | ICD-10-CM | POA: Diagnosis not present

## 2021-03-10 DIAGNOSIS — I10 Essential (primary) hypertension: Secondary | ICD-10-CM | POA: Diagnosis not present

## 2021-03-10 DIAGNOSIS — F411 Generalized anxiety disorder: Secondary | ICD-10-CM | POA: Diagnosis not present

## 2021-03-10 DIAGNOSIS — J3089 Other allergic rhinitis: Secondary | ICD-10-CM | POA: Diagnosis not present

## 2021-03-10 DIAGNOSIS — Z79899 Other long term (current) drug therapy: Secondary | ICD-10-CM | POA: Diagnosis not present

## 2021-03-10 DIAGNOSIS — F4321 Adjustment disorder with depressed mood: Secondary | ICD-10-CM | POA: Diagnosis not present

## 2021-03-12 ENCOUNTER — Other Ambulatory Visit: Payer: Self-pay | Admitting: *Deleted

## 2021-03-12 ENCOUNTER — Other Ambulatory Visit: Payer: Self-pay | Admitting: Family Medicine

## 2021-03-12 DIAGNOSIS — I10 Essential (primary) hypertension: Secondary | ICD-10-CM

## 2021-03-12 MED ORDER — LABETALOL HCL 300 MG PO TABS: 300.0000 mg | ORAL_TABLET | Freq: Two times a day (BID) | ORAL | 0 refills | Status: DC

## 2021-03-12 NOTE — Progress Notes (Signed)
Call to patient- medication appointment scheduled- patient request RF of both BP medications- call note made for RF. Patient advised can only RF through appointment date.

## 2021-03-12 NOTE — Telephone Encounter (Signed)
See previous note- Call to patient- scheduled for medication follow up- only can RF through appointment date #36

## 2021-03-12 NOTE — Telephone Encounter (Signed)
Call to patient- scheduled appointment for medication follow up- #18 courtesy given.

## 2021-03-14 DIAGNOSIS — U071 COVID-19: Secondary | ICD-10-CM | POA: Diagnosis not present

## 2021-03-14 DIAGNOSIS — J9601 Acute respiratory failure with hypoxia: Secondary | ICD-10-CM | POA: Diagnosis not present

## 2021-03-14 DIAGNOSIS — J1282 Pneumonia due to coronavirus disease 2019: Secondary | ICD-10-CM | POA: Diagnosis not present

## 2021-03-18 DIAGNOSIS — Z20828 Contact with and (suspected) exposure to other viral communicable diseases: Secondary | ICD-10-CM | POA: Diagnosis not present

## 2021-03-18 DIAGNOSIS — F411 Generalized anxiety disorder: Secondary | ICD-10-CM | POA: Diagnosis not present

## 2021-03-18 DIAGNOSIS — F4321 Adjustment disorder with depressed mood: Secondary | ICD-10-CM | POA: Diagnosis not present

## 2021-03-18 DIAGNOSIS — J3089 Other allergic rhinitis: Secondary | ICD-10-CM | POA: Diagnosis not present

## 2021-03-18 DIAGNOSIS — Z79899 Other long term (current) drug therapy: Secondary | ICD-10-CM | POA: Diagnosis not present

## 2021-03-18 DIAGNOSIS — I10 Essential (primary) hypertension: Secondary | ICD-10-CM | POA: Diagnosis not present

## 2021-03-26 DIAGNOSIS — Z20828 Contact with and (suspected) exposure to other viral communicable diseases: Secondary | ICD-10-CM | POA: Diagnosis not present

## 2021-03-26 DIAGNOSIS — F411 Generalized anxiety disorder: Secondary | ICD-10-CM | POA: Diagnosis not present

## 2021-03-26 DIAGNOSIS — J3089 Other allergic rhinitis: Secondary | ICD-10-CM | POA: Diagnosis not present

## 2021-03-26 DIAGNOSIS — I10 Essential (primary) hypertension: Secondary | ICD-10-CM | POA: Diagnosis not present

## 2021-03-26 DIAGNOSIS — Z79899 Other long term (current) drug therapy: Secondary | ICD-10-CM | POA: Diagnosis not present

## 2021-03-26 DIAGNOSIS — F4321 Adjustment disorder with depressed mood: Secondary | ICD-10-CM | POA: Diagnosis not present

## 2021-03-30 ENCOUNTER — Ambulatory Visit: Payer: Self-pay | Admitting: Family Medicine

## 2021-03-30 NOTE — Progress Notes (Deleted)
Established patient visit   Patient: Diane Keller   DOB: [birth year]   45 y.o. Female  MRN: [id number] Visit Date: 03/30/2021  Today's healthcare provider: Mila Merry, MD   No chief complaint on file.  Subjective    HPI  Hypertension, follow-up  BP Readings from Last 3 Encounters:  05/01/20 108/82  04/21/20 127/70  04/16/20 (!) 146/83   Wt Readings from Last 3 Encounters:  05/01/20 230 lb (104.3 kg)  04/16/20 263 lb 0.1 oz (119.3 kg)  03/29/19 256 lb 6.4 oz (116.3 kg)     She was last seen for hypertension 05/01/2020.  BP at that visit was 108/82. Management since that visit includes decreased Lisinopril to 10mg  daily.  She reports {excellent/good/fair/poor:19665} compliance with treatment. She {is/is not:9024} having side effects. {document side effects if present:1} She is following a {diet:21022986} diet. She {is/is not:9024} exercising. She {does/does not:200015} smoke.  Use of agents associated with hypertension: none.   Outside blood pressures are {***enter patient reported home BP readings, or 'not being checked':1}. Symptoms: {Yes/No:20286} chest pain {Yes/No:20286} chest pressure  {Yes/No:20286} palpitations {Yes/No:20286} syncope  {Yes/No:20286} dyspnea {Yes/No:20286} orthopnea  {Yes/No:20286} paroxysmal nocturnal dyspnea {Yes/No:20286} lower extremity edema   Pertinent labs: Lab Results  Component Value Date   CHOL 150 05/11/2017   HDL 41 05/11/2017   LDLCALC 94 05/11/2017   TRIG 77 05/11/2017   CHOLHDL 3.7 05/11/2017   Lab Results  Component Value Date   NA 139 05/01/2020   K 4.4 05/01/2020   CREATININE 0.94 05/01/2020   GFRNONAA 73 05/01/2020   GFRAA 84 05/01/2020   GLUCOSE 94 05/01/2020     The ASCVD Risk score (Goff DC Jr., et al., 2013) failed to calculate for the following reasons:   Cannot find a previous HDL lab   Cannot find a previous total cholesterol lab    ---------------------------------------------------------------------------------------------------  Follow up for hypomagnesemia:  The patient was last seen for this 11 months ago. Changes made at last visit include advising patient to take OTC magnesium oxide 250mg  twice a day due to low magnesium levels.  She reports {excellent/good/fair/poor:19665} compliance with treatment. She feels that condition is {improved/worse/unchanged:3041574}. She {is/is not:21021397} having side effects. ***  -----------------------------------------------------------------------------------------  Follow up for iron deficiency anemia:  The patient was last seen for this 11 months ago. Changes made at last visit include advising patient to take OTC iron sulfate 325mg  once a day.  She reports {excellent/good/fair/poor:19665} compliance with treatment. She feels that condition is {improved/worse/unchanged:3041574}. She {is/is not:21021397} having side effects. ***  -----------------------------------------------------------------------------------------    {Show patient history (optional):23778::" "}   Medications: Outpatient Medications Prior to Visit  Medication Sig  . albuterol (VENTOLIN HFA) 108 (90 Base) MCG/ACT inhaler Inhale 1 puff into the lungs every 4 (four) hours.  2014 amLODipine (NORVASC) 10 MG tablet TAKE 1 TABLET (10 MG TOTAL) BY MOUTH DAILY. OFFICE VISIT NEEDED FOR ADDITIONAL REFILLS  . Buprenorphine HCl-Naloxone HCl (SUBOXONE) 8-2 MG FILM Place 2 Film under the tongue daily.  . ferrous sulfate 325 (65 FE) MG EC tablet Take 1 tablet (325 mg total) by mouth daily with breakfast.  . fluticasone (FLOVENT HFA) 220 MCG/ACT inhaler Inhale 2 puffs into the lungs 2 (two) times daily.  ipratropium (ATROVENT HFA) 17 MCG/ACT inhaler Inhale 2 puffs into the lungs every 4 (four) hours.  labetalol (NORMODYNE) 300 MG tablet Take 1 tablet (300 mg total) by mouth 2 (two) times daily.  Marland Kitchen  lisinopril (ZESTRIL) 20 MG  tablet Take 0.5 tablets (10 mg total) by mouth daily.  Marland Kitchen omeprazole (PRILOSEC) 20 MG capsule TAKE 1 CAPSULE BY MOUTH EVERY DAY   No facility-administered medications prior to visit.    Review of Systems  {Labs  Heme  Chem  Endocrine  Serology  Results Review (optional):23779::" "}   Objective    There were no vitals taken for this visit. {Show previous vital signs (optional):23777::" "}   Physical Exam  ***  No results found for any visits on 03/30/21.  Assessment & Plan     ***  No follow-ups on file.      {provider attestation***:1}   Mila Merry, MD  [facility] [phone] (phone) [phone] (fax)  [facility]

## 2021-04-01 DIAGNOSIS — I10 Essential (primary) hypertension: Secondary | ICD-10-CM | POA: Diagnosis not present

## 2021-04-01 DIAGNOSIS — Z20828 Contact with and (suspected) exposure to other viral communicable diseases: Secondary | ICD-10-CM | POA: Diagnosis not present

## 2021-04-01 DIAGNOSIS — F4321 Adjustment disorder with depressed mood: Secondary | ICD-10-CM | POA: Diagnosis not present

## 2021-04-01 DIAGNOSIS — Z79899 Other long term (current) drug therapy: Secondary | ICD-10-CM | POA: Diagnosis not present

## 2021-04-01 DIAGNOSIS — J3089 Other allergic rhinitis: Secondary | ICD-10-CM | POA: Diagnosis not present

## 2021-04-01 DIAGNOSIS — F411 Generalized anxiety disorder: Secondary | ICD-10-CM | POA: Diagnosis not present

## 2021-04-02 DIAGNOSIS — Z79899 Other long term (current) drug therapy: Secondary | ICD-10-CM | POA: Diagnosis not present

## 2021-04-02 DIAGNOSIS — I10 Essential (primary) hypertension: Secondary | ICD-10-CM | POA: Diagnosis not present

## 2021-04-02 DIAGNOSIS — F4321 Adjustment disorder with depressed mood: Secondary | ICD-10-CM | POA: Diagnosis not present

## 2021-04-02 DIAGNOSIS — Z20828 Contact with and (suspected) exposure to other viral communicable diseases: Secondary | ICD-10-CM | POA: Diagnosis not present

## 2021-04-02 DIAGNOSIS — F411 Generalized anxiety disorder: Secondary | ICD-10-CM | POA: Diagnosis not present

## 2021-04-02 DIAGNOSIS — J3089 Other allergic rhinitis: Secondary | ICD-10-CM | POA: Diagnosis not present

## 2021-04-06 ENCOUNTER — Other Ambulatory Visit: Payer: Self-pay | Admitting: Family Medicine

## 2021-04-06 DIAGNOSIS — I10 Essential (primary) hypertension: Secondary | ICD-10-CM

## 2021-04-06 NOTE — Telephone Encounter (Signed)
Requested medication (s) are due for refill today - yes  Requested medication (s) are on the active medication list -yes  Future visit scheduled -no  Last refill: 03/12/21  Notes to clinic: Patient no showed last scheduled appointment- courtesy RF already given- sent for PCP review  Requested Prescriptions  Pending Prescriptions Disp Refills   labetalol (NORMODYNE) 300 MG tablet [Pharmacy Med Name: LABETALOL HCL 300 MG TABLET] 36 tablet 0    Sig: TAKE 1 TABLET BY MOUTH 2 TIMES DAILY.      Cardiovascular:  Beta Blockers Failed - 04/06/2021  2:10 PM      Failed - Valid encounter within last 6 months    Recent Outpatient Visits           11 months ago Pneumonia due to COVID-19 virus   [facility] Malva Limes, MD   11 months ago Pneumonia due to COVID-19 virus   [facility] Sherrie Mustache, Demetrios Isaacs, MD   2 years ago Essential hypertension   [facility] Malva Limes, MD   3 years ago Essential hypertension   [facility] [place], [place]                Passed - Last BP in normal range    BP Readings from Last 1 Encounters:  05/01/20 108/82          Passed - Last Heart Rate in normal range    Pulse Readings from Last 1 Encounters:  05/01/20 87              Requested Prescriptions  Pending Prescriptions Disp Refills   labetalol (NORMODYNE) 300 MG tablet [Pharmacy Med Name: LABETALOL HCL 300 MG TABLET] 36 tablet 0    Sig: TAKE 1 TABLET BY MOUTH 2 TIMES DAILY.      Cardiovascular:  Beta Blockers Failed - 04/06/2021  2:10 PM      Failed - Valid encounter within last 6 months    Recent Outpatient Visits           11 months ago Pneumonia due to COVID-19 virus   [facility] Malva Limes, MD   11 months ago Pneumonia due to COVID-19 virus   [facility] Sherrie Mustache, Demetrios Isaacs, MD   2 years ago Essential hypertension   [facility] Malva Limes, MD   3 years  ago Essential hypertension   [facility] [place], [place]                Passed - Last BP in normal range    BP Readings from Last 1 Encounters:  05/01/20 108/82          Passed - Last Heart Rate in normal range    Pulse Readings from Last 1 Encounters:  05/01/20 87

## 2021-04-09 DIAGNOSIS — J3089 Other allergic rhinitis: Secondary | ICD-10-CM | POA: Diagnosis not present

## 2021-04-09 DIAGNOSIS — F4321 Adjustment disorder with depressed mood: Secondary | ICD-10-CM | POA: Diagnosis not present

## 2021-04-09 DIAGNOSIS — F411 Generalized anxiety disorder: Secondary | ICD-10-CM | POA: Diagnosis not present

## 2021-04-09 DIAGNOSIS — Z20828 Contact with and (suspected) exposure to other viral communicable diseases: Secondary | ICD-10-CM | POA: Diagnosis not present

## 2021-04-09 DIAGNOSIS — Z79899 Other long term (current) drug therapy: Secondary | ICD-10-CM | POA: Diagnosis not present

## 2021-04-09 DIAGNOSIS — I10 Essential (primary) hypertension: Secondary | ICD-10-CM | POA: Diagnosis not present

## 2021-04-10 ENCOUNTER — Other Ambulatory Visit: Payer: Self-pay | Admitting: Family Medicine

## 2021-04-10 NOTE — Telephone Encounter (Signed)
Requested medication (s) are due for refill today: yes  Requested medication (s) are on the active medication list: yes  Last refill:  03/12/21   Future visit scheduled: no  Notes to clinic:  called pt and attempted to make appointment but she said she had to go and would call back and hung up.   Requested Prescriptions  Pending Prescriptions Disp Refills   amLODipine (NORVASC) 10 MG tablet [Pharmacy Med Name: AMLODIPINE BESYLATE 10 MG TAB] 18 tablet 0    Sig: TAKE 1 TABLET (10 MG TOTAL) BY MOUTH DAILY. OFFICE VISIT NEEDED FOR ADDITIONAL REFILLS      Cardiovascular:  Calcium Channel Blockers Failed - 04/10/2021  1:46 PM      Failed - Valid encounter within last 6 months    Recent Outpatient Visits           11 months ago Pneumonia due to COVID-19 virus   [facility] Malva Limes, MD   11 months ago Pneumonia due to COVID-19 virus   [facility] Malva Limes, MD   2 years ago Essential hypertension   [facility] Malva Limes, MD   3 years ago Essential hypertension   [facility] [place], [place]                Passed - Last BP in normal range    BP Readings from Last 1 Encounters:  05/01/20 108/82

## 2021-04-29 DIAGNOSIS — F4321 Adjustment disorder with depressed mood: Secondary | ICD-10-CM | POA: Diagnosis not present

## 2021-04-29 DIAGNOSIS — I10 Essential (primary) hypertension: Secondary | ICD-10-CM | POA: Diagnosis not present

## 2021-04-29 DIAGNOSIS — J3089 Other allergic rhinitis: Secondary | ICD-10-CM | POA: Diagnosis not present

## 2021-04-29 DIAGNOSIS — Z79899 Other long term (current) drug therapy: Secondary | ICD-10-CM | POA: Diagnosis not present

## 2021-04-29 DIAGNOSIS — F411 Generalized anxiety disorder: Secondary | ICD-10-CM | POA: Diagnosis not present

## 2021-04-29 DIAGNOSIS — Z20828 Contact with and (suspected) exposure to other viral communicable diseases: Secondary | ICD-10-CM | POA: Diagnosis not present

## 2021-04-30 ENCOUNTER — Other Ambulatory Visit: Payer: Self-pay | Admitting: Family Medicine

## 2021-04-30 DIAGNOSIS — I10 Essential (primary) hypertension: Secondary | ICD-10-CM

## 2021-04-30 NOTE — Telephone Encounter (Signed)
  Notes to clinic: Patient no showed appt on 03/30/2021 Review for refill    Requested Prescriptions  Pending Prescriptions Disp Refills   labetalol (NORMODYNE) 300 MG tablet [Pharmacy Med Name: LABETALOL HCL 300 MG TABLET] 60 tablet 0    Sig: TAKE 1 TABLET BY MOUTH 2 TIMES DAILY.      Cardiovascular:  Beta Blockers Failed - 04/30/2021 11:31 AM      Failed - Valid encounter within last 6 months    Recent Outpatient Visits           12 months ago Pneumonia due to COVID-19 virus   [facility] Sherrie Mustache, Demetrios Isaacs, MD   1 year ago Pneumonia due to COVID-19 virus   [facility] Malva Limes, MD   2 years ago Essential hypertension   [facility] Malva Limes, MD   3 years ago Essential hypertension   [facility] [place], [place]                Passed - Last BP in normal range    BP Readings from Last 1 Encounters:  05/01/20 108/82          Passed - Last Heart Rate in normal range    Pulse Readings from Last 1 Encounters:  05/01/20 87

## 2021-05-07 DIAGNOSIS — I10 Essential (primary) hypertension: Secondary | ICD-10-CM | POA: Diagnosis not present

## 2021-05-07 DIAGNOSIS — F4321 Adjustment disorder with depressed mood: Secondary | ICD-10-CM | POA: Diagnosis not present

## 2021-05-07 DIAGNOSIS — Z20828 Contact with and (suspected) exposure to other viral communicable diseases: Secondary | ICD-10-CM | POA: Diagnosis not present

## 2021-05-07 DIAGNOSIS — Z79899 Other long term (current) drug therapy: Secondary | ICD-10-CM | POA: Diagnosis not present

## 2021-05-07 DIAGNOSIS — J3089 Other allergic rhinitis: Secondary | ICD-10-CM | POA: Diagnosis not present

## 2021-05-07 DIAGNOSIS — F411 Generalized anxiety disorder: Secondary | ICD-10-CM | POA: Diagnosis not present

## 2021-05-14 DIAGNOSIS — J3089 Other allergic rhinitis: Secondary | ICD-10-CM | POA: Diagnosis not present

## 2021-05-14 DIAGNOSIS — F411 Generalized anxiety disorder: Secondary | ICD-10-CM | POA: Diagnosis not present

## 2021-05-14 DIAGNOSIS — Z79899 Other long term (current) drug therapy: Secondary | ICD-10-CM | POA: Diagnosis not present

## 2021-05-14 DIAGNOSIS — Z20828 Contact with and (suspected) exposure to other viral communicable diseases: Secondary | ICD-10-CM | POA: Diagnosis not present

## 2021-05-14 DIAGNOSIS — F4321 Adjustment disorder with depressed mood: Secondary | ICD-10-CM | POA: Diagnosis not present

## 2021-05-14 DIAGNOSIS — I10 Essential (primary) hypertension: Secondary | ICD-10-CM | POA: Diagnosis not present

## 2021-06-02 ENCOUNTER — Other Ambulatory Visit: Payer: Self-pay | Admitting: Family Medicine

## 2021-06-02 DIAGNOSIS — I10 Essential (primary) hypertension: Secondary | ICD-10-CM

## 2021-06-02 NOTE — Telephone Encounter (Signed)
Requested medications are due for refill today.  yes  Requested medications are on the active medications list.  yes  Last refill. 04/11/2021 & 04/07/2021  Future visit scheduled.   no  Notes to clinic.  Courtesy refill already given.

## 2021-06-04 DIAGNOSIS — I10 Essential (primary) hypertension: Secondary | ICD-10-CM | POA: Diagnosis not present

## 2021-06-04 DIAGNOSIS — J3089 Other allergic rhinitis: Secondary | ICD-10-CM | POA: Diagnosis not present

## 2021-06-04 DIAGNOSIS — Z79899 Other long term (current) drug therapy: Secondary | ICD-10-CM | POA: Diagnosis not present

## 2021-06-04 DIAGNOSIS — F411 Generalized anxiety disorder: Secondary | ICD-10-CM | POA: Diagnosis not present

## 2021-06-04 DIAGNOSIS — F4321 Adjustment disorder with depressed mood: Secondary | ICD-10-CM | POA: Diagnosis not present

## 2021-06-04 DIAGNOSIS — Z20828 Contact with and (suspected) exposure to other viral communicable diseases: Secondary | ICD-10-CM | POA: Diagnosis not present

## 2021-06-11 DIAGNOSIS — Z20828 Contact with and (suspected) exposure to other viral communicable diseases: Secondary | ICD-10-CM | POA: Diagnosis not present

## 2021-06-11 DIAGNOSIS — Z79899 Other long term (current) drug therapy: Secondary | ICD-10-CM | POA: Diagnosis not present

## 2021-06-11 DIAGNOSIS — F411 Generalized anxiety disorder: Secondary | ICD-10-CM | POA: Diagnosis not present

## 2021-06-11 DIAGNOSIS — F4321 Adjustment disorder with depressed mood: Secondary | ICD-10-CM | POA: Diagnosis not present

## 2021-06-11 DIAGNOSIS — J3089 Other allergic rhinitis: Secondary | ICD-10-CM | POA: Diagnosis not present

## 2021-06-11 DIAGNOSIS — I10 Essential (primary) hypertension: Secondary | ICD-10-CM | POA: Diagnosis not present

## 2021-06-18 DIAGNOSIS — F411 Generalized anxiety disorder: Secondary | ICD-10-CM | POA: Diagnosis not present

## 2021-06-18 DIAGNOSIS — J3089 Other allergic rhinitis: Secondary | ICD-10-CM | POA: Diagnosis not present

## 2021-06-18 DIAGNOSIS — Z20828 Contact with and (suspected) exposure to other viral communicable diseases: Secondary | ICD-10-CM | POA: Diagnosis not present

## 2021-06-18 DIAGNOSIS — I10 Essential (primary) hypertension: Secondary | ICD-10-CM | POA: Diagnosis not present

## 2021-06-18 DIAGNOSIS — F4321 Adjustment disorder with depressed mood: Secondary | ICD-10-CM | POA: Diagnosis not present

## 2021-06-18 DIAGNOSIS — Z79899 Other long term (current) drug therapy: Secondary | ICD-10-CM | POA: Diagnosis not present

## 2021-08-05 ENCOUNTER — Other Ambulatory Visit: Payer: Self-pay | Admitting: Family Medicine

## 2021-08-05 DIAGNOSIS — I10 Essential (primary) hypertension: Secondary | ICD-10-CM

## 2021-08-13 DIAGNOSIS — Z79899 Other long term (current) drug therapy: Secondary | ICD-10-CM | POA: Diagnosis not present

## 2021-08-13 DIAGNOSIS — I10 Essential (primary) hypertension: Secondary | ICD-10-CM | POA: Diagnosis not present

## 2021-08-13 DIAGNOSIS — Z20828 Contact with and (suspected) exposure to other viral communicable diseases: Secondary | ICD-10-CM | POA: Diagnosis not present

## 2021-08-13 DIAGNOSIS — J3089 Other allergic rhinitis: Secondary | ICD-10-CM | POA: Diagnosis not present

## 2021-08-13 DIAGNOSIS — F4321 Adjustment disorder with depressed mood: Secondary | ICD-10-CM | POA: Diagnosis not present

## 2021-08-13 DIAGNOSIS — F411 Generalized anxiety disorder: Secondary | ICD-10-CM | POA: Diagnosis not present

## 2021-09-03 DIAGNOSIS — Z20828 Contact with and (suspected) exposure to other viral communicable diseases: Secondary | ICD-10-CM | POA: Diagnosis not present

## 2021-09-03 DIAGNOSIS — Z79899 Other long term (current) drug therapy: Secondary | ICD-10-CM | POA: Diagnosis not present

## 2021-09-03 DIAGNOSIS — I10 Essential (primary) hypertension: Secondary | ICD-10-CM | POA: Diagnosis not present

## 2021-09-03 DIAGNOSIS — F411 Generalized anxiety disorder: Secondary | ICD-10-CM | POA: Diagnosis not present

## 2021-09-03 DIAGNOSIS — F4321 Adjustment disorder with depressed mood: Secondary | ICD-10-CM | POA: Diagnosis not present

## 2021-09-03 DIAGNOSIS — J3089 Other allergic rhinitis: Secondary | ICD-10-CM | POA: Diagnosis not present

## 2021-09-17 DIAGNOSIS — Z79899 Other long term (current) drug therapy: Secondary | ICD-10-CM | POA: Diagnosis not present

## 2021-09-17 DIAGNOSIS — J3089 Other allergic rhinitis: Secondary | ICD-10-CM | POA: Diagnosis not present

## 2021-09-17 DIAGNOSIS — F411 Generalized anxiety disorder: Secondary | ICD-10-CM | POA: Diagnosis not present

## 2021-09-17 DIAGNOSIS — F4321 Adjustment disorder with depressed mood: Secondary | ICD-10-CM | POA: Diagnosis not present

## 2021-09-17 DIAGNOSIS — I10 Essential (primary) hypertension: Secondary | ICD-10-CM | POA: Diagnosis not present

## 2021-09-17 DIAGNOSIS — Z20828 Contact with and (suspected) exposure to other viral communicable diseases: Secondary | ICD-10-CM | POA: Diagnosis not present

## 2021-10-01 DIAGNOSIS — J3089 Other allergic rhinitis: Secondary | ICD-10-CM | POA: Diagnosis not present

## 2021-10-01 DIAGNOSIS — Z20828 Contact with and (suspected) exposure to other viral communicable diseases: Secondary | ICD-10-CM | POA: Diagnosis not present

## 2021-10-01 DIAGNOSIS — F4321 Adjustment disorder with depressed mood: Secondary | ICD-10-CM | POA: Diagnosis not present

## 2021-10-01 DIAGNOSIS — I10 Essential (primary) hypertension: Secondary | ICD-10-CM | POA: Diagnosis not present

## 2021-10-01 DIAGNOSIS — F411 Generalized anxiety disorder: Secondary | ICD-10-CM | POA: Diagnosis not present

## 2021-10-01 DIAGNOSIS — Z79899 Other long term (current) drug therapy: Secondary | ICD-10-CM | POA: Diagnosis not present

## 2021-10-22 ENCOUNTER — Other Ambulatory Visit: Payer: Self-pay | Admitting: Family Medicine

## 2021-10-22 DIAGNOSIS — I10 Essential (primary) hypertension: Secondary | ICD-10-CM

## 2021-10-22 MED ORDER — LABETALOL HCL 300 MG PO TABS: 300.0000 mg | ORAL_TABLET | Freq: Two times a day (BID) | ORAL | 0 refills | Status: DC

## 2021-10-22 MED ORDER — AMLODIPINE BESYLATE 10 MG PO TABS: 10.0000 mg | ORAL_TABLET | Freq: Every day | ORAL | 0 refills | Status: DC

## 2021-10-22 NOTE — Telephone Encounter (Signed)
  Medication Refill - Medication: labetalol (NORMODYNE) 300 MG tablet and amLODipine (NORVASC) 10 MG tablet  Has the patient contacted their pharmacy? yes (Agent: If no, request that the patient contact the pharmacy for the refill. If patient does not wish to contact the pharmacy document the reason why and proceed with request.) (Agent: If yes, when and what did the pharmacy advise?)contact pcp  Preferred Pharmacy (with phone number or street name): [address] [place], [place] [address]  [phone] Has the patient been seen for an appointment in the last year OR does the patient have an upcoming appointment? yes  Agent: Please be advised that RX refills may take up to 3 business days. We ask that you follow-up with your pharmacy.

## 2021-10-22 NOTE — Telephone Encounter (Signed)
Patient is completely out of meds. Please send short supply.

## 2021-10-22 NOTE — Telephone Encounter (Signed)
Courtesy refill until appointment 11/01/21.  Requested Prescriptions  Pending Prescriptions Disp Refills  . amLODipine (NORVASC) 10 MG tablet 10 tablet 0    Sig: Take 1 tablet (10 mg total) by mouth daily. OFFICE VISIT NEEDED FOR ADDITIONAL REFILLS     Cardiovascular:  Calcium Channel Blockers Failed - 10/22/2021 12:19 PM      Failed - Valid encounter within last 6 months    Recent Outpatient Visits          1 year ago Pneumonia due to COVID-19 virus   [facility] Malva Limes, MD   1 year ago Pneumonia due to COVID-19 virus   [facility] Malva Limes, MD   2 years ago Essential hypertension   [facility] Malva Limes, MD   4 years ago Essential hypertension   [facility] [place], [place]      Future Appointments            In 1 week Sherrie Mustache, Demetrios Isaacs, MD [facility]           Passed - Last BP in normal range    BP Readings from Last 1 Encounters:  05/01/20 108/82         . labetalol (NORMODYNE) 300 MG tablet 20 tablet 0    Sig: Take 1 tablet (300 mg total) by mouth 2 (two) times daily.     Cardiovascular:  Beta Blockers Failed - 10/22/2021 12:19 PM      Failed - Valid encounter within last 6 months    Recent Outpatient Visits          1 year ago Pneumonia due to COVID-19 virus   [facility] Sherrie Mustache, Demetrios Isaacs, MD   1 year ago Pneumonia due to COVID-19 virus   [facility] Malva Limes, MD   2 years ago Essential hypertension   [facility] Malva Limes, MD   4 years ago Essential hypertension   [facility] [place], [place]      Future Appointments            In 1 week Sherrie Mustache, Demetrios Isaacs, MD [facility]           Passed - Last BP in normal range    BP Readings from Last 1 Encounters:  05/01/20 108/82         Passed - Last Heart Rate in normal range    Pulse Readings from Last 1  Encounters:  05/01/20 87

## 2021-10-29 ENCOUNTER — Telehealth: Payer: Medicaid Other | Admitting: Family Medicine

## 2021-11-01 ENCOUNTER — Telehealth (INDEPENDENT_AMBULATORY_CARE_PROVIDER_SITE_OTHER): Payer: Medicaid Other | Admitting: Family Medicine

## 2021-11-01 ENCOUNTER — Encounter: Payer: Self-pay | Admitting: Family Medicine

## 2021-11-01 ENCOUNTER — Other Ambulatory Visit: Payer: Self-pay

## 2021-11-01 VITALS — BP 167/91 | HR 75 | Temp 98.9°F | Wt 235.0 lb

## 2021-11-01 DIAGNOSIS — R300 Dysuria: Secondary | ICD-10-CM

## 2021-11-01 DIAGNOSIS — D500 Iron deficiency anemia secondary to blood loss (chronic): Secondary | ICD-10-CM

## 2021-11-01 DIAGNOSIS — I10 Essential (primary) hypertension: Secondary | ICD-10-CM | POA: Diagnosis not present

## 2021-11-01 LAB — POCT URINALYSIS DIPSTICK
Bilirubin, UA: NEGATIVE
Blood, UA: NEGATIVE
Glucose, UA: NEGATIVE
Ketones, UA: NEGATIVE
Nitrite, UA: NEGATIVE
Protein, UA: POSITIVE — AB
Spec Grav, UA: 1.015 (ref 1.010–1.025)
Urobilinogen, UA: 0.2 E.U./dL
pH, UA: 6.5 (ref 5.0–8.0)

## 2021-11-01 NOTE — Progress Notes (Signed)
Established patient visit   Patient: Diane Keller   DOB: 1976-01-24   45 y.o. Female  MRN: [id number] Visit Date: 11/01/2021  Today's healthcare provider: Lelon Huh, MD   Chief Complaint  Patient presents with   Hypertension   Urinary Tract Infection   Anemia   Subjective    Urinary Tract Infection  This is a new problem. The current episode started 1 to 4 weeks ago. The problem has been gradually improving. Associated symptoms include frequency and urgency. Pertinent negatives include no discharge, flank pain, hematuria, nausea or vomiting.   She states it started after using Lune for GU hygiene a few weeks. She has now stopped using it and symptoms are improving.   Hypertension, follow-up  BP Readings from Last 3 Encounters:  11/01/21 (!) 167/91  05/01/20 108/82  04/21/20 127/70   Wt Readings from Last 3 Encounters:  11/01/21 235 lb (106.6 kg)  05/01/20 230 lb (104.3 kg)  04/16/20 263 lb 0.1 oz (119.3 kg)     She was last seen for hypertension was over a year ago.   She reports excellent compliance with treatment. She is not having side effects.  She is following a Regular diet. She is exercising. She does not smoke.  Use of agents associated with hypertension: none.   Outside blood pressures are normal at home. Symptoms: No chest pain No chest pressure  No palpitations No syncope  No dyspnea No orthopnea  No paroxysmal nocturnal dyspnea No lower extremity edema   Pertinent labs: Lab Results  Component Value Date   CHOL 150 05/11/2017   HDL 41 05/11/2017   LDLCALC 94 05/11/2017   TRIG 77 05/11/2017   CHOLHDL 3.7 05/11/2017   Lab Results  Component Value Date   NA 139 05/01/2020   K 4.4 05/01/2020   CREATININE 0.94 05/01/2020   GFRNONAA 73 05/01/2020   GLUCOSE 94 05/01/2020   TSH 1.22 03/22/2012     The ASCVD Risk score (Arnett DK, et al., 2019) failed to calculate for the following reasons:   Cannot find a previous HDL lab   Cannot  find a previous total cholesterol lab   ---------------------------------------------------------------------------------------------------     Medications: Outpatient Medications Prior to Visit  Medication Sig   amLODipine (NORVASC) 10 MG tablet Take 1 tablet (10 mg total) by mouth daily. OFFICE VISIT NEEDED FOR ADDITIONAL REFILLS   labetalol (NORMODYNE) 300 MG tablet Take 1 tablet (300 mg total) by mouth 2 (two) times daily.   omeprazole (PRILOSEC) 20 MG capsule TAKE 1 CAPSULE BY MOUTH EVERY DAY   ferrous sulfate 325 (65 FE) MG EC tablet Take 1 tablet (325 mg total) by mouth daily with breakfast.   lisinopril (ZESTRIL) 20 MG tablet Take 0.5 tablets (10 mg total) by mouth daily. (Patient not taking: Reported on 11/01/2021)   [DISCONTINUED] albuterol (VENTOLIN HFA) 108 (90 Base) MCG/ACT inhaler Inhale 1 puff into the lungs every 4 (four) hours.   [DISCONTINUED] Buprenorphine HCl-Naloxone HCl 8-2 MG FILM Place 2 Film under the tongue daily.   [DISCONTINUED] fluticasone (FLOVENT HFA) 220 MCG/ACT inhaler Inhale 2 puffs into the lungs 2 (two) times daily.   [DISCONTINUED] ipratropium (ATROVENT HFA) 17 MCG/ACT inhaler Inhale 2 puffs into the lungs every 4 (four) hours.   No facility-administered medications prior to visit.    Review of Systems  Constitutional: Negative.   Respiratory: Negative.    Cardiovascular: Negative.   Gastrointestinal: Negative.  Negative for nausea and vomiting.  Genitourinary:  Positive for frequency and urgency. Negative for difficulty urinating, dyspareunia, dysuria, enuresis, flank pain, hematuria, vaginal bleeding, vaginal discharge and vaginal pain.  Neurological:  Negative for dizziness, light-headedness and headaches.      Objective    BP (!) 167/91 (BP Location: Right Arm, Patient Position: Sitting, Cuff Size: Large)    Pulse 75    Temp 98.9 F (37.2 C) (Oral)    Wt 235 lb (106.6 kg)    SpO2 (!) 70%    BMI 37.93 kg/m    Physical Exam   General:  Appearance:    Obese female in no acute distress  Eyes:    PERRL, conjunctiva/corneas clear, EOM's intact       Lungs:     Clear to auscultation bilaterally, respirations unlabored  Heart:    Normal heart rate. Normal rhythm. No murmurs, rubs, or gallops.    MS:   All extremities are intact.    Neurologic:   Awake, alert, oriented x 3. No apparent focal neurological defect.         Results for orders placed or performed in visit on 11/01/21  POCT urinalysis dipstick  Result Value Ref Range   Color, UA     Clarity, UA     Glucose, UA Negative Negative   Bilirubin, UA Negative    Ketones, UA Negative    Spec Grav, UA 1.015 1.010 - 1.025   Blood, UA Negative    pH, UA 6.5 5.0 - 8.0   Protein, UA Positive (A) Negative   Urobilinogen, UA 0.2 0.2 or 1.0 E.U./dL   Nitrite, UA Negative    Leukocytes, UA Trace (A) Negative   Appearance     Odor      Assessment & Plan     1. Primary hypertension She reports her home blood pressures checked by her EMT husband are typically in the 120s/70s is doing well on current medications. Is off lisinopril since having renal failure during Covid infection last year.  - Comprehensive metabolic panel - Lipid panel - CBC - Magnesium  2. Morbid obesity (HCC) D&E  3. Iron deficiency anemia due to chronic blood loss  - Iron, TIBC and Ferritin Panel  4. Dysuria Likely secondary to GU irritation, is improving  - Urine Culture   Recommended flu vaccine which she declined.      The entirety of the information documented in the History of Present Illness, Review of Systems and Physical Exam were personally obtained by me. Portions of this information were initially documented by the CMA and reviewed by me for thoroughness and accuracy.     Mila Merry, MD  [facility] [phone] (phone) [phone] (fax)  [facility]

## 2021-11-01 NOTE — Patient Instructions (Addendum)
.   Please review the attached list of medications and notify my office if there are any errors.   . Please bring all of your medications to every appointment so we can make sure that our medication list is the same as yours.  I recommend that you get a flu vaccine this year. Please call our office at [phone] at your earliest convenience to schedule a flu shot.    

## 2021-11-03 DIAGNOSIS — F4321 Adjustment disorder with depressed mood: Secondary | ICD-10-CM | POA: Diagnosis not present

## 2021-11-03 DIAGNOSIS — F411 Generalized anxiety disorder: Secondary | ICD-10-CM | POA: Diagnosis not present

## 2021-11-08 LAB — URINE CULTURE

## 2021-11-08 LAB — SPECIMEN STATUS REPORT

## 2021-11-09 ENCOUNTER — Telehealth: Payer: Self-pay

## 2021-11-09 ENCOUNTER — Other Ambulatory Visit: Payer: Self-pay | Admitting: Family Medicine

## 2021-11-09 DIAGNOSIS — N390 Urinary tract infection, site not specified: Secondary | ICD-10-CM

## 2021-11-09 DIAGNOSIS — I10 Essential (primary) hypertension: Secondary | ICD-10-CM

## 2021-11-09 MED ORDER — LABETALOL HCL 300 MG PO TABS: 300.0000 mg | ORAL_TABLET | Freq: Two times a day (BID) | ORAL | 2 refills | Status: DC

## 2021-11-09 MED ORDER — AMLODIPINE BESYLATE 10 MG PO TABS: 10.0000 mg | ORAL_TABLET | Freq: Every day | ORAL | 0 refills | Status: DC

## 2021-11-09 MED ORDER — CEPHALEXIN 500 MG PO CAPS: 500.0000 mg | ORAL_CAPSULE | Freq: Three times a day (TID) | ORAL | 0 refills | Status: AC

## 2021-11-09 NOTE — Telephone Encounter (Signed)
-----   Message from Malva Limes, MD sent at 11/09/2021  8:07 AM EST ----- Urine cultures show infection, have sent [facility] to [facility], call if not completely better when finished.

## 2021-11-09 NOTE — Telephone Encounter (Signed)
Patient advised of lab results. Refilled BP medications as well. Patient had a follow up 11/01/21.

## 2022-03-06 ENCOUNTER — Other Ambulatory Visit: Payer: Self-pay | Admitting: Family Medicine

## 2022-03-06 DIAGNOSIS — I10 Essential (primary) hypertension: Secondary | ICD-10-CM

## 2022-03-18 ENCOUNTER — Telehealth: Payer: Self-pay

## 2022-03-18 NOTE — Telephone Encounter (Signed)
Copied from CRM [id number]. Topic: Referral - Status ?>> Mar 18, 2022 12:25 PM Marylen Ponto wrote: ?Reason for CRM: Pt requests referral to Dr. Ronita Hipps for pain management fax# [phone]. Pt requests referral be sent asap so she can get an appt scheduled. ?

## 2022-03-18 NOTE — Telephone Encounter (Signed)
Please advise if patient needs appointment to discuss pain management referral request. I don't see that patient takes any chronic pain medications. Last office visit was 11/01/2021. Labs were ordered but not done.  ?

## 2022-03-21 ENCOUNTER — Telehealth: Payer: Self-pay

## 2022-03-21 ENCOUNTER — Other Ambulatory Visit: Payer: Self-pay

## 2022-03-21 ENCOUNTER — Other Ambulatory Visit: Payer: Self-pay | Admitting: Family Medicine

## 2022-03-21 DIAGNOSIS — I10 Essential (primary) hypertension: Secondary | ICD-10-CM

## 2022-03-21 NOTE — Telephone Encounter (Signed)
Needs office visit.

## 2022-03-21 NOTE — Telephone Encounter (Signed)
Patient called and wanted to know the status of her pain management referral to Dr.Faroo[facility] office. Pt states that she that she takes Buprenorphine HCI Naloxone  (Suboxone), which her PCP is unable to prescribe. She states that the pain management office is waiting to receive medical records from PCP. Please advise pt when referral is completed and records have been sent. ?

## 2022-03-21 NOTE — Telephone Encounter (Signed)
Pt called back in stating she was not needing an appt, that she has already been getting seen for her issues, the only thing she is needing is her medical records sent over to that office so they can have that information, please advise.  ?

## 2022-03-21 NOTE — Telephone Encounter (Signed)
Per Dr. Sherrie Mustache, needs office visit. Patient has been advised and states she will call back later.  ?

## 2022-03-21 NOTE — Telephone Encounter (Signed)
I called patient and advised her that she needs to schedule an appointment. Patient requested to call the office back because she was at work at the time of my call.  ?

## 2022-03-21 NOTE — Telephone Encounter (Signed)
Please advise. Thank you

## 2022-03-23 ENCOUNTER — Ambulatory Visit: Payer: Medicaid Other | Admitting: Physician Assistant

## 2022-06-13 ENCOUNTER — Other Ambulatory Visit: Payer: Self-pay | Admitting: Family Medicine

## 2022-06-13 NOTE — Telephone Encounter (Signed)
[facility] faxed refill request for the following medications:   omeprazole (PRILOSEC) 20 MG capsule  Please advise.  

## 2022-06-14 ENCOUNTER — Encounter: Payer: Self-pay | Admitting: Family Medicine

## 2022-06-14 ENCOUNTER — Ambulatory Visit: Payer: Medicaid Other | Admitting: Family Medicine

## 2022-06-14 VITALS — BP 135/85 | HR 71 | Temp 98.4°F | Resp 16 | Ht 67.0 in | Wt 207.3 lb

## 2022-06-14 DIAGNOSIS — N926 Irregular menstruation, unspecified: Secondary | ICD-10-CM | POA: Diagnosis not present

## 2022-06-14 DIAGNOSIS — R12 Heartburn: Secondary | ICD-10-CM

## 2022-06-14 DIAGNOSIS — D500 Iron deficiency anemia secondary to blood loss (chronic): Secondary | ICD-10-CM | POA: Diagnosis not present

## 2022-06-14 DIAGNOSIS — I10 Essential (primary) hypertension: Secondary | ICD-10-CM

## 2022-06-14 LAB — POCT URINE PREGNANCY: Preg Test, Ur: NEGATIVE

## 2022-06-14 MED ORDER — LABETALOL HCL 300 MG PO TABS: 300.0000 mg | ORAL_TABLET | Freq: Two times a day (BID) | ORAL | 1 refills | Status: DC

## 2022-06-14 MED ORDER — OMEPRAZOLE 20 MG PO CPDR: DELAYED_RELEASE_CAPSULE | ORAL | 1 refills | Status: DC

## 2022-06-14 MED ORDER — LISINOPRIL 20 MG PO TABS: 10.0000 mg | ORAL_TABLET | Freq: Every day | ORAL | 1 refills | Status: AC

## 2022-06-14 MED ORDER — AMLODIPINE BESYLATE 10 MG PO TABS: 10.0000 mg | ORAL_TABLET | Freq: Every day | ORAL | 1 refills | Status: DC

## 2022-06-14 NOTE — Assessment & Plan Note (Signed)
Recheck labs 

## 2022-06-14 NOTE — Assessment & Plan Note (Signed)
Slightly elevated today but normal on home measurements, no changes today. Obtaining labs. F/u next year for physical.

## 2022-06-14 NOTE — Assessment & Plan Note (Signed)
Doing well on current regimen, no changes made today. 

## 2022-06-14 NOTE — Progress Notes (Signed)
    SUBJECTIVE:   CHIEF COMPLAINT / HPI:   Hypertension: - Medications: amlodipine, labetalol, lisinopril - Compliance: good - Checking BP at home: yes, 120-130 SBP. - Denies any SOB, CP, vision changes, LE edema, medication SEs, or symptoms of hypotension - Exercise: active at work  GERD - Meds: prilosec - Symptoms:  none.  - denies dysphagia  denies melena, hematochezia, hematemesis, and coffee ground emesis.   Change in menses - desiring pregnancy test. Reports her last period was lighter than usual, nipples sensitive. Menses regular every month. LMP 05/23/22. Lasted 3 days, usually lasts around 1 week. No contraception, no current desire. Sexually active with husbnad.   OBJECTIVE:   BP 135/85 (BP Location: Left Arm, Patient Position: Sitting, Cuff Size: Normal)   Pulse 71   Temp 98.4 F (36.9 C) (Oral)   Resp 16   Ht 5\' 7"  (1.702 m)   Wt 207 lb 4.8 oz (94 kg)   BMI 32.47 kg/m   Gen: well appearing, in NAD Card: Reg rate Lungs: Comfortable WOB on RA Ext: WWP, no edema   ASSESSMENT/PLAN:   Primary hypertension Slightly elevated today but normal on home measurements, no changes today. Obtaining labs. F/u next year for physical.  Heartburn Doing well on current regimen, no changes made today.  Iron deficiency anemia due to chronic blood loss Recheck labs.  Morbid obesity (HCC) Contributing to HTN. Congratulated on weight loss, continue efforts.     , DO

## 2022-06-14 NOTE — Assessment & Plan Note (Signed)
Contributing to HTN. Congratulated on weight loss, continue efforts.

## 2022-06-15 LAB — CBC WITH DIFFERENTIAL/PLATELET
Basophils Absolute: 0.1 10*3/uL (ref 0.0–0.2)
Basos: 1 %
EOS (ABSOLUTE): 0.5 10*3/uL — ABNORMAL HIGH (ref 0.0–0.4)
Eos: 12 %
Hematocrit: 31.7 % — ABNORMAL LOW (ref 34.0–46.6)
Hemoglobin: 10.9 g/dL — ABNORMAL LOW (ref 11.1–15.9)
Immature Grans (Abs): 0 10*3/uL (ref 0.0–0.1)
Immature Granulocytes: 0 %
Lymphocytes Absolute: 1.1 10*3/uL (ref 0.7–3.1)
Lymphs: 29 %
MCH: 29.3 pg (ref 26.6–33.0)
MCHC: 34.4 g/dL (ref 31.5–35.7)
MCV: 85 fL (ref 79–97)
Monocytes Absolute: 0.3 10*3/uL (ref 0.1–0.9)
Monocytes: 8 %
Neutrophils Absolute: 2 10*3/uL (ref 1.4–7.0)
Neutrophils: 50 %
Platelets: 242 10*3/uL (ref 150–450)
RBC: 3.72 x10E6/uL — ABNORMAL LOW (ref 3.77–5.28)
RDW: 13.8 % (ref 11.7–15.4)
WBC: 4 10*3/uL (ref 3.4–10.8)

## 2022-06-15 LAB — LIPID PANEL
Chol/HDL Ratio: 2.7 ratio (ref 0.0–4.4)
Cholesterol, Total: 162 mg/dL (ref 100–199)
HDL: 61 mg/dL (ref >39)
LDL Chol Calc (NIH): 87 mg/dL (ref 0–99)
Triglycerides: 73 mg/dL (ref 0–149)
VLDL Cholesterol Cal: 14 mg/dL (ref 5–40)

## 2022-06-15 LAB — COMPREHENSIVE METABOLIC PANEL
ALT: 12 IU/L (ref 0–32)
AST: 17 IU/L (ref 0–40)
Albumin/Globulin Ratio: 1.4 (ref 1.2–2.2)
Albumin: 4.2 g/dL (ref 3.9–4.9)
Alkaline Phosphatase: 60 IU/L (ref 44–121)
BUN/Creatinine Ratio: 18 (ref 9–23)
BUN: 16 mg/dL (ref 6–24)
Bilirubin Total: 0.2 mg/dL (ref 0.0–1.2)
CO2: 18 mmol/L — ABNORMAL LOW (ref 20–29)
Calcium: 8.8 mg/dL (ref 8.7–10.2)
Chloride: 106 mmol/L (ref 96–106)
Creatinine, Ser: 0.87 mg/dL (ref 0.57–1.00)
Globulin, Total: 3 g/dL (ref 1.5–4.5)
Glucose: 87 mg/dL (ref 70–99)
Potassium: 4.1 mmol/L (ref 3.5–5.2)
Sodium: 140 mmol/L (ref 134–144)
Total Protein: 7.2 g/dL (ref 6.0–8.5)
eGFR: 82 mL/min/{1.73_m2} (ref >59)

## 2023-01-02 ENCOUNTER — Ambulatory Visit
Admission: RE | Admit: 2023-01-02 | Discharge: 2023-01-02 | Disposition: A | Discharge status: Home / Self Care | Payer: Medicaid Other | Source: Ambulatory Visit | Attending: Physician Assistant | Admitting: Physician Assistant

## 2023-01-02 ENCOUNTER — Ambulatory Visit
Admission: RE | Admit: 2023-01-02 | Discharge: 2023-01-02 | Disposition: A | Discharge status: Home / Self Care | Payer: Medicaid Other | Attending: Physician Assistant | Admitting: Physician Assistant

## 2023-01-02 ENCOUNTER — Ambulatory Visit: Payer: Medicaid Other | Admitting: Physician Assistant

## 2023-01-02 ENCOUNTER — Encounter: Payer: Self-pay | Admitting: Physician Assistant

## 2023-01-02 VITALS — BP 173/98 | HR 66 | Temp 98.8°F | Wt 249.1 lb

## 2023-01-02 DIAGNOSIS — R051 Acute cough: Secondary | ICD-10-CM

## 2023-01-02 DIAGNOSIS — R0989 Other specified symptoms and signs involving the circulatory and respiratory systems: Secondary | ICD-10-CM | POA: Insufficient documentation

## 2023-01-02 DIAGNOSIS — J029 Acute pharyngitis, unspecified: Secondary | ICD-10-CM | POA: Diagnosis not present

## 2023-01-02 DIAGNOSIS — I10 Essential (primary) hypertension: Secondary | ICD-10-CM

## 2023-01-02 DIAGNOSIS — R0981 Nasal congestion: Secondary | ICD-10-CM

## 2023-01-02 MED ORDER — BENZONATATE 100 MG PO CAPS: 100.0000 mg | ORAL_CAPSULE | Freq: Two times a day (BID) | ORAL | 0 refills | Status: DC | PRN

## 2023-01-02 MED ORDER — AZITHROMYCIN 250 MG PO TABS: ORAL_TABLET | ORAL | 0 refills | Status: AC

## 2023-01-02 NOTE — Progress Notes (Unsigned)
     I,Flornce Record R Kanyah Matsushima,acting as a Education administrator for [employer], PA-C.,have documented all relevant documentation on the behalf of Mardene Speak, PA-C,as directed by  [employer], PA-C while in the presence of [employer], PA-C.   Established patient visit   Patient: Diane Keller   DOB: [birth year]   47 y.o. Female  MRN: [id number] Visit Date: 01/02/2023  Today's healthcare provider: Mardene Speak, PA-C   No chief complaint on file.  Subjective    HPI  Cough: Patient complains of cough. Symptoms began 5 days ago. Cough described as productive of green/yellow sputum. Patient denies chills and fever. Associated symptoms include nasal congestion, sore throat, and chest pain . Current treatments have included  OTC pain medications , with poor improvement.   Negative  at home COVID test 2 days ago.  Medications: Outpatient Medications Prior to Visit  Medication Sig   amLODipine (NORVASC) 10 MG tablet Take 1 tablet (10 mg total) by mouth daily.   Buprenorphine HCl-Naloxone HCl 8-2 MG FILM Place under the tongue 3 (three) times daily.   labetalol (NORMODYNE) 300 MG tablet Take 1 tablet (300 mg total) by mouth 2 (two) times daily.   lisinopril (ZESTRIL) 20 MG tablet Take 0.5 tablets (10 mg total) by mouth daily.   omeprazole (PRILOSEC) 20 MG capsule TAKE 1 CAPSULE BY MOUTH EVERY DAY   No facility-administered medications prior to visit.    Review of Systems  {Labs  Heme  Chem  Endocrine  Serology  Results Review (optional):23779}   Objective    There were no vitals taken for this visit. {Show previous vital signs (optional):23777}  Physical Exam  ***  No results found for any visits on 01/02/23.  Assessment & Plan     ***  No follow-ups on file.      {provider attestation***:1}   Mardene Speak, PA-C  [place] [phone] (phone) [phone] (fax)  [place]

## 2023-01-04 NOTE — Progress Notes (Signed)
Please, let pt know that her CXR showed multifocal pneumonia. Her current treatment should be helpful. If she will see no improvement after completion of her current medications, she might need to see me back. Otherwise, she needs to FU with me or Dr. Caryn Section in 6 weeks.

## 2023-01-05 ENCOUNTER — Encounter: Payer: Self-pay | Admitting: Physician Assistant

## 2023-02-17 ENCOUNTER — Ambulatory Visit: Payer: Self-pay | Admitting: *Deleted

## 2023-02-17 ENCOUNTER — Ambulatory Visit (INDEPENDENT_AMBULATORY_CARE_PROVIDER_SITE_OTHER): Payer: Medicaid Other

## 2023-02-17 ENCOUNTER — Ambulatory Visit
Admission: EM | Admit: 2023-02-17 | Discharge: 2023-02-17 | Disposition: A | Discharge status: Home / Self Care | Payer: Medicaid Other | Attending: Emergency Medicine | Admitting: Emergency Medicine

## 2023-02-17 DIAGNOSIS — R051 Acute cough: Secondary | ICD-10-CM | POA: Diagnosis not present

## 2023-02-17 DIAGNOSIS — J209 Acute bronchitis, unspecified: Secondary | ICD-10-CM | POA: Diagnosis not present

## 2023-02-17 MED ORDER — ALBUTEROL SULFATE (2.5 MG/3ML) 0.083% IN NEBU
2.5000 mg | INHALATION_SOLUTION | RESPIRATORY_TRACT | Status: AC
  Administered 2023-02-17: 2.5 mg via RESPIRATORY_TRACT

## 2023-02-17 MED ORDER — VENTOLIN HFA 108 (90 BASE) MCG/ACT IN AERS: 1.0000 | INHALATION_SPRAY | Freq: Four times a day (QID) | RESPIRATORY_TRACT | 0 refills | Status: AC | PRN

## 2023-02-17 MED ORDER — PREDNISONE 10 MG PO TABS: 40.0000 mg | ORAL_TABLET | Freq: Every day | ORAL | 0 refills | Status: AC

## 2023-02-17 MED ORDER — AZITHROMYCIN 250 MG PO TABS: 250.0000 mg | ORAL_TABLET | Freq: Every day | ORAL | 0 refills | Status: DC

## 2023-02-17 NOTE — ED Triage Notes (Signed)
Patient to Urgent Care with complaints of cough/ nasal congestion/ wheezing. Symptoms started three days ago.  No hx of asthma. Pneumonia in February. Feels as though she didn't completely get well.

## 2023-02-17 NOTE — Discharge Instructions (Addendum)
Use the albuterol inhaler as directed.  Take the prednisone and Zithromax as directed.  Follow up with your primary care provider on Monday.

## 2023-02-17 NOTE — ED Provider Notes (Signed)
[facility]    CSN: [id number] Arrival date & time: 02/17/23  1306      History   Chief Complaint Chief Complaint  Patient presents with   Cough   Wheezing    HPI Diane Keller is a 47 y.o. female.  Patient presents with congestion, cough, wheezing x 3 days.  She recently had pneumonia and states her cough never really resolved but did improve and she felt better until now.  She denies fever, chills, shortness of breath, chest pain, or other symptoms.   Patient was seen by her PCP on 01/02/2023; diagnosed with abnormal lung sounds, acute cough, chest congestion, nasal congestion, sore throat, essential hypertension; treated with Zithromax and Tessalon Perles; chest x-ray showed multifocal pneumonia.  She was hospitalized in 2021 with acute respiratory failure, sepsis, COVID pneumonia.    The history is provided by the patient and medical records.    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 Authorizing Provider  albuterol (VENTOLIN HFA) 108 (90 Base) MCG/ACT inhaler Inhale 1-2 puffs into the lungs every 6 (six) hours as needed for  wheezing or shortness of breath. 02/17/23  Yes Mickie Bailate, Keyvin Rison H, NP  azithromycin (ZITHROMAX) 250 MG tablet Take 1 tablet (250 mg total) by mouth daily. Take first 2 tablets together, then 1 every day until finished. 02/17/23  Yes Mickie Bailate, Ignacio Lowder H, NP  predniSONE (DELTASONE) 10 MG tablet Take 4 tablets (40 mg total) by mouth daily for 3 days. 02/17/23 02/20/23 Yes Mickie Bailate, Kedra Mcglade H, NP  amLODipine (NORVASC) 10 MG tablet Take 1 tablet (10 mg total) by mouth daily. 06/14/22   Caro Larocheumball, Alison M, DO  benzonatate (TESSALON) 100 MG capsule Take 1 capsule (100 mg total) by mouth 2 (two) times daily as needed for cough. 01/02/23   Debera Latstwalt, Janna, PA-C  Buprenorphine HCl-Naloxone HCl 8-2 MG FILM Place under the tongue 3 (three) times daily. 02/11/22   [provider]  labetalol (NORMODYNE) 300 MG tablet Take 1 tablet (300 mg total) by mouth 2 (two) times daily. 06/14/22   Caro Larocheumball, Alison M, DO  lisinopril (ZESTRIL) 20 MG tablet Take 0.5 tablets (10 mg total) by mouth daily. 06/14/22   Caro Larocheumball, Alison M, DO  omeprazole (PRILOSEC) 20 MG capsule TAKE 1 CAPSULE BY MOUTH EVERY DAY 06/14/22  Caro Larocheumball, Alison M, DO    Family History Family History  Problem Relation Age of Onset   Hypertension Father    Heart attack Father    Cerebrovascular Accident Father     Social History Social History   Tobacco Use   Smoking status: Former    Types: Cigarettes   Smokeless tobacco: Never  Substance Use Topics   Alcohol use: Yes   Drug use: No     Allergies   Augmentin [amoxicillin-pot clavulanate] and Lisinopril   Review of Systems Review of Systems  Constitutional:  Negative for chills and fever.  HENT:  Positive for congestion. Negative for ear pain and sore throat.   Respiratory:  Positive for cough and wheezing. Negative for shortness of breath.   Cardiovascular:  Negative for chest pain and palpitations.  Skin:  Negative for color change and rash.  All other systems reviewed and are negative.    Physical  Exam Triage Vital Signs ED Triage Vitals  Enc Vitals Group     BP 02/17/23 1319 (!) 141/85     Pulse Rate 02/17/23 1315 71     Resp 02/17/23 1315 18     Temp 02/17/23 1315 97.6 F (36.4 C)     Temp src --      SpO2 02/17/23 1315 92 %     Weight --      Height --      Head Circumference --      Peak Flow --      Pain Score 02/17/23 1318 0     Pain Loc --      Pain Edu? --      Excl. in GC? --    No data found.  Updated Vital Signs BP (!) 141/85   Pulse 71   Temp 97.6 F (36.4 C)   Resp 18   LMP 02/14/2023   SpO2 93%   Visual Acuity Right Eye Distance:   Left Eye Distance:   Bilateral Distance:    Right Eye Near:   Left Eye Near:    Bilateral Near:     Physical Exam Vitals and nursing note reviewed.  Constitutional:      General: She is not in acute distress.    Appearance: Normal appearance. She is well-developed. She is not ill-appearing.  HENT:     Right Ear: Tympanic membrane normal.     Left Ear: Tympanic membrane normal.     Nose: Nose normal.     Mouth/Throat:     Mouth: Mucous membranes are moist.     Pharynx: Oropharynx is clear.  Eyes:     Conjunctiva/sclera: Conjunctivae normal.  Cardiovascular:     Rate and Rhythm: Normal rate and regular rhythm.     Heart sounds: Normal heart sounds.  Pulmonary:     Effort: Pulmonary effort is normal. No respiratory distress.     Breath sounds: Normal breath sounds.  Musculoskeletal:     Cervical back: Neck supple.  Skin:    General: Skin is warm and dry.  Neurological:     Mental Status: She is alert.  Psychiatric:        Mood and Affect: Mood normal.        Behavior: Behavior normal.      UC Treatments / Results  Labs (all labs ordered are listed, but only abnormal results are displayed) Labs Reviewed - No data to display  EKG   Radiology DG Chest 2 View  Result Date: 02/17/2023 CLINICAL DATA:  Cough and congestion.  Recent pneumonia. EXAM: CHEST - 2 VIEW COMPARISON:  Radiographs  01/02/2023 and 04/13/2020. FINDINGS: The heart size and mediastinal contours are stable. There is diffuse central airway thickening without residual or recurrent focal airspace disease. There is no pleural effusion or pneumothorax. No acute osseous findings are evident. Mild chronic compression deformities in the midthoracic spine are grossly stable. IMPRESSION: Central airway thickening consistent with bronchitis. No evidence of residual or recurrent pneumonia. Electronically Signed   By: Carey Bullocks M.D.   On: 02/17/2023 14:17    Procedures Procedures (including critical care time)  Medications Ordered in UC Medications  albuterol (PROVENTIL) (2.5 MG/3ML) 0.083% nebulizer solution 2.5 mg (2.5 mg Nebulization Given 02/17/23 1324)    Initial Impression / Assessment and Plan / UC Course  I have reviewed the triage vital signs and the nursing notes.  Pertinent labs & imaging results that were available during my care of the patient were reviewed by me and considered in my medical decision making (see chart for details).    Acute bronchitis, cough.  Chest x-ray shows pneumonia has cleared and patient has bronchitis.  Albuterol nebulizer treatment given here and patient reports improvement of her symptoms.  Treating with albuterol inhaler, prednisone, Zithromax.  Instructed patient to follow-up with her PCP on Monday.  ED precautions given.  Education provided on acute bronchitis.  Patient agrees to plan of care.  Final Clinical Impressions(s) / UC Diagnoses   Final diagnoses:  Acute cough  Acute bronchitis, unspecified organism     Discharge Instructions      Use the albuterol inhaler as directed.  Take the prednisone and Zithromax as directed.  Follow up with your primary care provider on Monday.        ED Prescriptions     Medication Sig Dispense Auth. Provider   albuterol (VENTOLIN HFA) 108 (90 Base) MCG/ACT inhaler Inhale 1-2 puffs into the lungs every 6 (six) hours as needed  for wheezing or shortness of breath. 54 g Mickie Bail, NP   predniSONE (DELTASONE) 10 MG tablet Take 4 tablets (40 mg total) by mouth daily for 3 days. 12 tablet Mickie Bail, NP   azithromycin (ZITHROMAX) 250 MG tablet Take 1 tablet (250 mg total) by mouth daily. Take first 2 tablets together, then 1 every day until finished. 6 tablet Mickie Bail, NP      PDMP not reviewed this encounter.   Mickie Bail, NP 02/17/23 1431

## 2023-02-17 NOTE — Telephone Encounter (Signed)
  Chief Complaint: wheezing, cough, nasal congestion Symptoms: see above Frequency: 12/23/22- treated for pneumonia - symptoms never fully resolved Pertinent Negatives: Patient denies fever Disposition: [] ED /[x] Urgent Care (no appt availability in office) / [] Appointment(In office/virtual)/ []  [facility]/ [] Home Care/ [] Refused Recommended Disposition /[] [facility]/ []  Follow-up with PCP Additional Notes: No open appointment- (office closed for lunch) UC advised

## 2023-02-17 NOTE — Telephone Encounter (Signed)
Summary: Wheezing/coughing up greenish mucus   Patient called in stated she is wheezing but not having trouble breathing although she can only breath through her mouth. She is coughing up greenish mucus. Severe head congestion. No fever and no aches     Reason for Disposition  [1] Longstanding difficulty breathing (e.g., CHF, COPD, emphysema) AND [2] WORSE than normal  Answer Assessment - Initial Assessment Questions 1. RESPIRATORY STATUS: "Describe your breathing?" (e.g., wheezing, shortness of breath, unable to speak, severe coughing)      Wheezing,coughing  2. ONSET: "When did this breathing problem begin?"      12/23/22 3. PATTERN "Does the difficult breathing come and go, or has it been constant since it started?"      constant 4. SEVERITY: "How bad is your breathing?" (e.g., mild, moderate, severe)    - MILD: No SOB at rest, mild SOB with walking, speaks normally in sentences, can lie down, no retractions, pulse < 100.    - MODERATE: SOB at rest, SOB with minimal exertion and prefers to sit, cannot lie down flat, speaks in phrases, mild retractions, audible wheezing, pulse 100-120.    - SEVERE: Very SOB at rest, speaks in single words, struggling to breathe, sitting hunched forward, retractions, pulse > 120      mild 5. RECURRENT SYMPTOM: "Have you had difficulty breathing before?" If Yes, ask: "When was the last time?" and "What happened that time?"      Yes- URI- antibiotic 6. CARDIAC HISTORY: "Do you have any history of heart disease?" (e.g., heart attack, angina, bypass surgery, angioplasty)      hypertension 7. LUNG HISTORY: "Do you have any history of lung disease?"  (e.g., pulmonary embolus, asthma, emphysema)     Hx bronchitis 8. CAUSE: "What do you think is causing the breathing problem?"      infection 9. OTHER SYMPTOMS: "Do you have any other symptoms? (e.g., dizziness, runny nose, cough, chest pain, fever)     nasal drainage, cough, congestion  Protocols used: Breathing  Difficulty-A-AH

## 2023-03-03 ENCOUNTER — Ambulatory Visit
Admission: EM | Admit: 2023-03-03 | Discharge: 2023-03-03 | Disposition: A | Discharge status: Home / Self Care | Payer: Medicaid Other | Attending: Emergency Medicine | Admitting: Emergency Medicine

## 2023-03-03 ENCOUNTER — Ambulatory Visit (INDEPENDENT_AMBULATORY_CARE_PROVIDER_SITE_OTHER): Payer: Medicaid Other

## 2023-03-03 DIAGNOSIS — R052 Subacute cough: Secondary | ICD-10-CM

## 2023-03-03 DIAGNOSIS — R0602 Shortness of breath: Secondary | ICD-10-CM

## 2023-03-03 DIAGNOSIS — I10 Essential (primary) hypertension: Secondary | ICD-10-CM | POA: Diagnosis not present

## 2023-03-03 MED ORDER — ALBUTEROL SULFATE (2.5 MG/3ML) 0.083% IN NEBU
2.5000 mg | INHALATION_SOLUTION | Freq: Once | RESPIRATORY_TRACT | Status: AC
  Administered 2023-03-03: 2.5 mg via RESPIRATORY_TRACT

## 2023-03-03 MED ORDER — PREDNISONE 10 MG (21) PO TBPK: ORAL_TABLET | Freq: Every day | ORAL | 0 refills | Status: DC

## 2023-03-03 MED ORDER — ALBUTEROL SULFATE (2.5 MG/3ML) 0.083% IN NEBU: 2.5000 mg | INHALATION_SOLUTION | Freq: Four times a day (QID) | RESPIRATORY_TRACT | 0 refills | Status: DC | PRN

## 2023-03-03 NOTE — Discharge Instructions (Addendum)
Continue to use the albuterol inhaler or nebulizer treatment as directed.  Take the prednisone as directed.  Follow up with your primary care provider on Monday.    Go to the emergency department if you have shortness of breath.   Your blood pressure is elevated today at 155/111; repeat 154/94.  Please have this rechecked by your primary care provider next week.

## 2023-03-03 NOTE — ED Provider Notes (Signed)
[name]    CSN: [id number] Arrival date & time: 03/03/23  1549      History   Chief Complaint Chief Complaint  Patient presents with   Cough   Shortness of Breath    HPI Diane Keller is a 47 y.o. female. Patient presents with ongoing congestion, cough, wheezing, and shortness of breath x 3 weeks. She recently completed antibiotic and steroid for bronchitis.  Her symptoms improved but then became worse again.  She denies fever, chest pain, or other symptoms.     Patient was seen here on 02/17/2023; diagnosed with acute bronchitis and cough; chest x-ray showed central airway thickening consistent with bronchitis and no evidence of residual or recurrent pneumonia; treated with albuterol inhaler, prednisone, Zithromax.     Patient was seen by her PCP on 01/02/2023 and diagnosed with abnormal lung sounds, acute cough, chest congestion, nasal congestion, sore throat, and essential hypertension; CXR showed multifocal pneumonia; she was treated with Zithromax and Tessalon Perles.   Her medical history includes hypertension, stroke, renal failure, morbid obesity.    The history is provided by the patient and medical records.    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 Authorizing Provider  albuterol (PROVENTIL) (2.5 MG/3ML) 0.083% nebulizer solution Take 3 mLs (2.5 mg total) by nebulization every 6 (six) hours as needed for wheezing or shortness of breath. 03/03/23  Yes Mickie Bail, NP  predniSONE (STERAPRED UNI-PAK 21 TAB) 10 MG (21) TBPK tablet Take by mouth daily. As directed 03/03/23  Yes Mickie Bail, NP  albuterol (VENTOLIN HFA) 108 (90 Base) MCG/ACT inhaler Inhale 1-2 puffs into the lungs every 6 (six) hours as needed for wheezing or shortness of breath. 02/17/23   Mickie Bail, NP  amLODipine (NORVASC) 10 MG tablet Take 1 tablet (10 mg total) by mouth daily. 06/14/22   Caro Laroche, DO  Buprenorphine HCl-Naloxone HCl 8-2 MG FILM Place under the tongue 3 (three) times daily. 02/11/22   [provider]  labetalol (NORMODYNE) 300 MG tablet Take 1 tablet (300 mg total) by mouth 2 (two) times daily. 06/14/22   Caro Laroche, DO  lisinopril (ZESTRIL) 20 MG tablet Take 0.5 tablets (10 mg total) by mouth daily. 06/14/22   Caro Laroche, DO  omeprazole (PRILOSEC) 20 MG capsule TAKE 1 CAPSULE BY MOUTH EVERY DAY 06/14/22   Caro Laroche, DO    Family History Family History  Problem Relation Age of Onset   Hypertension Father    Heart attack Father    Cerebrovascular Accident Father     Social History Social History   Tobacco Use   Smoking status: Former    Types: Cigarettes   Smokeless tobacco: Never  Substance Use Topics   Alcohol use: Yes   Drug use: No     Allergies   Augmentin [amoxicillin-pot clavulanate] and Lisinopril   Review of Systems Review of Systems  Constitutional:  Negative for chills and fever.  HENT:  Positive for congestion, postnasal drip and rhinorrhea. Negative for ear pain and sore throat.   Respiratory:  Positive for cough, shortness of breath and wheezing.   Cardiovascular:   Negative for chest pain and palpitations.  Gastrointestinal:  Negative for diarrhea and vomiting.  Skin:  Negative for color change and rash.  All other systems reviewed and are negative.    Physical Exam Triage Vital Signs ED Triage Vitals  Enc Vitals Group     BP      Pulse      Resp      Temp      Temp src      SpO2      Weight      Height      Head Circumference      Peak Flow      Pain Score      Pain Loc      Pain Edu?      Excl. in GC?    No data found.  Updated Vital Signs BP (!) 154/94   Pulse 70   Temp 98.4 F (36.9 C)   Resp 20   LMP 02/14/2023   SpO2 94%   Visual Acuity Right Eye Distance:   Left Eye Distance:   Bilateral Distance:    Right Eye Near:   Left Eye Near:    Bilateral Near:     Physical Exam Vitals and nursing note reviewed.  Constitutional:      General: She is not in acute distress.    Appearance: She is well-developed. She is not ill-appearing.  HENT:     Right Ear: Tympanic membrane normal.     Left Ear: Tympanic membrane normal.     Nose: Nose normal.     Mouth/Throat:     Mouth: Mucous membranes are moist.     Pharynx: Oropharynx is clear.  Cardiovascular:     Rate and Rhythm: Normal rate and regular rhythm.     Heart sounds: Normal heart sounds.  Pulmonary:     Effort: Pulmonary effort is normal. No respiratory distress.     Breath sounds: Wheezing present.     Comments: Bilateral scattered expiratory wheezes.  Albuterol neb treatment given and only faint expiratory wheezes after.  Musculoskeletal:     Cervical back: Neck supple.  Skin:    General: Skin is warm and dry.  Neurological:     Mental Status: She is alert.  Psychiatric:        Mood and Affect: Mood normal.        Behavior: Behavior normal.      UC Treatments / Results  Labs (all labs ordered are listed, but only abnormal results are displayed) Labs Reviewed - No data to display  EKG   Radiology DG Chest 2 View  Result Date:  03/03/2023 CLINICAL DATA:  Cough and shortness of breath. EXAM: CHEST - 2 VIEW COMPARISON:  02/17/2023 FINDINGS: Lungs are adequately inflated without focal airspace consolidation or effusion. Cardiomediastinal silhouette and remainder of the exam is unchanged. IMPRESSION: No active cardiopulmonary disease. Electronically Signed   By: Elberta Fortis M.D.   On: 03/03/2023 16:43    Procedures Procedures (including critical care time)  Medications Ordered in UC Medications  albuterol (PROVENTIL) (2.5 MG/3ML) 0.083% nebulizer solution 2.5 mg (2.5 mg Nebulization Given 03/03/23 1643)    Initial Impression / Assessment and Plan / UC Course  I have reviewed the triage vital signs and the nursing notes.  Pertinent labs & imaging results that were available during my care of the patient were reviewed by me and considered in my medical decision making (see chart for details).   Shortness of breath, subacute cough, elevated blood pressure reading with hypertension.  Chest x-ray normal.  Albuterol nebulizer treatment given here and patient has improved air movement with decreased wheezing after.  Discharging patient with prescription for albuterol nebulizer solution and prednisone taper.  She states she has the nebulizer machine at home already.  Instructed her to follow-up with her PCP on Monday.  ED precautions given.  Education provided on shortness of breath and cough.  Also discussed with patient that her blood pressure is elevated today and needs to be rechecked by PCP next week.  Education provided on managing hypertension.  She agrees to plan of care.   Final Clinical Impressions(s) / UC Diagnoses   Final diagnoses:  Shortness of breath  Subacute cough  Elevated blood pressure reading in office with diagnosis of hypertension     Discharge Instructions      Continue to use the albuterol inhaler or nebulizer treatment as directed.  Take the prednisone as directed.  Follow up with your primary  care provider on Monday.    Go to the emergency department if you have shortness of breath.   Your blood pressure is elevated today at 155/111; repeat 154/94.  Please have this rechecked by your primary care provider next week.          ED Prescriptions     Medication Sig Dispense Auth. Provider   predniSONE (STERAPRED UNI-PAK 21 TAB) 10 MG (21) TBPK tablet Take by mouth daily. As directed 21 tablet Mickie Bail, NP   albuterol (PROVENTIL) (2.5 MG/3ML) 0.083% nebulizer solution Take 3 mLs (2.5 mg total) by nebulization every 6 (six) hours as needed for wheezing or shortness of breath. 75 mL Mickie Bail, NP      PDMP not reviewed this encounter.   Mickie Bail, NP 03/03/23 [phone]

## 2023-03-03 NOTE — ED Triage Notes (Signed)
Patient to Urgent Care with complaints of cough/ chest congestion/ [facility] w/ exertion.   Reports concerns about bronchitis. Symptoms started 4/3. Reports finishing course of abx prescribed 4/5 and states that her wheezing and symptoms did improve initially but then worsened.

## 2023-03-06 ENCOUNTER — Ambulatory Visit: Payer: Self-pay

## 2023-03-06 NOTE — Progress Notes (Deleted)
I,Sulibeya S Dimas,acting as a scribe for Mila Merry, MD.,have documented all relevant documentation on the behalf of Mila Merry, MD,as directed by  Mila Merry, MD while in the presence of Mila Merry, MD.     Established patient visit   Patient: Diane Keller   DOB: 04/08/1976   48 y.o. Female  MRN: [id number] Visit Date: 03/07/2023  Today's healthcare provider: Mila Merry, MD   No chief complaint on file.  Subjective    HPI  Patient is a 47 year old female who presents for follow up of shortness of breath and bronchitis.  Patient was seen at urgent care on 02/17/23 and 03/03/23.  On 02/17/23 patient had symptoms of cough, congestion and wheezing.  She denied at that time any fever, chills, shortness of breath, and chest pain.   She had reported to them that she had previously been seen by her PCP on 01/02/23 and treated for pneumonia with Zithromax and Tessalon Perles.  She stated that her cough had never fully resolved from that episode.  She was diagnosed with acute cough and acute bronchitis.  She was treated with albuterol inhaler, Prednisone and Zithromax.    On 03/03/23 patient presented back at urgent care with symptoms of ongoing cough and congestion, wheezing and shortness of breath for 3 weeks.  She reported completing the antibiotics and steroids. She reported improvement but then symptoms got worse again.  She again denied fever, chills and chest pain.  Presents with symptoms of cough, congestion and wheezing and now with the added symptoms of postnasal drainage, rhinorrhea and shortness of breath.  Patient received Albuterol nebulizer treatment in office with improvement of air movement and decreased wheezes after treatment. Another round or Prednisone was prescribed.    Patient was advised during the 03/03/23 visit to see PCP for evaluation and possible treatment of elevated blood pressure.  Her last readings were as follows.  However the last 3 readings were all  during time of illness.  She is presently on Lisinopril, Labetalol and Amlodipine for her blood pressure.   BP Readings from Last 3 Encounters:  03/03/23 (!) 154/94  02/17/23 (!) 141/85  01/02/23 (!) 173/98     Medications: Outpatient Medications Prior to Visit  Medication Sig   albuterol (PROVENTIL) (2.5 MG/3ML) 0.083% nebulizer solution Take 3 mLs (2.5 mg total) by nebulization every 6 (six) hours as needed for wheezing or shortness of breath.   albuterol (VENTOLIN HFA) 108 (90 Base) MCG/ACT inhaler Inhale 1-2 puffs into the lungs every 6 (six) hours as needed for wheezing or shortness of breath.   amLODipine (NORVASC) 10 MG tablet Take 1 tablet (10 mg total) by mouth daily.   Buprenorphine HCl-Naloxone HCl 8-2 MG FILM Place under the tongue 3 (three) times daily.   labetalol (NORMODYNE) 300 MG tablet Take 1 tablet (300 mg total) by mouth 2 (two) times daily.   lisinopril (ZESTRIL) 20 MG tablet Take 0.5 tablets (10 mg total) by mouth daily.   omeprazole (PRILOSEC) 20 MG capsule TAKE 1 CAPSULE BY MOUTH EVERY DAY   predniSONE (STERAPRED UNI-PAK 21 TAB) 10 MG (21) TBPK tablet Take by mouth daily. As directed   No facility-administered medications prior to visit.    Review of Systems  {Labs  Heme  Chem  Endocrine  Serology  Results Review (optional):23779}   Objective    LMP 02/14/2023  {Show previous vital signs (optional):23777}  Physical Exam  ***  No results found for any visits on 03/07/23.  Assessment & Plan     ***  No follow-ups on file.      {provider attestation***:1}   Mila Merry, MD  [facility] [phone] (phone) [phone] (fax)  [facility]

## 2023-03-06 NOTE — Telephone Encounter (Signed)
  Chief Complaint: Mild SOB - improving Symptoms: Runny nose, SOB Frequency: 3 weeks Pertinent Negatives: Patient denies fever, chest pain Disposition: ED /[] Urgent Care (no appt availability in office) / Appointment(In office/virtual)/  [facility]/ Home Care/ Refused Recommended Disposition /[] [facility]/  Follow-up with PCP Additional Notes: PT was seen at ED for SOB on Friday. PT was given prednisone and albuterol for nebulizer. PT states she is feeling a little better.     Reason for Disposition  [1] MODERATE longstanding difficulty breathing (e.g., speaks in phrases, SOB even at rest, pulse 100-120) AND [2] SAME as normal  Answer Assessment - Initial Assessment Questions 1. RESPIRATORY STATUS: "Describe your breathing?" (e.g., wheezing, shortness of breath, unable to speak, severe coughing)      A little SOB 2. ONSET: "When did this breathing problem begin?"      3 weeks ago 3. PATTERN "Does the difficult breathing come and go, or has it been constant since it started?"      constant 4. SEVERITY: "How bad is your breathing?" (e.g., mild, moderate, severe)    - MILD: No SOB at rest, mild SOB with walking, speaks normally in sentences, can lie down, no retractions, pulse < 100.    - MODERATE: SOB at rest, SOB with minimal exertion and prefers to sit, cannot lie down flat, speaks in phrases, mild retractions, audible wheezing, pulse 100-120.    - SEVERE: Very SOB at rest, speaks in single words, struggling to breathe, sitting hunched forward, retractions, pulse > 120      Mild - getting better 5. RECURRENT SYMPTOM: "Have you had difficulty breathing before?" If Yes, ask: "When was the last time?" and "What happened that time?"      Bronchitis 6. CARDIAC HISTORY: "Do you have any history of heart disease?" (e.g., heart attack, angina, bypass surgery, angioplasty)      Heart - HTN 7. LUNG HISTORY: "Do you have any history of lung disease?"  (e.g.,  pulmonary embolus, asthma, emphysema)      8. CAUSE: "What do you think is causing the breathing problem?"      URI  9. OTHER SYMPTOMS: "Do you have any other symptoms? (e.g., dizziness, runny nose, cough, chest pain, fever)     No - runny nose 10. O2 SATURATION MONITOR:  "Do you use an oxygen saturation monitor (pulse oximeter) at home?" If Yes, ask: "What is your reading (oxygen level) today?" "What is your usual oxygen saturation reading?" (e.g., 95%)  Protocols used: Breathing Difficulty-A-AH

## 2023-03-07 ENCOUNTER — Inpatient Hospital Stay: Payer: Medicaid Other | Admitting: Family Medicine

## 2023-03-20 ENCOUNTER — Other Ambulatory Visit: Payer: Self-pay | Admitting: Family Medicine

## 2023-03-20 DIAGNOSIS — I10 Essential (primary) hypertension: Secondary | ICD-10-CM

## 2023-03-20 DIAGNOSIS — R12 Heartburn: Secondary | ICD-10-CM

## 2023-03-21 NOTE — Telephone Encounter (Signed)
Requested Prescriptions  Pending Prescriptions Disp Refills   omeprazole (PRILOSEC) 20 MG capsule [Pharmacy Med Name: OMEPRAZOLE DR 20 MG CAPSULE] 90 capsule 0    Sig: TAKE 1 CAPSULE BY MOUTH EVERY DAY     Gastroenterology: Proton Pump Inhibitors Passed - 03/20/2023  8:12 PM      Passed - Valid encounter within last 12 months    Recent Outpatient Visits           2 months ago Abnormal lung sounds   [facility] [place], [place], PA-C   9 months ago Iron deficiency anemia due to chronic blood loss   [facility] [place], Darl Householder, DO   1 year ago Primary hypertension   [facility] Malva Limes, MD   2 years ago Pneumonia due to COVID-19 virus   [facility] Malva Limes, MD   2 years ago Pneumonia due to COVID-19 virus   [facility] Malva Limes, MD               amLODipine (NORVASC) 10 MG tablet [Pharmacy Med Name: AMLODIPINE BESYLATE 10 MG TAB] 90 tablet 0    Sig: TAKE 1 TABLET BY MOUTH EVERY DAY     Cardiovascular: Calcium Channel Blockers 2 Failed - 03/20/2023  8:12 PM      Failed - Last BP in normal range    BP Readings from Last 1 Encounters:  03/03/23 (!) 154/94         Passed - Last Heart Rate in normal range    Pulse Readings from Last 1 Encounters:  03/03/23 70         Passed - Valid encounter within last 6 months    Recent Outpatient Visits           2 months ago Abnormal lung sounds   [place] [facility] [place], [place], PA-C   9 months ago Iron deficiency anemia due to chronic blood loss   [facility] [place], Darl Householder, DO   1 year ago Primary hypertension   [place] Malva Limes, MD   2 years ago Pneumonia due to COVID-19 virus   [facility] Malva Limes, MD   2 years ago Pneumonia due to COVID-19 virus    [facility] Malva Limes, MD               labetalol (NORMODYNE) 300 MG tablet [Pharmacy Med Name: LABETALOL HCL 300 MG TABLET] 180 tablet 0    Sig: TAKE 1 TABLET BY MOUTH 2 TIMES DAILY.     Cardiovascular:  Beta Blockers Failed - 03/20/2023  8:12 PM      Failed - Last BP in normal range    BP Readings from Last 1 Encounters:  03/03/23 (!) 154/94         Passed - Last Heart Rate in normal range    Pulse Readings from Last 1 Encounters:  03/03/23 70         Passed - Valid encounter within last 6 months    Recent Outpatient Visits           2 months ago Abnormal lung sounds   [facility] [place], [place], PA-C   9 months ago Iron deficiency anemia due to chronic blood loss   [facility] [place]  M, DO   1 year ago Primary hypertension   [place] [facility] Malva Limes, MD   2 years ago Pneumonia due to COVID-19 virus   [facility] Malva Limes, MD   2 years ago Pneumonia due to COVID-19 virus   [facility] Malva Limes, MD

## 2023-04-27 ENCOUNTER — Ambulatory Visit: Payer: Medicaid Other | Admitting: Physician Assistant

## 2023-04-27 ENCOUNTER — Encounter: Payer: Self-pay | Admitting: Physician Assistant

## 2023-04-27 VITALS — BP 120/90 | HR 73 | Ht 67.0 in | Wt 244.8 lb

## 2023-04-27 DIAGNOSIS — R051 Acute cough: Secondary | ICD-10-CM | POA: Diagnosis not present

## 2023-04-27 DIAGNOSIS — R0981 Nasal congestion: Secondary | ICD-10-CM | POA: Diagnosis not present

## 2023-04-27 DIAGNOSIS — Z8701 Personal history of pneumonia (recurrent): Secondary | ICD-10-CM | POA: Diagnosis not present

## 2023-04-27 DIAGNOSIS — R0989 Other specified symptoms and signs involving the circulatory and respiratory systems: Secondary | ICD-10-CM | POA: Diagnosis not present

## 2023-04-27 DIAGNOSIS — Z8709 Personal history of other diseases of the respiratory system: Secondary | ICD-10-CM

## 2023-04-27 MED ORDER — ALBUTEROL SULFATE (2.5 MG/3ML) 0.083% IN NEBU
2.5000 mg | INHALATION_SOLUTION | Freq: Four times a day (QID) | RESPIRATORY_TRACT | 1 refills | Status: AC | PRN
Start: 2023-04-27 — End: ?

## 2023-04-27 MED ORDER — PREDNISONE 10 MG (21) PO TBPK
ORAL_TABLET | Freq: Every day | ORAL | 0 refills | Status: DC
Start: 2023-04-27 — End: 2023-05-03

## 2023-04-27 NOTE — Progress Notes (Signed)
I,Sha'taria Tyson,acting as a Neurosurgeon for [employer], PA-C.,have documented all relevant documentation on the behalf of Diane Lat, PA-C,as directed by  [employer], PA-C while in the presence of [employer], PA-C.   Established patient visit   Patient: Diane Keller   DOB: 11-Sep-1976   47 y.o. Female  MRN: [id number] Visit Date: 04/27/2023  Today's healthcare provider: [name], PA-C   No chief complaint on file.  Subjective      Discussed the use of AI scribe software for clinical note transcription with the patient, who gave verbal consent to proceed.  History of Present Illness         The patient, with a history of pneumonia and bronchitis, presents with worsening respiratory symptoms over the past 2-3 days. They describe severe nasal congestion, green nasal discharge, coughing, shortness of breath, and wheezing. Take sudafed without relief. They deny any history of asthma. They report a slight fever earlier but deny any current fever. They also deny any dizziness. They report a history of smoking but quit over 15-20 years ago. They deny any recent exposure to smoke but mention a coworker was recently sick with similar symptoms. They have been taking over-the-counter medications with no relief. They have been previously treated with a nebulizer , Zpack and prednisone for possible bronchitis in April, which they report helped their symptoms. They deny any current chest pain or rapid heartbeats.  Medications: Outpatient Medications Prior to Visit  Medication Sig   albuterol (VENTOLIN HFA) 108 (90 Base) MCG/ACT inhaler Inhale 1-2 puffs into the lungs every 6 (six) hours as needed for wheezing or shortness of breath.   amLODipine (NORVASC) 10 MG tablet TAKE 1 TABLET BY MOUTH EVERY DAY   Buprenorphine HCl-Naloxone HCl 8-2 MG FILM Place under the tongue 3 (three) times daily.   labetalol (NORMODYNE) 300 MG tablet TAKE 1 TABLET BY MOUTH 2 TIMES DAILY.   lisinopril  (ZESTRIL) 20 MG tablet Take 0.5 tablets (10 mg total) by mouth daily.   omeprazole (PRILOSEC) 20 MG capsule TAKE 1 CAPSULE BY MOUTH EVERY DAY   [DISCONTINUED] albuterol (PROVENTIL) (2.5 MG/3ML) 0.083% nebulizer solution Take 3 mLs (2.5 mg total) by nebulization every 6 (six) hours as needed for wheezing or shortness of breath. (Patient not taking: Reported on 04/27/2023)   [DISCONTINUED] predniSONE (STERAPRED UNI-PAK 21 TAB) 10 MG (21) TBPK tablet Take by mouth daily. As directed (Patient not taking: Reported on 04/27/2023)   No facility-administered medications prior to visit.    Review of Systems  HENT:  Positive for postnasal drip.   Respiratory:  Positive for cough, shortness of breath and wheezing.   Musculoskeletal:  Positive for myalgias.       Objective    BP (!) 120/90 (BP Location: Right Arm, Patient Position: Sitting, Cuff Size: Large)   Pulse 73   Ht 5\' 7"  (1.702 m)   Wt 244 lb 12.8 oz (111 kg)   SpO2 96%   BMI 38.34 kg/m    Physical Exam Vitals reviewed.  Constitutional:      General: She is not in acute distress.    Appearance: Normal appearance. She is well-developed. She is not diaphoretic.  HENT:     Head: Normocephalic and atraumatic.     Nose: Congestion and rhinorrhea present.     Mouth/Throat:     Pharynx: No posterior oropharyngeal erythema.     Comments: Postnasal drainage Eyes:     General: No scleral icterus.  Right eye: No discharge.        Left eye: No discharge.     Extraocular Movements: Extraocular movements intact.     Conjunctiva/sclera: Conjunctivae normal.     Pupils: Pupils are equal, round, and reactive to light.  Neck:     Thyroid: No thyromegaly.  Cardiovascular:     Rate and Rhythm: Normal rate and regular rhythm.     Pulses: Normal pulses.     Heart sounds: Normal heart sounds. No murmur heard. Pulmonary:     Effort: Pulmonary effort is normal. No respiratory distress.     Breath sounds: Wheezing and rhonchi present.   Musculoskeletal:     Cervical back: Neck supple.     Right lower leg: No edema.     Left lower leg: No edema.  Lymphadenopathy:     Cervical: No cervical adenopathy.  Skin:    General: Skin is warm and dry.     Findings: No rash.  Neurological:     Mental Status: She is alert and oriented to person, place, and time. Mental status is at baseline.  Psychiatric:        Behavior: Behavior normal.        Thought Content: Thought content normal.        Judgment: Judgment normal.      No results found for any visits on 04/27/23.  Assessment & Plan    Abnormal lung sounds Acute cough Chest congestion Nasal congestion Could be due to UTI, bronchitis, possible asthma, pneumonia. Symptoms of congestion, cough, and green nasal discharge for 2-3 days. No fever. Possible postnasal drip causing sore throat. No signs of bacterial infection at this time. We did not witness the cough. -Continue Mucinex to thin mucus and promote expectoration. -Use nasal saline rinse or spray and Flonase (2 puffs, 2 times daily for one week, then 1 puff in each nostril). -Consider antihistamines (Allegra, Claritin, Benadryl at night). -Warm salt gargle for sore throat. -If symptoms do not improve in a couple of days, contact the clinic for possible re-evaluation and consideration of antibiotics.  History of wheezing and shortness of breath, especially during cold weather and when having an upper respiratory infection. No known history of asthma in childhood. -Prescribe Albuterol nebulizer for cough and SOB. -Prescribe Prednisone to decrease wheezing. -Referral to Pulmonology for further evaluation and management. -Schedule follow-up appointment with Dr. Sherrie Mustache in one week. Pt has pneumonia in February of this year, improved on Zpack, tessalon. Has Bronchitis in April of this year x twice, she was prescribed albuterol inhaler, Zpack and prednisone and improved on albuterol nebulizer and prednisone taper after the  second visit. Pt does not fu with [place] or redo imaging as recommended. Today was her third visit/forth visit for respiratory infection for the past 6 mo.  General Health Maintenance: -Encouraged to drink plenty of water. -If symptoms worsen, patient to contact the clinic. Work note was provided  History of recurrent respiratory infections History of recent pneumonia - Ambulatory referral to Pulmonology  No follow-ups on file.      The patient was advised to call back or seek an in-person evaluation if the symptoms worsen or if the condition fails to improve as anticipated.  I discussed the assessment and treatment plan with the patient. The patient was provided an opportunity to ask questions and all were answered. The patient agreed with the plan and demonstrated an understanding of the instructions.  I, Diane Lat, PA-C have reviewed all documentation for this visit.  The documentation on  04/27/23 for the exam, diagnosis, procedures, and orders are all accurate and complete.  Diane Keller, [facility], MMS [facility] [phone] (phone) [phone] (fax)   [facility]

## 2023-05-03 ENCOUNTER — Encounter: Payer: Self-pay | Admitting: Student in an Organized Health Care Education/Training Program

## 2023-05-03 ENCOUNTER — Ambulatory Visit (INDEPENDENT_AMBULATORY_CARE_PROVIDER_SITE_OTHER): Payer: Medicaid Other | Admitting: Student in an Organized Health Care Education/Training Program

## 2023-05-03 VITALS — BP 126/80 | HR 85 | Temp 97.6°F | Ht 67.0 in | Wt 248.0 lb

## 2023-05-03 DIAGNOSIS — J4540 Moderate persistent asthma, uncomplicated: Secondary | ICD-10-CM

## 2023-05-03 MED ORDER — BUDESONIDE-FORMOTEROL FUMARATE 80-4.5 MCG/ACT IN AERO
2.0000 | INHALATION_SPRAY | Freq: Two times a day (BID) | RESPIRATORY_TRACT | 12 refills | Status: AC
Start: 2023-05-03 — End: ?

## 2023-05-03 NOTE — Progress Notes (Signed)
Synopsis: Referred in for asthma by [name], PA-C  Assessment & Plan:   1. Moderate persistent asthma  History of wheezing and recurrent bronchitis consistent with asthma especially given wheeze, family history as well as personal history of eosinophilia.  End expiratory wheezes on exam today are consistent with said diagnosis. 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  Will obtain pulmonary function testing to assess the degree of obstruction and prescribe Symbicort twice daily for control of her asthma.   We did discuss that labetalol could exacerbate bronchial hyperreactivity in patients with asthma though she has been on this for over 13 years and I do not suspect it is contributing to worsening symptoms.  Should her symptoms be difficult to control, will consider asking PCP to switch labetalol. I will also consider allergen testing should symptoms be difficult to control.  - Pulmonary Function Test [facility] Only; Future - budesonide-formoterol (SYMBICORT) 80-4.5 MCG/ACT inhaler; Inhale 2 puffs into the lungs in the morning and at bedtime.  Dispense: 1 each; Refill: 12   Return in about 3 months (around 08/03/2023).  I spent 60 minutes caring for this patient today, including preparing to see the patient, obtaining a medical history , reviewing a separately obtained history, performing a medically appropriate examination and/or evaluation, counseling and educating the patient/family/caregiver, ordering medications, tests, or procedures, and documenting clinical information in the electronic health record  Diane Chute, MD [facility] 05/03/2023 10:11 AM    End of visit medications:  Meds ordered this encounter  Medications   budesonide-formoterol (SYMBICORT) 80-4.5 MCG/ACT inhaler    Sig: Inhale 2 puffs into the lungs in the morning and at bedtime.     Dispense:  1 each    Refill:  12     Current Outpatient Medications:    albuterol (PROVENTIL) (2.5 MG/3ML) 0.083% nebulizer solution, Take 3 mLs (2.5 mg total) by nebulization every 6 (six) hours as needed for wheezing or shortness of breath., Disp: 150 mL, Rfl: 1   albuterol (VENTOLIN HFA) 108 (90 Base) MCG/ACT inhaler, Inhale 1-2 puffs into the lungs every 6 (six) hours as needed for wheezing or shortness of breath., Disp: 54 g, Rfl: 0   amLODipine (NORVASC) 10 MG tablet, TAKE 1 TABLET BY MOUTH EVERY DAY, Disp: 90 tablet, Rfl: 0   budesonide-formoterol (SYMBICORT) 80-4.5 MCG/ACT inhaler, Inhale 2 puffs into the lungs in the morning and at bedtime., Disp: 1 each, Rfl: 12   Buprenorphine HCl-Naloxone HCl 8-2 MG FILM, Place under the tongue 3 (three) times daily., Disp: , Rfl:    labetalol (NORMODYNE) 300 MG tablet, TAKE 1 TABLET BY MOUTH 2 TIMES DAILY., Disp: 180 tablet, Rfl: 0   lisinopril (ZESTRIL) 20 MG tablet, Take 0.5 tablets (10 mg total) by mouth daily., Disp: 90 tablet, Rfl: 1   omeprazole (PRILOSEC) 20 MG capsule, TAKE 1 CAPSULE BY MOUTH EVERY DAY, Disp: 90 capsule, Rfl: 0   Subjective:   PATIENT ID: Diane Keller GENDER: female DOB: 1976/04/06, MRN: [id number]  Chief Complaint  Patient presents with   pulmonary consult    Covid PNA 2020 and PNA 12/2022- completed course of prednisone yesterday for bronchitis. C Chest/head congestion, prod cough with clear to green sputum and wheezing at night.     HPI  Patient is a 47 year old female with a past medical history of hypertension who presents to clinic for the  evaluation of recurrent bronchitis.  Patient has been seen 3-4 times over the past 2 months for shortness of breath, cough, and wheezing.  She reports that the symptoms were sudden in onset and would progress to the point where she would have audible wheezing and a cough productive of sputum.  She has had 3 courses of prednisone which she reports resolved her symptoms  quickly.  She was most recently seen in June at her primary care physician's office where she was again noted to be wheezy and prescribed albuterol as well as prednisone.  Patient denies having had any similar symptoms in the past and does not report having COVID in 2021 that left her feeling more short of breath and resulted in prolonged hospitalization.  She denies any history of asthma or breathing problems growing up.  She has never had to use an inhaler in the past but was prescribed albuterol recently which she feels helps (uses twice a day).  Family history is notable for asthma in her brother.  She does report a history of seasonal allergies and a runny nose that improved significantly with the regimen prescribed by her primary care physician.  Patient also inquiring about whether labetalol is contributing to her symptoms.  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  There are no exposures at work and at [employer] there are no open fires, flames, or barbecue.  She is a non-smoker.  She lives at home and has done so for the past 19 years.  They have a dog that is mostly out side (they let her in for a couple of months late last year but she did not notice any correlation with symptoms).  No mold noted in the house.  Ancillary information including prior medications, full medical/surgical/family/social histories, and PFTs (when available) are listed below and have been reviewed.   Review of Systems  Constitutional:  Negative for chills, fever, malaise/fatigue and weight loss.  Respiratory:  Positive for cough, sputum production, shortness of breath and wheezing. Negative for hemoptysis.   Cardiovascular:  Negative for chest pain.     Objective:   Vitals:   05/03/23 0951  BP: 126/80  Pulse: 85  Temp: 97.6 F (36.4 C)  TempSrc: Temporal  SpO2: 97%  Weight: 248 lb (112.5 kg)  Height: 5\' 7"  (1.702 m)   97% on RA BMI Readings  from Last 3 Encounters:  05/03/23 38.84 kg/m  04/27/23 38.34 kg/m  01/02/23 39.01 kg/m   Wt Readings from Last 3 Encounters:  05/03/23 248 lb (112.5 kg)  04/27/23 244 lb 12.8 oz (111 kg)  01/02/23 249 lb 1.6 oz (113 kg)    Physical Exam Constitutional:      Appearance: She is obese. She is not ill-appearing.  HENT:     Head: Normocephalic.     Mouth/Throat:     Mouth: Mucous membranes are moist.  Cardiovascular:     Rate and Rhythm: Normal rate and regular rhythm.     Pulses: Normal pulses.     Heart sounds: Normal heart sounds.  Pulmonary:     Effort: Pulmonary effort is normal.     Breath sounds: Wheezing (End expiratory) present.  Abdominal:     General: Abdomen is flat.     Palpations: Abdomen is soft.  Neurological:     General: No focal deficit present.     Mental Status: She is alert and oriented to person, place, and time. Mental status  is at baseline.       Ancillary Information    Past Medical History:  Diagnosis Date   Anxiety    GERD (gastroesophageal reflux disease)    Pneumonia due to COVID-19 virus 04/07/2020   Renal failure 03/26/2020   Stroke (HCC) 2018     Family History  Problem Relation Age of Onset   Hypertension Father    Heart attack Father    Cerebrovascular Accident Father      Past Surgical History:  Procedure Laterality Date   CESAREAN SECTION      Social History   Socioeconomic History   Marital status: Married    Spouse name: Not on file   Number of children: Not on file   Years of education: Not on file   Highest education level: Not on file  Occupational History   Occupation: Peer Support    Comment: Ambulance person  Tobacco Use   Smoking status: Former    Types: Cigarettes   Smokeless tobacco: Never   Tobacco comments:    Social   Substance and Sexual Activity   Alcohol use: Yes   Drug use: No   Sexual activity: Yes  Other Topics Concern   Not on file  Social History Narrative   Not on file    Social Determinants of Health   Financial Resource Strain: Not on file  Food Insecurity: Not on file  Transportation Needs: Not on file  Physical Activity: Not on file  Stress: Not on file  Social Connections: Not on file  Intimate Partner Violence: Not on file     Allergies  Allergen Reactions   Augmentin [Amoxicillin-Pot Clavulanate] Itching   Lisinopril Other (See Comments)    Cough     CBC    Component Value Date/Time   WBC 4.0 06/14/2022 1438   WBC 8.7 04/16/2020 0348   RBC 3.72 (L) 06/14/2022 1438   RBC 3.61 (L) 04/16/2020 0348   HGB 10.9 (L) 06/14/2022 1438   HCT 31.7 (L) 06/14/2022 1438   PLT 242 06/14/2022 1438   MCV 85 06/14/2022 1438   MCV 88 05/11/2012 1254   MCH 29.3 06/14/2022 1438   MCH 23.0 (L) 04/16/2020 0348   MCHC 34.4 06/14/2022 1438   MCHC 30.3 04/16/2020 0348   RDW 13.8 06/14/2022 1438   RDW 16.1 (H) 05/11/2012 1254   LYMPHSABS 1.1 06/14/2022 1438   LYMPHSABS 2.8 05/11/2012 1254   MONOABS 0.6 04/16/2020 0348   MONOABS 0.4 05/11/2012 1254   EOSABS 0.5 (H) 06/14/2022 1438   EOSABS 0.6 05/11/2012 1254   BASOSABS 0.1 06/14/2022 1438   BASOSABS 0.1 05/11/2012 1254    Pulmonary Functions Testing Results:     No data to display          Outpatient Medications Prior to Visit  Medication Sig Dispense Refill   albuterol (PROVENTIL) (2.5 MG/3ML) 0.083% nebulizer solution Take 3 mLs (2.5 mg total) by nebulization every 6 (six) hours as needed for wheezing or shortness of breath. 150 mL 1   albuterol (VENTOLIN HFA) 108 (90 Base) MCG/ACT inhaler Inhale 1-2 puffs into the lungs every 6 (six) hours as needed for wheezing or shortness of breath. 54 g 0   amLODipine (NORVASC) 10 MG tablet TAKE 1 TABLET BY MOUTH EVERY DAY 90 tablet 0   Buprenorphine HCl-Naloxone HCl 8-2 MG FILM Place under the tongue 3 (three) times daily.     labetalol (NORMODYNE) 300 MG tablet TAKE 1 TABLET BY MOUTH 2 TIMES DAILY. 180  tablet 0   lisinopril (ZESTRIL) 20 MG tablet  Take 0.5 tablets (10 mg total) by mouth daily. 90 tablet 1   omeprazole (PRILOSEC) 20 MG capsule TAKE 1 CAPSULE BY MOUTH EVERY DAY 90 capsule 0   predniSONE (STERAPRED UNI-PAK 21 TAB) 10 MG (21) TBPK tablet Take by mouth daily. As directed 21 tablet 0   No facility-administered medications prior to visit.

## 2023-06-17 ENCOUNTER — Other Ambulatory Visit: Payer: Self-pay | Admitting: Family Medicine

## 2023-06-17 DIAGNOSIS — I10 Essential (primary) hypertension: Secondary | ICD-10-CM

## 2023-06-19 NOTE — Telephone Encounter (Signed)
Requested Prescriptions  Pending Prescriptions Disp Refills   labetalol (NORMODYNE) 300 MG tablet [Pharmacy Med Name: LABETALOL HCL 300 MG TABLET] 180 tablet 0    Sig: TAKE 1 TABLET BY MOUTH TWICE A DAY     Cardiovascular:  Beta Blockers Passed - 06/17/2023  2:32 PM      Passed - Last BP in normal range    BP Readings from Last 1 Encounters:  05/03/23 126/80         Passed - Last Heart Rate in normal range    Pulse Readings from Last 1 Encounters:  05/03/23 85         Passed - Valid encounter within last 6 months    Recent Outpatient Visits           1 month ago Abnormal lung sounds   [place] [facility] [place], [place], PA-C   5 months ago Abnormal lung sounds   [place] [facility] [place], [place], PA-C   1 year ago Iron deficiency anemia due to chronic blood loss   [place] [facility] [place], Darl Householder, DO   1 year ago Primary hypertension   [place] [facility] Malva Limes, MD   3 years ago Pneumonia due to COVID-19 virus   [facility] Malva Limes, MD       Future Appointments             In 1 month Raechel Chute, MD [facility] at [facility]

## 2023-07-26 ENCOUNTER — Other Ambulatory Visit: Payer: Self-pay | Admitting: Family Medicine

## 2023-08-08 ENCOUNTER — Ambulatory Visit: Payer: Medicaid Other

## 2023-08-08 ENCOUNTER — Other Ambulatory Visit: Payer: Self-pay | Admitting: Family Medicine

## 2023-08-08 DIAGNOSIS — R12 Heartburn: Secondary | ICD-10-CM

## 2023-08-08 NOTE — Telephone Encounter (Signed)
Requested Prescriptions  Pending Prescriptions Disp Refills   omeprazole (PRILOSEC) 20 MG capsule [Pharmacy Med Name: OMEPRAZOLE DR 20 MG CAPSULE] 90 capsule 0    Sig: TAKE 1 CAPSULE BY MOUTH EVERY DAY     Gastroenterology: Proton Pump Inhibitors Passed - 08/08/2023  7:29 AM      Passed - Valid encounter within last 12 months    Recent Outpatient Visits           3 months ago Abnormal lung sounds   [place] [facility] [place], [place], PA-C   7 months ago Abnormal lung sounds   [place] [facility] [place], [place], PA-C   1 year ago Iron deficiency anemia due to chronic blood loss   [facility] [place], Darl Householder, DO   1 year ago Primary hypertension   [place] [facility] Malva Limes, MD   3 years ago Pneumonia due to COVID-19 virus   [facility] Malva Limes, MD       Future Appointments             In 3 weeks Raechel Chute, MD [facility] at [facility]

## 2023-08-09 ENCOUNTER — Ambulatory Visit: Payer: Medicaid Other | Admitting: Student in an Organized Health Care Education/Training Program

## 2023-08-22 ENCOUNTER — Ambulatory Visit
Admission: EM | Admit: 2023-08-22 | Discharge: 2023-08-22 | Disposition: A | Discharge status: Home / Self Care | Payer: Medicaid Other | Attending: Emergency Medicine | Admitting: Emergency Medicine

## 2023-08-22 DIAGNOSIS — J0100 Acute maxillary sinusitis, unspecified: Secondary | ICD-10-CM

## 2023-08-22 MED ORDER — PREDNISONE 20 MG PO TABS: 40.0000 mg | ORAL_TABLET | Freq: Every day | ORAL | 0 refills | Status: AC

## 2023-08-22 MED ORDER — IPRATROPIUM BROMIDE 0.03 % NA SOLN: 2.0000 | Freq: Two times a day (BID) | NASAL | 12 refills | Status: AC

## 2023-08-22 MED ORDER — AZITHROMYCIN 250 MG PO TABS: 250.0000 mg | ORAL_TABLET | Freq: Every day | ORAL | 0 refills | Status: DC

## 2023-08-22 NOTE — ED Provider Notes (Signed)
Diane Keller    CSN: [id number] Arrival date & time: 08/22/23  1808      History   Chief Complaint Chief Complaint  Patient presents with   Nasal Congestion    HPI Diane Keller is a 47 y.o. female.   Patient presents for evaluation of nasal congestion, rhinorrhea, sinus pain and pressure primarily on the left side present for 2 to 3 days.  Has begun to experience left-sided facial pressure, symptoms worsening.  Associated mild nonproductive cough.  No known sick contacts prior.  Denies presence of fever.  Tolerating food and liquids.  Has attempted use of over-the-counter analgesic which has provided minimal relief.  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 Authorizing Provider  azithromycin (ZITHROMAX) 250 MG tablet Take 1 tablet (250 mg total) by mouth daily. Take first 2 tablets together, then 1 every day until finished. 08/22/23  Yes Jamespaul Secrist R, NP  ipratropium (ATROVENT) 0.03 % nasal spray Place 2 sprays into both nostrils every 12 (twelve) hours. 08/22/23  Yes Kashara Blocher R, NP  predniSONE (DELTASONE) 20 MG  tablet Take 2 tablets (40 mg total) by mouth daily. 08/22/23  Yes Karletta Millay R, NP  albuterol (PROVENTIL) (2.5 MG/3ML) 0.083% nebulizer solution Take 3 mLs (2.5 mg total) by nebulization every 6 (six) hours as needed for wheezing or shortness of breath. 04/27/23   Ostwalt, Edmon Crape, PA-C  albuterol (VENTOLIN HFA) 108 (90 Base) MCG/ACT inhaler Inhale 1-2 puffs into the lungs every 6 (six) hours as needed for wheezing or shortness of breath. 02/17/23   Mickie Bail, NP  amLODipine (NORVASC) 10 MG tablet TAKE 1 TABLET BY MOUTH EVERY DAY 07/27/23   Malva Limes, MD  budesonide-formoterol [facility]) 80-4.5 MCG/ACT inhaler Inhale 2 puffs into the lungs in the morning and at bedtime. 05/03/23   Raechel Chute, MD  Buprenorphine HCl-Naloxone HCl 8-2 MG FILM Place under the tongue 3 (three) times daily. 02/11/22   [provider]  labetalol (NORMODYNE) 300 MG tablet TAKE 1 TABLET BY MOUTH TWICE A DAY 06/19/23   Malva Limes, MD  lisinopril (ZESTRIL) 20 MG tablet Take 0.5 tablets (10 mg total) by mouth daily. 06/14/22   Caro Laroche, DO  omeprazole (PRILOSEC) 20 MG capsule TAKE 1 CAPSULE BY  MOUTH EVERY DAY 08/08/23   Malva Limes, MD    Family History Family History  Problem Relation Age of Onset   Hypertension Father    Heart attack Father    Cerebrovascular Accident Father     Social History Social History   Tobacco Use   Smoking status: Former    Types: Cigarettes   Smokeless tobacco: Never   Tobacco comments:    Social   Substance Use Topics   Alcohol use: Yes   Drug use: No     Allergies   Augmentin [amoxicillin-pot clavulanate] and Lisinopril   Review of Systems Review of Systems   Physical Exam Triage Vital Signs ED Triage Vitals  Encounter Vitals Group     BP 08/22/23 1910 (!) 133/90     Systolic BP Percentile --      Diastolic BP Percentile --      Pulse Rate 08/22/23 1910 64     Resp 08/22/23 1910 18     Temp 08/22/23 1910 98.1 F (36.7 C)      Temp Source 08/22/23 1910 Oral     SpO2 08/22/23 1910 97 %     Weight --      Height --      Head Circumference --      Peak Flow --      Pain Score 08/22/23 1856 9     Pain Loc --      Pain Education --      Exclude from Growth Chart --    No data found.  Updated Vital Signs BP (!) 133/90 (BP Location: Right Arm)   Pulse 64   Temp 98.1 F (36.7 C) (Oral)   Resp 18   LMP 08/15/2023   SpO2 97%   Visual Acuity Right Eye Distance:   Left Eye Distance:   Bilateral Distance:    Right Eye Near:   Left Eye Near:    Bilateral Near:     Physical Exam Constitutional:      Appearance: Normal appearance.  HENT:     Head: Normocephalic.     Right Ear: Tympanic membrane, ear canal and external ear normal.     Left Ear: Tympanic membrane, ear canal and external ear normal.     Nose: Congestion and rhinorrhea present.     Left Turbinates: Swollen.     Left Sinus: Maxillary sinus tenderness present. No frontal sinus tenderness.     Comments: Moderate left-sided facial swelling primarily along the cheek, copious amounts of nasal drainage    Mouth/Throat:     Pharynx: No oropharyngeal exudate or posterior oropharyngeal erythema.  Eyes:     Extraocular Movements: Extraocular movements intact.  Cardiovascular:     Rate and Rhythm: Normal rate and regular rhythm.     Pulses: Normal pulses.     Heart sounds: Normal heart sounds.  Pulmonary:     Effort: Pulmonary effort is normal.     Breath sounds: Normal breath sounds.  Musculoskeletal:     Cervical back: Normal range of motion.  Lymphadenopathy:     Cervical: Cervical adenopathy present.  Skin:    General: Skin is warm and dry.  Neurological:     Mental Status: She is alert and oriented to person, place, and time. Mental status is at baseline.      UC Treatments / Results  Labs (all labs ordered are listed, but only abnormal results are displayed) Labs Reviewed - No data to display  EKG   Radiology  No results  found.  Procedures Procedures (including critical care time)  Medications Ordered in UC Medications - No data to display  Initial Impression / Assessment and Plan / UC Course  I have reviewed the triage vital signs and the nursing notes.  Pertinent labs & imaging results that were available during my care of the patient were reviewed by me and considered in my medical decision making (see chart for details).  Acute nonrecurrent maxillary sinusitis  Vital signs are stable patient is in no signs of distress nontoxic-appearing, significant nasal drainage and nasal swelling within the left turbinate on exam, has facial swelling along the cheek as well as sinus tenderness, due to extent of swelling we will prophylactically provide bacterial coverage as well as prednisone in efforts to reduce symptoms, prescribed Atrovent nasal spray to help clear sinuses and recommended over-the-counter mucolytic's and decongestants to be used additionally, discussed additional supportive measures with follow-up as needed Final Clinical Impressions(s) / UC Diagnoses   Final diagnoses:  Acute non-recurrent maxillary sinusitis     Discharge Instructions      Today you are being treated for a sinus infection and due to the significant swelling on exam you are going to be started on antibiotics  Begin azithromycin as directed  Starting tomorrow take prednisone every morning with food for 5 days to reduce swelling within the sinuses and ideally provide some comfort  May use Atrovent nasal spray every morning and every evening to further help reduce congestion within the sinuses    You can take Tylenol and/or Ibuprofen as needed for fever reduction and pain relief.   For cough: honey 1/2 to 1 teaspoon (you can dilute the honey in water or another fluid).  You can also use guaifenesin and dextromethorphan for cough. You can use a humidifier for chest congestion and cough.  If you don't have a humidifier, you  can sit in the bathroom with the hot shower running.      For sore throat: try warm salt water gargles, cepacol lozenges, throat spray, warm tea or water with lemon/honey, popsicles or ice, or OTC cold relief medicine for throat discomfort.   For congestion: take a daily anti-histamine like Zyrtec, Claritin, and a oral decongestant, such as pseudoephedrine.  You can also use Flonase 1-2 sprays in each nostril daily.   It is important to stay hydrated: drink plenty of fluids (water, gatorade/powerade/pedialyte, juices, or teas) to keep your throat moisturized and help further relieve irritation/discomfort.    ED Prescriptions     Medication Sig Dispense Auth. Provider   azithromycin (ZITHROMAX) 250 MG tablet Take 1 tablet (250 mg total) by mouth daily. Take first 2 tablets together, then 1 every day until finished. 6 tablet Janicia Monterrosa R, NP   predniSONE (DELTASONE) 20 MG tablet Take 2 tablets (40 mg total) by mouth daily. 10 tablet Yzabelle Calles R, NP   ipratropium (ATROVENT) 0.03 % nasal spray Place 2 sprays into both nostrils every 12 (twelve) hours. 30 mL Valinda Hoar, NP      PDMP not reviewed this encounter.   Valinda Hoar, NP 08/22/23 [phone]

## 2023-08-22 NOTE — ED Triage Notes (Signed)
Patient to Urgent Care with complaints of nasal congestion. Reports sinus swelling and pain. Denies any known fevers.   Symptoms started 2-3 days ago.  Has been taking Benadryl.

## 2023-08-22 NOTE — Discharge Instructions (Addendum)
Today you are being treated for a sinus infection and due to the significant swelling on exam you are going to be started on antibiotics  Begin azithromycin as directed  Starting tomorrow take prednisone every morning with food for 5 days to reduce swelling within the sinuses and ideally provide some comfort  May use Atrovent nasal spray every morning and every evening to further help reduce congestion within the sinuses    You can take Tylenol and/or Ibuprofen as needed for fever reduction and pain relief.   For cough: honey 1/2 to 1 teaspoon (you can dilute the honey in water or another fluid).  You can also use guaifenesin and dextromethorphan for cough. You can use a humidifier for chest congestion and cough.  If you don't have a humidifier, you can sit in the bathroom with the hot shower running.      For sore throat: try warm salt water gargles, cepacol lozenges, throat spray, warm tea or water with lemon/honey, popsicles or ice, or OTC cold relief medicine for throat discomfort.   For congestion: take a daily anti-histamine like Zyrtec, Claritin, and a oral decongestant, such as pseudoephedrine.  You can also use Flonase 1-2 sprays in each nostril daily.   It is important to stay hydrated: drink plenty of fluids (water, gatorade/powerade/pedialyte, juices, or teas) to keep your throat moisturized and help further relieve irritation/discomfort.

## 2023-08-24 ENCOUNTER — Ambulatory Visit: Payer: Medicaid Other | Attending: Student in an Organized Health Care Education/Training Program

## 2023-08-30 ENCOUNTER — Ambulatory Visit: Payer: Medicaid Other | Admitting: Student in an Organized Health Care Education/Training Program

## 2023-10-24 ENCOUNTER — Other Ambulatory Visit: Payer: Self-pay | Admitting: Family Medicine

## 2023-10-24 DIAGNOSIS — I10 Essential (primary) hypertension: Secondary | ICD-10-CM

## 2023-10-25 ENCOUNTER — Other Ambulatory Visit: Payer: Self-pay | Admitting: Family Medicine

## 2023-10-25 DIAGNOSIS — I10 Essential (primary) hypertension: Secondary | ICD-10-CM

## 2023-10-25 MED ORDER — LABETALOL HCL 300 MG PO TABS
300.0000 mg | ORAL_TABLET | Freq: Two times a day (BID) | ORAL | 0 refills | Status: DC
Start: 2023-10-25 — End: 2023-12-09

## 2023-10-25 NOTE — Telephone Encounter (Signed)
Call to patient- notified she needs appointment- she is at work and will call back- patient states she does not need amlodipine- just labetalol  Requested Prescriptions  Pending Prescriptions Disp Refills   labetalol (NORMODYNE) 300 MG tablet 60 tablet 0    Sig: Take 1 tablet (300 mg total) by mouth 2 (two) times daily.     There is no refill protocol information for this order    Refused Prescriptions Disp Refills   amLODipine (NORVASC) 10 MG tablet [Pharmacy Med Name: AMLODIPINE BESYLATE 10 MG TAB] 90 tablet 0    Sig: TAKE 1 TABLET BY MOUTH EVERY DAY     Cardiovascular: Calcium Channel Blockers 2 Failed - 10/24/2023 12:32 PM      Failed - Last BP in normal range    BP Readings from Last 1 Encounters:  08/22/23 (!) 133/90         Passed - Last Heart Rate in normal range    Pulse Readings from Last 1 Encounters:  08/22/23 64         Passed - Valid encounter within last 6 months    Recent Outpatient Visits           6 months ago Abnormal lung sounds   [place] [facility] [place], [place], PA-C   9 months ago Abnormal lung sounds   [place] [facility] [place], [place], PA-C   1 year ago Iron deficiency anemia due to chronic blood loss   [facility] [place], Darl Householder, DO   1 year ago Primary hypertension    [facility] Malva Limes, MD   3 years ago Pneumonia due to COVID-19 virus   [facility] Sherrie Mustache, Demetrios Isaacs, MD

## 2023-12-08 ENCOUNTER — Other Ambulatory Visit: Payer: Self-pay | Admitting: Family Medicine

## 2023-12-08 DIAGNOSIS — I10 Essential (primary) hypertension: Secondary | ICD-10-CM

## 2023-12-08 NOTE — Telephone Encounter (Signed)
Requested medication (s) are due for refill today: yes  Requested medication (s) are on the active medication list: yes  Last refill:  10/25/23  Future visit scheduled: yes  Notes to clinic:  Unable to refill per protocol, courtesy refill already given, routing for provider approval.      Requested Prescriptions  Pending Prescriptions Disp Refills   labetalol (NORMODYNE) 300 MG tablet [Pharmacy Med Name: LABETALOL HCL 300 MG TABLET] 180 tablet 1    Sig: TAKE 1 TABLET BY MOUTH 2 TIMES DAILY.     Cardiovascular:  Beta Blockers Failed - 12/08/2023  1:52 PM      Failed - Last BP in normal range    BP Readings from Last 1 Encounters:  08/22/23 (!) 133/90         Failed - Valid encounter within last 6 months    Recent Outpatient Visits           7 months ago Abnormal lung sounds   [place] [facility] [place], [place], PA-C   11 months ago Abnormal lung sounds   [place] [facility] [place], [place], PA-C   1 year ago Iron deficiency anemia due to chronic blood loss   [facility] [place], Darl Householder, DO   2 years ago Primary hypertension   [place] [facility] Malva Limes, MD   3 years ago Pneumonia due to COVID-19 virus   [facility] Malva Limes, MD              Passed - Last Heart Rate in normal range    Pulse Readings from Last 1 Encounters:  08/22/23 64

## 2023-12-09 ENCOUNTER — Other Ambulatory Visit: Payer: Self-pay | Admitting: Family Medicine

## 2023-12-09 DIAGNOSIS — I10 Essential (primary) hypertension: Secondary | ICD-10-CM

## 2023-12-11 NOTE — Telephone Encounter (Signed)
Requested Prescriptions  Refused Prescriptions Disp Refills   labetalol (NORMODYNE) 300 MG tablet [Pharmacy Med Name: LABETALOL HCL 300 MG TABLET] 180 tablet     Sig: TAKE 1 TABLET BY MOUTH TWICE A DAY     Cardiovascular:  Beta Blockers Failed - 12/11/2023  3:09 PM      Failed - Last BP in normal range    BP Readings from Last 1 Encounters:  08/22/23 (!) 133/90         Failed - Valid encounter within last 6 months    Recent Outpatient Visits           7 months ago Abnormal lung sounds   [place] [facility] [place], [place], PA-C   11 months ago Abnormal lung sounds   [facility] [place], [place], PA-C   1 year ago Iron deficiency anemia due to chronic blood loss   [facility] [place], Darl Householder, DO   2 years ago Primary hypertension   [facility] Malva Limes, MD   3 years ago Pneumonia due to COVID-19 virus   [facility] Malva Limes, MD              Passed - Last Heart Rate in normal range    Pulse Readings from Last 1 Encounters:  08/22/23 64

## 2024-03-07 ENCOUNTER — Other Ambulatory Visit: Payer: Self-pay | Admitting: Family Medicine

## 2024-03-07 DIAGNOSIS — I10 Essential (primary) hypertension: Secondary | ICD-10-CM

## 2024-04-03 ENCOUNTER — Other Ambulatory Visit: Payer: Self-pay | Admitting: Family Medicine

## 2024-04-03 DIAGNOSIS — R12 Heartburn: Secondary | ICD-10-CM

## 2024-05-01 ENCOUNTER — Other Ambulatory Visit: Payer: Self-pay | Admitting: Family Medicine

## 2024-05-01 DIAGNOSIS — R12 Heartburn: Secondary | ICD-10-CM

## 2024-05-31 ENCOUNTER — Other Ambulatory Visit: Payer: Self-pay | Admitting: Family Medicine

## 2024-05-31 DIAGNOSIS — I10 Essential (primary) hypertension: Secondary | ICD-10-CM

## 2024-09-04 ENCOUNTER — Other Ambulatory Visit: Payer: Self-pay | Admitting: Family Medicine

## 2024-09-04 DIAGNOSIS — R12 Heartburn: Secondary | ICD-10-CM

## 2024-10-02 ENCOUNTER — Other Ambulatory Visit: Payer: Self-pay | Admitting: Family Medicine

## 2024-10-02 DIAGNOSIS — R12 Heartburn: Secondary | ICD-10-CM

## 2024-11-04 ENCOUNTER — Ambulatory Visit: Payer: Self-pay

## 2024-11-04 NOTE — Telephone Encounter (Signed)
" ° ° ° °  Copied from CRM #[id number]. Topic: Clinical - Red Word Triage >> Nov 04, 2024  2:43 PM Jasmin G wrote: Red Word that prompted transfer to Nurse Triage: Pt initially called to schedule an appt to get her bp med changed as she states that she experiences high bp even with the med. I offered next available with her PCP on Jan, pt requested to speak to NT to possibly get something sooner. Answer Assessment - Initial Assessment Questions Pt states that her BP has been higher and she is out of medications. She states that when her BP is a little higher she takes an extra dose so she is out of the medication. Rn asked how often she is checking bps, she states only at the doctors or therapy appts. She  states it has been as high as 150's/110's. She takes amlodipine  once a day and labetolol twice a day. She states she has a bp cuff at home. Before Rn can ask any further questions, she states she was on her break and has to go back to work. Requested that the medication be sent in but that she will call back after she gets off work to schedule an appt.   Triage not completed.   1. BLOOD PRESSURE: What is your blood pressure? Did you take at least two measurements 5 minutes apart?      2. ONSET: When did you take your blood pressure?      3. HOW: How did you take your blood pressure? (e.g., automatic home BP monitor, visiting nurse)      4. HISTORY: Do you have a history of high blood pressure?      5. MEDICINES: Are you taking any medicines for blood pressure? Have you missed any doses recently?    6. OTHER SYMPTOMS: Do you have any symptoms? (e.g., blurred vision, chest pain, difficulty breathing, headache, weakness)      7. PREGNANCY: Is there any chance you are pregnant? When was your last menstrual period?  Protocols used: Blood Pressure - High-A-AH  "

## 2024-11-05 NOTE — Telephone Encounter (Signed)
 Pt needs to schedule an appointment. Last fill was only a courtesy refill, pt has not establish care only been seen more for acute visits.

## 2024-11-05 NOTE — Telephone Encounter (Signed)
 Spoke with pt and advised she needs to establish care. She has not seen dr. Gasper in over 3 years and has only seen janna for acute visit. Pt stated her therapist advised for her to contact us  to changed her BP medication since it has not been working and has been on it for 13  years may need a different one, its been over a year since her last visit. Pt stated she busy and has to work so she can not make it in, and I advised we can not send in any more medication until being seen by a provider. Tried to schedule an appt but pt stated she will call back later to see what works best for her.

## 2024-11-09 ENCOUNTER — Ambulatory Visit: Admission: EM | Admit: 2024-11-09 | Discharge: 2024-11-09

## 2024-11-09 ENCOUNTER — Other Ambulatory Visit: Payer: Self-pay

## 2024-11-09 ENCOUNTER — Emergency Department

## 2024-11-09 ENCOUNTER — Emergency Department
Admission: EM | Admit: 2024-11-09 | Discharge: 2024-11-09 | Disposition: A | Discharge status: Home / Self Care | Attending: Emergency Medicine | Admitting: Emergency Medicine

## 2024-11-09 DIAGNOSIS — R930 Abnormal findings on diagnostic imaging of skull and head, not elsewhere classified: Secondary | ICD-10-CM | POA: Insufficient documentation

## 2024-11-09 DIAGNOSIS — I10 Essential (primary) hypertension: Secondary | ICD-10-CM | POA: Diagnosis not present

## 2024-11-09 DIAGNOSIS — J4540 Moderate persistent asthma, uncomplicated: Secondary | ICD-10-CM | POA: Insufficient documentation

## 2024-11-09 DIAGNOSIS — Z79899 Other long term (current) drug therapy: Secondary | ICD-10-CM | POA: Diagnosis not present

## 2024-11-09 DIAGNOSIS — I160 Hypertensive urgency: Secondary | ICD-10-CM | POA: Diagnosis not present

## 2024-11-09 DIAGNOSIS — J189 Pneumonia, unspecified organism: Secondary | ICD-10-CM | POA: Diagnosis not present

## 2024-11-09 DIAGNOSIS — R059 Cough, unspecified: Secondary | ICD-10-CM | POA: Diagnosis present

## 2024-11-09 DIAGNOSIS — J205 Acute bronchitis due to respiratory syncytial virus: Secondary | ICD-10-CM | POA: Diagnosis not present

## 2024-11-09 DIAGNOSIS — Z8673 Personal history of transient ischemic attack (TIA), and cerebral infarction without residual deficits: Secondary | ICD-10-CM

## 2024-11-09 LAB — CBC
HCT: 37 % (ref 36.0–46.0)
Hemoglobin: 12.3 g/dL (ref 12.0–15.0)
MCH: 29.2 pg (ref 26.0–34.0)
MCHC: 33.2 g/dL (ref 30.0–36.0)
MCV: 87.9 fL (ref 80.0–100.0)
Platelets: 242 K/uL (ref 150–400)
RBC: 4.21 MIL/uL (ref 3.87–5.11)
RDW: 13.5 % (ref 11.5–15.5)
WBC: 5.6 K/uL (ref 4.0–10.5)
nRBC: 0 % (ref 0.0–0.2)

## 2024-11-09 LAB — BASIC METABOLIC PANEL WITH GFR
Anion gap: 11 (ref 5–15)
BUN: 16 mg/dL (ref 6–20)
CO2: 24 mmol/L (ref 22–32)
Calcium: 9.1 mg/dL (ref 8.9–10.3)
Chloride: 106 mmol/L (ref 98–111)
Creatinine, Ser: 0.95 mg/dL (ref 0.44–1.00)
GFR, Estimated: >60 mL/min
Glucose, Bld: 97 mg/dL (ref 70–99)
Potassium: 3.8 mmol/L (ref 3.5–5.1)
Sodium: 142 mmol/L (ref 135–145)

## 2024-11-09 LAB — RESP PANEL BY RT-PCR (RSV, FLU A&B, COVID)  RVPGX2
Influenza A by PCR: NEGATIVE
Influenza B by PCR: NEGATIVE
Resp Syncytial Virus by PCR: POSITIVE — AB
SARS Coronavirus 2 by RT PCR: NEGATIVE

## 2024-11-09 MED ORDER — ACETAMINOPHEN 500 MG PO TABS: 1000.0000 mg | ORAL_TABLET | Freq: Once | ORAL | Status: DC

## 2024-11-09 MED ORDER — ALBUTEROL SULFATE (2.5 MG/3ML) 0.083% IN NEBU
2.5000 mg | INHALATION_SOLUTION | Freq: Once | RESPIRATORY_TRACT | Status: AC
  Administered 2024-11-09: 2.5 mg via RESPIRATORY_TRACT
  Filled 2024-11-09: qty 3

## 2024-11-09 MED ORDER — LABETALOL HCL 200 MG PO TABS
300.0000 mg | ORAL_TABLET | Freq: Once | ORAL | Status: AC
  Administered 2024-11-09: 300 mg via ORAL
  Filled 2024-11-09: qty 1

## 2024-11-09 MED ORDER — AZITHROMYCIN 250 MG PO TABS: ORAL_TABLET | ORAL | 0 refills | Status: AC

## 2024-11-09 MED ORDER — LABETALOL HCL 300 MG PO TABS: 300.0000 mg | ORAL_TABLET | Freq: Two times a day (BID) | ORAL | 0 refills | Status: AC

## 2024-11-09 MED ORDER — AZITHROMYCIN 500 MG PO TABS
500.0000 mg | ORAL_TABLET | Freq: Once | ORAL | Status: AC
  Administered 2024-11-09: 500 mg via ORAL
  Filled 2024-11-09: qty 1

## 2024-11-09 MED ORDER — CLONIDINE HCL 0.1 MG PO TABS
0.2000 mg | ORAL_TABLET | Freq: Once | ORAL | Status: AC
  Administered 2024-11-09: 0.2 mg via ORAL
  Filled 2024-11-09: qty 2

## 2024-11-09 MED ORDER — LABETALOL HCL 5 MG/ML IV SOLN
10.0000 mg | Freq: Once | INTRAVENOUS | Status: DC
  Filled 2024-11-09: qty 4

## 2024-11-09 MED ORDER — AMLODIPINE BESYLATE 5 MG PO TABS
10.0000 mg | ORAL_TABLET | Freq: Once | ORAL | Status: AC
  Administered 2024-11-09: 10 mg via ORAL
  Filled 2024-11-09: qty 2

## 2024-11-09 MED ORDER — AMLODIPINE BESYLATE 10 MG PO TABS: 10.0000 mg | ORAL_TABLET | Freq: Every day | ORAL | 0 refills | Status: AC

## 2024-11-09 NOTE — ED Provider Notes (Signed)
 "  [facility] Provider Note    Event Date/Time   First MD Initiated Contact with Patient 11/09/24 1656     (approximate)   History   No chief complaint on file.   HPI  Diane Keller is a 48 y.o. female with a history of hypertension who presents primarily for elevated blood pressure.  The patient states that she ran out of her blood pressure medication 3 days ago.  She is on amlodipine  and labetalol .  She was able to take a half dose of the nadolol yesterday.  She states that the soonest appointment she was able to go with her primary care provider was on 1/9.  In addition she reports 4 days of nasal congestion, nonproductive cough, mild shortness of breath, and bodyaches.  She denies any chest pain or headache.  She has no vomiting or diarrhea.  Reviewed the past medical records.  The patient's most recent outpatient visit was with [place] [facility] in June of last year for evaluation of moderate persistent asthma.   Physical Exam   Triage Vital Signs: ED Triage Vitals  Encounter Vitals Group     BP 11/09/24 1512 (!) 230/139     Girls Systolic BP Percentile --      Girls Diastolic BP Percentile --      Boys Systolic BP Percentile --      Boys Diastolic BP Percentile --      Pulse Rate 11/09/24 1507 92     Resp 11/09/24 1507 16     Temp 11/09/24 1507 98.3 F (36.8 C)     Temp Source 11/09/24 1507 Oral     SpO2 11/09/24 1507 95 %     Weight 11/09/24 1508 250 lb (113.4 kg)     Height 11/09/24 1508 5' 6 (1.676 m)     Head Circumference --      Peak Flow --      Pain Score 11/09/24 1508 0     Pain Loc --      Pain Education --      Exclude from Growth Chart --     Most recent vital signs: Vitals:   11/09/24 1802 11/09/24 1915  BP: (!) 232/114 (!) 219/112  Pulse:    Resp:  18  Temp:  98.2 F (36.8 C)  SpO2:       General: Alert, well-appearing, no distress.  CV:  Good peripheral perfusion.  Normal heart sounds. Resp:  Normal effort.   Lungs CTAB. Abd:  No distention.  Other:  No peripheral edema.   ED Results / Procedures / Treatments   Labs (all labs ordered are listed, but only abnormal results are displayed) Labs Reviewed  RESP PANEL BY RT-PCR (RSV, FLU A&B, COVID)  [id number] - Abnormal; Notable for the following components:      Result Value   Resp Syncytial Virus by PCR POSITIVE (*)    All other components within normal limits  CBC  BASIC METABOLIC PANEL WITH GFR     EKG     RADIOLOGY  Chest x-ray: I independently viewed and interpreted the images; there is a right middle lobe possible opacity, as well as possible left infrahilar opacity.  PROCEDURES:  Critical Care performed: No  Procedures   MEDICATIONS ORDERED IN ED: Medications  cloNIDine  (CATAPRES ) tablet 0.2 mg (0.2 mg Oral Given 11/09/24 1735)  labetalol  (NORMODYNE ) tablet 300 mg (300 mg Oral Given 11/09/24 2004)     IMPRESSION / MDM / ASSESSMENT AND PLAN /  ED COURSE  I reviewed the triage vital signs and the nursing notes.  48 year old female with PMH as noted above presents with elevated blood pressure after running out of her blood pressure medication several days ago, as well as about 5 days of cough and URI symptoms.  On exam she is overall well-appearing.  She is significantly hypertensive but with otherwise normal vital signs.  Physical exam is unremarkable.  Differential diagnosis includes, but is not limited to, chronic hypertension.  Differential for the other symptoms includes viral URI, COVID or other viral syndrome, pneumonia.  Patient's presentation is most consistent with acute complicated illness / injury requiring diagnostic workup.  BMP and CBC show no acute findings.  Respiratory panel is positive for RSV.  Chest x-ray shows possible right middle lobe and questionable left infrahilar infiltrate.  CT head is pending.  I have ordered clonidine  for blood pressure control.  ----------------------------------------- 8:10  PM on 11/09/2024 -----------------------------------------  The blood pressure has not improved with the clonidine .  I ordered a dose of IV labetalol , over the patient has difficult IV access.  Overall I feel like if we can get her blood pressure down she does not need to be admitted.  Therefore I will try another oral medication, the patient's normal dose of her labetalol .  If the blood pressure improves she likely will be stable for discharge.  If she continues to be severely hypertensive she may need admission for further management.  I have signed her out to the oncoming ED providers.   FINAL CLINICAL IMPRESSION(S) / ED DIAGNOSES   Final diagnoses:  Hypertension, unspecified type  RSV bronchitis  Pneumonia due to infectious organism, unspecified laterality, unspecified part of lung     Rx / DC Orders   ED Discharge Orders          Ordered    amLODipine  (NORVASC ) 10 MG tablet  Daily        11/09/24 2008    labetalol  (NORMODYNE ) 300 MG tablet  2 times daily        11/09/24 2008    azithromycin  (ZITHROMAX  Z-PAK) 250 MG tablet        11/09/24 2009             Note:  This document was prepared using Dragon voice recognition software and may include unintentional dictation errors.    Jacolyn Pae, MD 11/09/24 2010  "

## 2024-11-09 NOTE — ED Provider Notes (Signed)
 " Diane Keller    CSN: [id number] Arrival date & time: 11/09/24  1428      History   Chief Complaint Chief Complaint  Patient presents with   Hypertension   Nasal Congestion    HPI Diane Keller is a 48 y.o. female.  Patient presents with 3-4 day history of congestion and cough.  She has been treating her symptoms with Mucinex and Benadryl .  Patient also requests a refill of her blood pressure medication; she states she last took 1 tablet of labetalol  yesterday but did not take the second dose yesterday because she is completely out.  No fever, chest pain, shortness of breath, focal weakness.  Her medical history includes hypertension, stroke, morbid obesity.  The history is provided by the patient and medical records.    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 Authorizing Provider  albuterol  (PROVENTIL ) (2.5 MG/3ML) 0.083% nebulizer solution Take 3 mLs (2.5 mg total) by nebulization every 6 (six) hours as needed for wheezing or shortness of breath. 04/27/23   Ostwalt, Janna, PA-C  albuterol  (VENTOLIN  HFA) 108 (90 Base)  MCG/ACT inhaler Inhale 1-2 puffs into the lungs every 6 (six) hours as needed for wheezing or shortness of breath. 02/17/23   Corlis Burnard DEL, NP  amLODipine  (NORVASC ) 10 MG tablet TAKE 1 TABLET BY MOUTH EVERY DAY 09/05/24   Gasper Nancyann BRAVO, MD  azithromycin  (ZITHROMAX ) 250 MG tablet Take 1 tablet (250 mg total) by mouth daily. Take first 2 tablets together, then 1 every day until finished. 08/22/23   Teresa Shelba SAUNDERS, NP  budesonide -formoterol  (SYMBICORT ) 80-4.5 MCG/ACT inhaler Inhale 2 puffs into the lungs in the morning and at bedtime. 05/03/23   Isadora Hose, MD  Buprenorphine  HCl-Naloxone  HCl 8-2 MG FILM Place under the tongue 3 (three) times daily. 02/11/22   [provider]  ipratropium (ATROVENT ) 0.03 % nasal spray Place 2 sprays into both nostrils every 12 (twelve) hours. 08/22/23   Teresa Shelba SAUNDERS, NP  labetalol  (NORMODYNE ) 300 MG tablet TAKE 1 TABLET BY MOUTH TWICE A DAY 05/31/24   Gasper Nancyann BRAVO, MD  lisinopril  (ZESTRIL ) 20 MG tablet Take 0.5 tablets (10 mg total) by mouth daily. 06/14/22   Madelon Donald HERO, DO  omeprazole  (PRILOSEC) 20 MG capsule TAKE 1 CAPSULE  BY MOUTH EVERY DAY 09/05/24   Gasper Nancyann BRAVO, MD  predniSONE  (DELTASONE ) 20 MG tablet Take 2 tablets (40 mg total) by mouth daily. 08/22/23   Teresa Shelba SAUNDERS, NP    Family History Family History  Problem Relation Age of Onset   Hypertension Father    Heart attack Father    Cerebrovascular Accident Father     Social History Social History[1]   Allergies   Augmentin [amoxicillin-pot clavulanate] and Lisinopril    Review of Systems Review of Systems  Constitutional:  Negative for chills and fever.  HENT:  Positive for congestion. Negative for ear pain and sore throat.   Respiratory:  Positive for cough. Negative for shortness of breath.   Cardiovascular:  Negative for chest pain and palpitations.  Neurological:  Negative for dizziness, weakness and numbness.     Physical Exam Triage Vital Signs ED  Triage Vitals [11/09/24 1438]  Encounter Vitals Group     BP (S) (!) 211/152     Girls Systolic BP Percentile      Girls Diastolic BP Percentile      Boys Systolic BP Percentile      Boys Diastolic BP Percentile      Pulse Rate 86     Resp 16     Temp      Temp src      SpO2 93 %     Weight      Height      Head Circumference      Peak Flow      Pain Score 0     Pain Loc      Pain Education      Exclude from Growth Chart    No data found.  Updated Vital Signs BP (S) (!) 195/141 (BP Location: Left Arm)   Pulse 86   Resp 16   LMP 10/19/2024 (Approximate)   SpO2 93%   Visual Acuity Right Eye Distance:   Left Eye Distance:   Bilateral Distance:    Right Eye Near:   Left Eye Near:    Bilateral Near:     Physical Exam Constitutional:      General: She is not in acute distress.    Appearance: She is obese.  HENT:     Mouth/Throat:     Mouth: Mucous membranes are moist.  Cardiovascular:     Rate and Rhythm: Normal rate and regular rhythm.     Heart sounds: Normal heart sounds.  Pulmonary:     Effort: Pulmonary effort is normal. No respiratory distress.     Breath sounds: Rhonchi present.     Comments: Rhonchi noted bilateral lower lungs. Neurological:     General: No focal deficit present.     Mental Status: She is alert.     Sensory: No sensory deficit.     Motor: No weakness.     Gait: Gait normal.      UC Treatments / Results  Labs (all labs ordered are listed, but only abnormal results are displayed) Labs Reviewed - No data to display  EKG   Radiology No results found.  Procedures Procedures (including critical care time)  Medications Ordered in UC Medications - No data to display  Initial Impression / Assessment and Plan / UC Course  I have reviewed the triage vital signs and the nursing notes.  Pertinent labs & imaging results that were available during my care of the patient were reviewed by me and considered in my medical decision  making (  see chart for details).   Hypertensive urgency, history of stroke.  Blood pressure 211/152 on arrival.  Repeat 195/141.  Patient took half dose of her labetalol  yesterday but is completely out of her blood pressure medication.  She denies chest pain, shortness of breath, focal weakness.  Sending her to the ED for hypertensive urgency and history of stroke.  She is agreeable to this and will go to [facility] ED now.  She declines EMS.  Final Clinical Impressions(s) / UC Diagnoses   Final diagnoses:  Hypertensive urgency  History of stroke     Discharge Instructions      Go to the emergency department now for your extremely high blood pressure: 211/152  Repeat 195/141     ED Prescriptions   None    PDMP not reviewed this encounter.    [1]  Social History Tobacco Use   Smoking status: Former    Types: Cigarettes   Smokeless tobacco: Never   Tobacco comments:    Social   Vaping Use   Vaping status: Never Used  Substance Use Topics   Alcohol use: Yes   Drug use: No     Corlis Burnard DEL, NP 11/09/24 1454  "

## 2024-11-09 NOTE — Discharge Instructions (Signed)
 Go to the emergency department now for your extremely high blood pressure: 211/152  Repeat 195/141

## 2024-11-09 NOTE — ED Provider Notes (Signed)
" °  Physical Exam  BP (!) 219/112 (BP Location: Left Arm)   Pulse 92   Temp 98.2 F (36.8 C) (Oral)   Resp 18   Ht 5' 6 (1.676 m)   Wt 113.4 kg   LMP 10/19/2024 (Approximate)   SpO2 95%   BMI 40.35 kg/m   Physical Exam  Procedures  Procedures  ED Course / MDM    Medical Decision Making Amount and/or Complexity of Data Reviewed Labs: ordered. Radiology: ordered.  Risk Prescription drug management.   Assuming care from Dr. Jacolyn.  Blood pressure was 201/116 on recheck.  Discussed patient with supervising physician, Dr. Dorothyann who recommended she is able to go home as long as she remains asymptomatic.  Evaluated her and she is not having any symptoms including no headache or chest pain at this time.  I will give her a dose of her amlodipine  that she takes at home prior to discharge.  At the repeat blood pressure was 185/115 at the time of discharge.  She was told to return to the emergency department immediately if she becomes symptomatic.  She is to follow-up with her primary care provider as originally planned in early January.     "

## 2024-11-09 NOTE — ED Triage Notes (Signed)
 Patient presents to UC for chest congestion and nasal congestion x 3-4 days ago. Treating with benadryl , mucinex. Also requesting BP med refills.

## 2024-11-09 NOTE — Discharge Instructions (Addendum)
 Follow-up with your primary care provider on 1/9 as planned.  We have prescribed a 1 month supply of your amlodipine  and labetalol  to cover you until you are able to follow-up.  We have also prescribed antibiotics to cover for possible pneumonia.  Return to the ER for persistently elevated blood pressure readings especially over 200 on the top number or 120 on the bottom number, headache, chest pain, difficulty breathing, weakness, or any other new or worsening symptoms or concerning.

## 2024-11-09 NOTE — ED Triage Notes (Addendum)
 Pt to ED for hypertension. Pt states is out of BP meds and BP is high, also having cough and congestion. Cough/congestion started about 4 days ago. Pt attempted to call dr and they couldn't call in refill. Next appt would be 1/9, pt states can't wait that long. Pt denies any pain/SOB, no headache.

## 2024-11-09 NOTE — ED Notes (Signed)
 Patient is being discharged from the Urgent Care and sent to the Emergency Department via POV . Per Burnard Cork NP, patient is in need of higher level of care due to hypertensive urgency. Patient is aware and verbalizes understanding of plan of care.  Vitals:   11/09/24 1438 11/09/24 1440  BP: (S) (!) 211/152 (S) (!) 195/141  Pulse: 86   Resp: 16   SpO2: 93%
